# Patient Record
Sex: Male | Born: 1975
Health system: Southern US, Community
[De-identification: ages and names within clinical notes are randomized; demographics above are authoritative.]

## PROBLEM LIST (undated history)

## (undated) DIAGNOSIS — F321 Major depressive disorder, single episode, moderate: Secondary | ICD-10-CM

## (undated) DIAGNOSIS — G4730 Sleep apnea, unspecified: Secondary | ICD-10-CM

## (undated) DIAGNOSIS — E669 Obesity, unspecified: Secondary | ICD-10-CM

## (undated) DIAGNOSIS — G5601 Carpal tunnel syndrome, right upper limb: Secondary | ICD-10-CM

## (undated) DIAGNOSIS — E785 Hyperlipidemia, unspecified: Secondary | ICD-10-CM

## (undated) DIAGNOSIS — K8020 Calculus of gallbladder without cholecystitis without obstruction: Secondary | ICD-10-CM

## (undated) DIAGNOSIS — Z87442 Personal history of urinary calculi: Secondary | ICD-10-CM

## (undated) DIAGNOSIS — G5600 Carpal tunnel syndrome, unspecified upper limb: Secondary | ICD-10-CM

## (undated) HISTORY — PX: LITHOTRIPSY: SUR834

## (undated) HISTORY — PX: COLONOSCOPY: SHX174

## (undated) HISTORY — DX: Sleep apnea, unspecified: G47.30

## (undated) HISTORY — DX: Hyperlipidemia, unspecified: E78.5

## (undated) HISTORY — DX: Obesity, unspecified: E66.9

## (undated) HISTORY — PX: TRIGGER FINGER RELEASE: SHX641

## (undated) HISTORY — DX: Carpal tunnel syndrome, unspecified upper limb: G56.00

## (undated) HISTORY — DX: Major depressive disorder, single episode, moderate: F32.1

---
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
## 2005-07-21 ENCOUNTER — Emergency Department: Payer: Self-pay | Admitting: Unknown Physician Specialty

## 2006-09-18 ENCOUNTER — Encounter: Admission: RE | Admit: 2006-09-18 | Discharge: 2006-09-18 | Payer: Self-pay | Admitting: Emergency Medicine

## 2007-03-15 ENCOUNTER — Encounter: Payer: Self-pay | Admitting: Family Medicine

## 2007-06-15 ENCOUNTER — Ambulatory Visit: Payer: Self-pay | Admitting: Family Medicine

## 2007-06-15 DIAGNOSIS — Z87442 Personal history of urinary calculi: Secondary | ICD-10-CM

## 2007-06-15 LAB — CONVERTED CEMR LAB
Cholesterol: 203 mg/dL (ref 0–200)
HDL: 42.8 mg/dL (ref >39.0)
Total CHOL/HDL Ratio: 4.7
Triglycerides: 82 mg/dL (ref 0–149)

## 2008-06-13 ENCOUNTER — Ambulatory Visit: Payer: Self-pay | Admitting: Family Medicine

## 2008-06-29 ENCOUNTER — Ambulatory Visit: Payer: Self-pay | Admitting: Family Medicine

## 2008-06-29 LAB — CONVERTED CEMR LAB
Albumin: 4.4 g/dL (ref 3.5–5.2)
Bilirubin, Direct: 0.1 mg/dL (ref 0.0–0.3)
CO2: 33 meq/L — ABNORMAL HIGH (ref 19–32)
Chloride: 101 meq/L (ref 96–112)
Cholesterol: 187 mg/dL (ref 0–200)
HDL: 38.1 mg/dL — ABNORMAL LOW (ref >39.0)
Total CHOL/HDL Ratio: 4.9
Triglycerides: 71 mg/dL (ref 0–149)

## 2008-11-23 ENCOUNTER — Ambulatory Visit: Payer: Self-pay | Admitting: Family Medicine

## 2008-11-23 DIAGNOSIS — J3089 Other allergic rhinitis: Secondary | ICD-10-CM

## 2008-11-23 DIAGNOSIS — J302 Other seasonal allergic rhinitis: Secondary | ICD-10-CM

## 2008-12-21 ENCOUNTER — Telehealth: Payer: Self-pay | Admitting: Family Medicine

## 2009-01-22 ENCOUNTER — Ambulatory Visit: Payer: Self-pay | Admitting: Family Medicine

## 2009-01-23 ENCOUNTER — Ambulatory Visit: Payer: Self-pay | Admitting: Family Medicine

## 2009-01-23 ENCOUNTER — Ambulatory Visit: Payer: Self-pay | Admitting: Cardiology

## 2009-01-23 LAB — CONVERTED CEMR LAB
Albumin: 3.9 g/dL (ref 3.5–5.2)
Basophils Absolute: 0 10*3/uL (ref 0.0–0.1)
CO2: 30 meq/L (ref 19–32)
Chloride: 109 meq/L (ref 96–112)
Eosinophils Absolute: 0 10*3/uL (ref 0.0–0.7)
Eosinophils Relative: 0.1 % (ref 0.0–5.0)
Glucose, Bld: 108 mg/dL — ABNORMAL HIGH (ref 70–99)
HCT: 40.1 % (ref 39.0–52.0)
Ketones, urine, test strip: NEGATIVE
MCHC: 35.4 g/dL (ref 30.0–36.0)
Monocytes Relative: 12.7 % — ABNORMAL HIGH (ref 3.0–12.0)
Neutro Abs: 8 10*3/uL — ABNORMAL HIGH (ref 1.4–7.7)
Neutrophils Relative %: 81 % — ABNORMAL HIGH (ref 43.0–77.0)
Protein, U semiquant: >=300
Rapid Strep: NEGATIVE
Total Protein: 6.9 g/dL (ref 6.0–8.3)
pH: 6

## 2009-01-24 ENCOUNTER — Encounter: Payer: Self-pay | Admitting: Family Medicine

## 2009-04-07 ENCOUNTER — Encounter: Payer: Self-pay | Admitting: Family Medicine

## 2009-06-12 ENCOUNTER — Ambulatory Visit: Payer: Self-pay | Admitting: Family Medicine

## 2009-07-15 ENCOUNTER — Ambulatory Visit: Payer: Self-pay | Admitting: Family Medicine

## 2009-07-15 LAB — CONVERTED CEMR LAB
Bilirubin Urine: NEGATIVE
Casts: 0 /lpf
Glucose, Urine, Semiquant: NEGATIVE
Ketones, urine, test strip: NEGATIVE
Nitrite: NEGATIVE
Specific Gravity, Urine: 1.02
Urobilinogen, UA: 0.2
WBC Urine, dipstick: NEGATIVE
WBC, UA: 0 cells/hpf

## 2009-07-16 ENCOUNTER — Ambulatory Visit: Payer: Self-pay | Admitting: Family Medicine

## 2009-07-18 LAB — CONVERTED CEMR LAB
ALT: 33 units/L (ref 0–53)
BUN: 14 mg/dL (ref 6–23)
Basophils Absolute: 0 10*3/uL (ref 0.0–0.1)
CO2: 33 meq/L — ABNORMAL HIGH (ref 19–32)
Creatinine, Ser: 0.9 mg/dL (ref 0.4–1.5)
Direct LDL: 157.3 mg/dL
GFR calc non Af Amer: 104.51 mL/min (ref >60)
HCT: 41.6 % (ref 39.0–52.0)
Lymphs Abs: 1.6 10*3/uL (ref 0.7–4.0)
MCHC: 34.4 g/dL (ref 30.0–36.0)
Platelets: 297 10*3/uL (ref 150.0–400.0)
Potassium: 4.2 meq/L (ref 3.5–5.1)
Sodium: 142 meq/L (ref 135–145)
VLDL: 17.6 mg/dL (ref 0.0–40.0)

## 2009-09-12 ENCOUNTER — Telehealth: Payer: Self-pay | Admitting: Family Medicine

## 2009-11-20 ENCOUNTER — Telehealth: Payer: Self-pay | Admitting: Family Medicine

## 2010-05-30 ENCOUNTER — Ambulatory Visit: Payer: Self-pay | Admitting: Family Medicine

## 2010-05-30 ENCOUNTER — Telehealth (INDEPENDENT_AMBULATORY_CARE_PROVIDER_SITE_OTHER): Payer: Self-pay | Admitting: *Deleted

## 2010-05-30 DIAGNOSIS — E7800 Pure hypercholesterolemia, unspecified: Secondary | ICD-10-CM | POA: Insufficient documentation

## 2010-05-30 LAB — CONVERTED CEMR LAB
ALT: 30 units/L (ref 0–53)
BUN: 16 mg/dL (ref 6–23)
Bilirubin, Direct: 0.1 mg/dL (ref 0.0–0.3)
Calcium: 9.6 mg/dL (ref 8.4–10.5)
Chloride: 99 meq/L (ref 96–112)
Cholesterol: 233 mg/dL — ABNORMAL HIGH (ref 0–200)
Creatinine, Ser: 0.9 mg/dL (ref 0.4–1.5)
GFR calc non Af Amer: 103.93 mL/min (ref >60)
Glucose, Bld: 84 mg/dL (ref 70–99)
Potassium: 4 meq/L (ref 3.5–5.1)
Total CHOL/HDL Ratio: 4
Total Protein: 7.4 g/dL (ref 6.0–8.3)
VLDL: 20.8 mg/dL (ref 0.0–40.0)

## 2010-06-04 ENCOUNTER — Encounter (INDEPENDENT_AMBULATORY_CARE_PROVIDER_SITE_OTHER): Payer: Self-pay | Admitting: *Deleted

## 2010-06-09 ENCOUNTER — Encounter: Payer: Self-pay | Admitting: Family Medicine

## 2010-06-13 ENCOUNTER — Ambulatory Visit: Payer: Self-pay | Admitting: Family Medicine

## 2010-06-13 DIAGNOSIS — G4726 Circadian rhythm sleep disorder, shift work type: Secondary | ICD-10-CM

## 2010-06-13 DIAGNOSIS — F331 Major depressive disorder, recurrent, moderate: Secondary | ICD-10-CM

## 2010-06-16 ENCOUNTER — Telehealth: Payer: Self-pay | Admitting: Family Medicine

## 2010-06-18 ENCOUNTER — Encounter: Payer: Self-pay | Admitting: Family Medicine

## 2010-07-18 ENCOUNTER — Ambulatory Visit: Payer: Self-pay | Admitting: Family Medicine

## 2010-08-17 DIAGNOSIS — K8020 Calculus of gallbladder without cholecystitis without obstruction: Secondary | ICD-10-CM

## 2010-08-17 HISTORY — PX: CHOLECYSTECTOMY: SHX55

## 2010-08-17 HISTORY — DX: Calculus of gallbladder without cholecystitis without obstruction: K80.20

## 2010-09-16 ENCOUNTER — Telehealth: Payer: Self-pay | Admitting: Family Medicine

## 2010-09-16 NOTE — Progress Notes (Signed)
----   Converted from flag ---- ---- 05/28/2010 12:23 PM, Kerby Nora MD wrote: CMEt, lipids Dx 272.0  ---- 05/28/2010 11:57 AM, Liane Comber CMA (AAMA) wrote: Lab orders please! Good Morning! This pt is scheduled for cpx labs tomorrow, which labs to draw and dx codes to use? Thanks Tasha ------------------------------

## 2010-09-16 NOTE — Letter (Signed)
Summary: Aaron Malone,LPC,Note   Imported By: Beau Fanny 06/11/2010 09:40:16  _____________________________________________________________________  External Attachment:    Type:   Image     Comment:   External Document  Appended Document: Amil Amen Malone,LPC,Note HAs appt 10/28.Marland Kitchenwill discuss mood then.

## 2010-09-16 NOTE — Progress Notes (Signed)
Summary: refill  Phone Note Refill Request Message from:  Scriptline on November 20, 2009 7:25 AM  Refills Requested: Medication #1:  flonase   Supply Requested: 1 month [facility]   Method Requested: Electronic Initial call taken by: Benny Lennert CMA (AAMA),  November 20, 2009 7:25 AM    New/Updated Medications: FLONASE 50 MCG/ACT SUSP (FLUTICASONE PROPIONATE) 2 sprays per nostril daily Prescriptions: FLONASE 50 MCG/ACT SUSP (FLUTICASONE PROPIONATE) 2 sprays per nostril daily  #1 x 3   Entered and Authorized by:   Kerby Nora MD   Signed by:   Kerby Nora MD on 11/20/2009   Method used:   Electronically to        [facility] [address] [phone]* (retail)       [address], [place]  [address]       Ph: [phone]       Fax: [phone]   RxID:   [id number]

## 2010-09-16 NOTE — Miscellaneous (Signed)
Summary: [facility](flu vaccine)  Clinical Lists Changes  Observations: Added new observation of FLU VAX: Historical([facility]) (06/04/2010 14:10)      Immunization History:  Influenza Immunization History:    Influenza:  historical([facility]) (06/04/2010)

## 2010-09-16 NOTE — Progress Notes (Signed)
Summary: prior Berkley Harvey is needed for provigil  Phone Note From Pharmacy   Caller: [facility] [address] Summary of Call: Prior Berkley Harvey is needed for provigil, form is on your desk. Initial call taken by: Lowella Petties CMA, AAMA,  June 16, 2010 3:52 PM     Appended Document: prior Berkley Harvey is needed for provigil Prior auth given for provigil, advised pharmacy.  Approval letter placed on doctor's desk for signature and scanning.

## 2010-09-16 NOTE — Medication Information (Signed)
Summary: Prior Authorization & Approval for Provigil/[facility]  Prior Authorization & Approval for Provigil[facility]   Imported By: Lanelle Bal 06/26/2010 09:51:05  _____________________________________________________________________  External Attachment:    Type:   Image     Comment:   External Document

## 2010-09-16 NOTE — Assessment & Plan Note (Signed)
Summary: CPX / LFW   Vital Signs:  Patient profile:   35 year old male Height:      67 inches Weight:      192.0 pounds BMI:     30.18 Temp:     99.0 degrees F oral Pulse rate:   80 / minute Pulse rhythm:   regular BP sitting:   110 / 80  (left arm) Cuff size:   regular  Vitals Entered By: Benny Lennert CMA Duncan Dull) (June 13, 2010 9:25 AM)  History of Present Illness: Chief complaint cpx  The patient is here for annual wellness exam and preventative care.     Depression, letter from pshycologist.  Ongoing x 2 years , worse in last 6 months.  Fatigue, occ suicidal thought, inattentive, short temper, anheadonia Denies mania. No anxiety, no panic attacks.  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 isolated given working 7 days on at a time, then off. Contributing to mood issues, but cannot change. Weight gain in last few weeks...6 lbs since last year.  Wife has started back to work.  Has 39 year old.   Interested in citalopram.  Father with depression.  Lipid Management History:      Negative NCEP/ATP III risk factors include male age less than 23 years old and non-tobacco-user status.     Problems Prior to Update: 1)  Pure Hypercholesterolemia  (ICD-272.0) 2)  Abdominal Pain, Left Upper Quadrant  (ICD-789.02) 3)  Abdominal Pain, Left Upper Quadrant  (ICD-789.02) 4)  Allergic Rhinitis  (ICD-477.9) 5)  Healthy Adult Male  (ICD-V70.0) 6)  Nephrolithiasis, Hx of  (ICD-V13.01)  Current Medications (verified): 1)  Indapamide 1.25 Mg Tabs (Indapamide) .... Take 1 Tablet By Mouth Once A Day 2)  Flonase 50 Mcg/act Susp (Fluticasone Propionate) .... 2 Sprays Per Nostril Daily(Prn) 3)  Chlorpheniramine Maleate 12 Mg Cr-Tabs (Chlorpheniramine Maleate) .... As Needed  Allergies (verified): No Known Drug Allergies  Past History:  Past medical, surgical, family and social histories (including risk factors) reviewed, and no changes noted (except as noted below).  Past  Medical History: Nephrolithiasis, hx of, calcium stones, high urine calcium urologist- Dr Dawna Part - (in [facility])  Past Surgical History: Reviewed history from 06/15/2007 and no changes required. lithotripsy 12/2003 and 09/2006 1983 B inguinal hernia repair 1981 trigger thumb  Family History: Reviewed history from 06/15/2007 and no changes required. father age 73 healthy mother age 59 high chol, HTN sister healthy (PCOS) MGM: colon cancer, lung cancer MGF: prostate cancer, CHF PGF: multile myeloma PGM: DM  Social History: Reviewed history from 07/15/2009 and no changes required. 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 CV:  Denies chest pain or discomfort. Resp:  Denies shortness of breath.  Physical Exam  General:  overweight appearing male in NAd  Eyes:  No corneal or conjunctival inflammation noted. EOMI. Perrla. Funduscopic exam benign, without hemorrhages, exudates or papilledema. Vision grossly normal. Ears:  External ear exam shows no significant lesions or deformities.  Otoscopic examination reveals clear canals, tympanic membranes are intact bilaterally without bulging, retraction, inflammation or discharge. Hearing is grossly normal bilaterally. Nose:  External nasal examination shows no deformity or inflammation. Nasal mucosa are  pink and moist without lesions or exudates. Mouth:  Oral mucosa and oropharynx without lesions or exudates.  Teeth in good repair. Neck:  no carotid bruit or thyromegaly no cervical or supraclavicular lymphadenopathy  Lungs:  Normal respiratory effort, chest expands symmetrically. Lungs  are clear to auscultation, no crackles or wheezes. Heart:  Normal rate and regular rhythm. S1 and S2 normal without gallop, murmur, click, rub or other extra sounds. Abdomen:  Bowel sounds positive,abdomen soft and non-tender without masses, organomegaly or hernias noted. Genitalia:  Testes bilaterally descended without nodularity, tenderness or masses. No scrotal masses or lesions. No penis lesions or urethral discharge. Pulses:  R and L posterior tibial pulses are full and equal bilaterally  Extremities:  no edema Skin:  Intact without suspicious lesions or rashes Psych:  Oriented X3, memory intact for recent and remote, not anxious appearing, flat affect, subdued, and withdrawn.     Impression & Recommendations:  Problem # 1:  HEALTHY ADULT MALE (ICD-V70.0) The patient's preventative maintenance and recommended screening tests for an annual wellness exam were reviewed in full today. Brought up to date unless services declined.  Counselled on the importance of diet, exercise, and its role in overall health and mortality. The patient's FH and SH was reviewed, including their home life, tobacco status, and drug and alcohol status.     Problem # 2:  PURE HYPERCHOLESTEROLEMIA (ICD-272.0) Encouraged exercise, weight loss, healthy eating habits. LDL at goal <160.  Problem # 3:  DEPRESSION, MAJOR, MODERATE (ICD-296.22) Poor cotrol. Continue counseling. Start SSRI. Follow up in 1 month. Disucussed SE profile, length of expected treatment and length of time tim med effective.   Problem # 4:  CIRCADIAN RHYTHM SLEEP DISORDER SHIFT WORK TYPE (ICD-327.36) Will try trial of provigil as sleep dysfunction is effecting muitple areas of his life and is contributing to depression.   Complete Medication List: 1)  Indapamide 1.25 Mg Tabs (Indapamide) .... Take 1 tablet by mouth once a day 2)  Flonase 50 Mcg/act Susp (Fluticasone propionate) .... 2 sprays per nostril daily(prn) 3)  Chlorpheniramine  Maleate 12 Mg Cr-tabs (Chlorpheniramine maleate) .... As needed 4)  Citalopram Hydrobromide 20 Mg Tabs (Citalopram hydrobromide) .... Take 1 tablet by mouth once a day 5)  Provigil 200 Mg Tabs (Modafinil) .... One tab by mouth daily as needed shift work  Lipid Assessment/Plan:      Based on NCEP/ATP III, the patient's risk factor category is "0-1 risk factors".  The patient's lipid goals are as follows: Total cholesterol goal is 200; LDL cholesterol goal is 160; HDL cholesterol goal is 40; Triglyceride goal is 150.    Patient Instructions: 1)  Work on [employer] and healthy eating. 2)   Start citalopram 20 mg daily. 3)   Provigil as needed.  4)  Please schedule a follow-up appointment in 1 month mood. Prescriptions: PROVIGIL 200 MG TABS (MODAFINIL) One tab by mouth daily as needed shift work  #30 x 0   Entered and Authorized by:   Kerby Nora MD   Signed by:   Kerby Nora MD on 06/13/2010   Method used:   Print then Give to Patient   RxID:   [id number] CITALOPRAM HYDROBROMIDE 20 MG TABS (CITALOPRAM HYDROBROMIDE) Take 1 tablet by mouth once a day  #30 x 5   Entered and Authorized by:   Kerby Nora MD   Signed by:   Kerby Nora MD on 06/13/2010   Method used:   Electronically to  [facility]* (retail)       [address]       [address] Shipping/mailing       [place], [place]  [address]       Ph: [phone]       Fax: [phone]   RxID:   [id number]    Orders Added: 1)  Est. Patient 18-39 years [01027]    Current Allergies (reviewed today): No known allergies   Last Flu Vaccine:  Historical([facility]) (06/04/2010 2:10:48 PM) Flu Vaccine Next Due:  1 yr

## 2010-09-16 NOTE — Progress Notes (Signed)
Summary: ? Virus  Phone Note Call from Patient Call back at Home Phone [phone]   Caller: Patient Call For: Kerby Nora MD Summary of Call: Patient called and stated that he is having diarrhea, nausea, running a low grade fever, and vomiting some since last night.  Not much vomiting today.  He just feels tired and sluggish.  Wanted to be seen today if possible.  Advised patient that Dr. Ermalene Searing wasn't here today and there are no other appt slots open.  Advised him to rest, hydrate himself with clear liquids-ginger ale, gatorade.  Also advised him not to force himself to eat, his appetite will start to slowly come back as he starts to feel better.  Advised him of the braty diet.  Advised him to go to a near urgent care if symptoms worsen over night, call us back if he doesn't get better and we could possibly see him tomorrow.   Initial call taken by: Linde Gillis CMA Duncan Dull),  September 12, 2009 3:04 PM  Follow-up for Phone Call        agree with plan. healthy 35 yo male. Follow-up by: Hannah Beat MD,  September 12, 2009 3:13 PM

## 2010-09-16 NOTE — Assessment & Plan Note (Signed)
Summary: 1 M F/U MOOD/DLO   Vital Signs:  Patient profile:   35 year old male Height:      67 inches Weight:      195.4 pounds BMI:     30.71 Temp:     98.7 degrees F oral Pulse rate:   80 / minute Pulse rhythm:   regular BP sitting:   120 / 78  (left arm) Cuff size:   regular  Vitals Entered By: Benny Lennert CMA Duncan Dull) (July 18, 2010 9:21 AM)  History of Present Illness: Chief complaint 1 month follow up mood  Depression, now on citalopram 20 mg daily for 1 months. Has noted 30-40 % improvement. Less dark sadness feelings, less suicidal feelings.  Continuing counseling.  No SE to med. Some weight gain.. no exercise.. plans to do weight watchers.  Has not tried Nuvigil for night shift Disorder yet. Hardest days are transition days.  Problems Prior to Update: 1)  Depression, Major, Moderate  (ICD-296.22) 2)  Circadian Rhythm Sleep Disorder Shift Work Type  (ICD-327.36) 3)  Pure Hypercholesterolemia  (ICD-272.0) 4)  Allergic Rhinitis  (ICD-477.9) 5)  Healthy Adult Male  (ICD-V70.0) 6)  Nephrolithiasis, Hx of  (ICD-V13.01)  Current Medications (verified): 1)  Indapamide 1.25 Mg Tabs (Indapamide) .... Take 1 Tablet By Mouth Once A Day 2)  Flonase 50 Mcg/act Susp (Fluticasone Propionate) .... 2 Sprays Per Nostril Daily(Prn) 3)  Chlorpheniramine Maleate 12 Mg Cr-Tabs (Chlorpheniramine Maleate) .... As Needed 4)  Citalopram Hydrobromide 20 Mg Tabs (Citalopram Hydrobromide) .... Take 1 Tablet By Mouth Once A Day 5)  Provigil 200 Mg Tabs (Modafinil) .... One Tab By Mouth Daily As Needed Shift Work  Allergies (verified): No Known Drug Allergies  Past History:  Past medical, surgical, family and social histories (including risk factors) reviewed, and no changes noted (except as noted below).  Past Medical History: Reviewed history from 06/13/2010 and no changes required. Nephrolithiasis, hx of, calcium stones, high urine calcium urologist- Dr Dawna Part - (in  [facility])  Past Surgical History: Reviewed history from 06/15/2007 and no changes required. lithotripsy 12/2003 and 09/2006 1983 B inguinal hernia repair 1981 trigger thumb  Family History: Reviewed history from 06/15/2007 and no changes required. father age 33 healthy mother age 1 high chol, HTN sister healthy (PCOS) MGM: colon cancer, lung cancer MGF: prostate cancer, CHF PGF: multile myeloma PGM: DM  Social History: Reviewed history from 07/15/2009 and no changes required. Occupation: Teacher, early years/pre, [employer], works at night Married x 5 years no kids Never Smoked Alcohol use-yes 6-8 beer/wine, 2 dinks at a time Drug use-no Regular exercise-yes, treadmill 2-3 x per week Diet: fruits and veggies, trying to eat less fried foods, increasing water, lemonade Marital Status: Married Children:  Occupation:   Review of Systems General:  Denies fatigue and fever. CV:  Denies chest pain or discomfort. Resp:  Denies shortness of breath.  Physical Exam  General:  Well-developed,well-nourished,in no acute distress; alert,appropriate and cooperative throughout examination Mouth:  Oral mucosa and oropharynx without lesions or exudates.  Teeth in good repair. Lungs:  Normal respiratory effort, chest expands symmetrically. Lungs are clear to auscultation, no crackles or wheezes. Heart:  Normal rate and regular rhythm. S1 and S2 normal without gallop, murmur, click, rub or other extra sounds. Psych:  Cognition and judgment appear intact. Alert and cooperative with normal attention span and concentration. No apparent delusions, illusions, hallucinations   Impression & Recommendations:  Problem # 1:  DEPRESSION, MAJOR, MODERATE (ICD-296.22) Improved on citalopram...not interested in  increasing dose. Will given this more time. He will follow up as needed.Marland Kitchenor call if increase needed. Continue counseling.  Problem # 2:  CIRCADIAN RHYTHM SLEEP DISORDER SHIFT WORK TYPE  (ICD-327.36) [place]..has not tried nuvigil yet.   Complete Medication List: 1)  Indapamide 1.25 Mg Tabs (Indapamide) .... Take 1 tablet by mouth once a day 2)  Flonase 50 Mcg/act Susp (Fluticasone propionate) .... 2 sprays per nostril daily(prn) 3)  Chlorpheniramine Maleate 12 Mg Cr-tabs (Chlorpheniramine maleate) .... As needed 4)  Citalopram Hydrobromide 20 Mg Tabs (Citalopram hydrobromide) .... Take 1 tablet by mouth once a day 5)  Provigil 200 Mg Tabs (Modafinil) .... One tab by mouth daily as needed shift work  Patient Instructions: 1)  Follow up as needed for mood cahnges. 2)   Continue current dose of citalopram.    Orders Added: 1)  Est. Patient Level III [[address]]    Current Allergies (reviewed today): No known allergies

## 2010-09-24 NOTE — Progress Notes (Signed)
Summary: provigil  Phone Note Refill Request Message from:  Scriptline on September 16, 2010 7:44 AM  Refills Requested: Medication #1:  PROVIGIL 200 MG TABS One tab by mouth daily as needed shift work.   Supply Requested: 1 month [facility] [address]   Method Requested: Telephone to Pharmacy Initial call taken by: Benny Lennert CMA Duncan Dull),  September 16, 2010 7:44 AM  Follow-up for Phone Call        Rx called to pharmacy Follow-up by: Benny Lennert CMA Duncan Dull),  September 16, 2010 10:23 AM    Prescriptions: PROVIGIL 200 MG TABS (MODAFINIL) One tab by mouth daily as needed shift work  #30 x 0   Entered and Authorized by:   Kerby Nora MD   Signed by:   Kerby Nora MD on 09/16/2010   Method used:   Telephoned to ...       [facility] [address]* (retail)       [address]       [place], [place]  [address]       Ph: [phone]       Fax: [phone]   RxID:   [id number]

## 2010-10-19 ENCOUNTER — Emergency Department (HOSPITAL_COMMUNITY): Payer: BC Managed Care – PPO

## 2010-10-19 ENCOUNTER — Emergency Department (HOSPITAL_COMMUNITY)
Admission: EM | Admit: 2010-10-19 | Discharge: 2010-10-19 | Disposition: A | Discharge status: Home / Self Care | Payer: BC Managed Care – PPO | Attending: Emergency Medicine | Admitting: Emergency Medicine

## 2010-10-19 DIAGNOSIS — N200 Calculus of kidney: Secondary | ICD-10-CM | POA: Insufficient documentation

## 2010-10-19 DIAGNOSIS — R109 Unspecified abdominal pain: Secondary | ICD-10-CM | POA: Insufficient documentation

## 2010-10-19 DIAGNOSIS — R7309 Other abnormal glucose: Secondary | ICD-10-CM | POA: Insufficient documentation

## 2010-10-19 LAB — URINALYSIS, ROUTINE W REFLEX MICROSCOPIC
Ketones, ur: NEGATIVE mg/dL
Nitrite: NEGATIVE
Protein, ur: NEGATIVE mg/dL

## 2010-10-19 LAB — POCT CARDIAC MARKERS
CKMB, poc: 1.3 ng/mL (ref 1.0–8.0)
Myoglobin, poc: 129 ng/mL (ref 12–200)

## 2010-10-19 LAB — DIFFERENTIAL
Basophils Relative: 0 % (ref 0–1)
Eosinophils Absolute: 0 10*3/uL (ref 0.0–0.7)
Eosinophils Relative: 0 % (ref 0–5)
Lymphocytes Relative: 10 % — ABNORMAL LOW (ref 12–46)
Neutrophils Relative %: 87 % — ABNORMAL HIGH (ref 43–77)

## 2010-10-19 LAB — BASIC METABOLIC PANEL
BUN: 13 mg/dL (ref 6–23)
GFR calc non Af Amer: >60 mL/min (ref >60)
Glucose, Bld: 149 mg/dL — ABNORMAL HIGH (ref 70–99)

## 2010-10-19 LAB — CBC
HCT: 41.9 % (ref 39.0–52.0)
MCHC: 34.6 g/dL (ref 30.0–36.0)
MCV: 92.1 fL (ref 78.0–100.0)
Platelets: 357 10*3/uL (ref 150–400)

## 2010-10-19 LAB — URINE MICROSCOPIC-ADD ON

## 2010-10-20 ENCOUNTER — Other Ambulatory Visit: Payer: Self-pay | Admitting: Family Medicine

## 2010-10-20 ENCOUNTER — Ambulatory Visit (INDEPENDENT_AMBULATORY_CARE_PROVIDER_SITE_OTHER): Payer: BC Managed Care – PPO | Admitting: Family Medicine

## 2010-10-20 ENCOUNTER — Encounter: Payer: Self-pay | Admitting: Family Medicine

## 2010-10-20 DIAGNOSIS — R1011 Right upper quadrant pain: Secondary | ICD-10-CM

## 2010-10-20 DIAGNOSIS — K8000 Calculus of gallbladder with acute cholecystitis without obstruction: Secondary | ICD-10-CM | POA: Insufficient documentation

## 2010-10-20 LAB — CBC WITH DIFFERENTIAL/PLATELET
Eosinophils Absolute: 0.1 10*3/uL (ref 0.0–0.7)
HCT: 38.4 % — ABNORMAL LOW (ref 39.0–52.0)
Lymphocytes Relative: 14.7 % (ref 12.0–46.0)
Lymphs Abs: 1.8 10*3/uL (ref 0.7–4.0)
Monocytes Relative: 9.1 % (ref 3.0–12.0)
Neutrophils Relative %: 74.6 % (ref 43.0–77.0)
RBC: 4.09 Mil/uL — ABNORMAL LOW (ref 4.22–5.81)
WBC: 12 10*3/uL — ABNORMAL HIGH (ref 4.5–10.5)

## 2010-10-20 LAB — HEPATIC FUNCTION PANEL
AST: 20 U/L (ref 0–37)
Alkaline Phosphatase: 58 U/L (ref 39–117)
Bilirubin, Direct: 0.1 mg/dL (ref 0.0–0.3)
Total Bilirubin: 0.8 mg/dL (ref 0.3–1.2)
Total Protein: 6.8 g/dL (ref 6.0–8.3)

## 2010-10-21 ENCOUNTER — Ambulatory Visit
Admission: RE | Admit: 2010-10-21 | Discharge: 2010-10-21 | Disposition: A | Discharge status: Home / Self Care | Payer: BC Managed Care – PPO | Source: Ambulatory Visit | Attending: Family Medicine | Admitting: Family Medicine

## 2010-10-21 ENCOUNTER — Other Ambulatory Visit: Payer: Self-pay | Admitting: Family Medicine

## 2010-10-21 DIAGNOSIS — R1011 Right upper quadrant pain: Secondary | ICD-10-CM

## 2010-10-26 ENCOUNTER — Inpatient Hospital Stay (HOSPITAL_COMMUNITY)
Admission: EM | Admit: 2010-10-26 | Discharge: 2010-10-29 | DRG: 494 | Disposition: A | Discharge status: Home / Self Care | Payer: BC Managed Care – PPO | Attending: Surgery | Admitting: Surgery

## 2010-10-26 DIAGNOSIS — F3289 Other specified depressive episodes: Secondary | ICD-10-CM | POA: Diagnosis present

## 2010-10-26 DIAGNOSIS — R339 Retention of urine, unspecified: Secondary | ICD-10-CM | POA: Diagnosis not present

## 2010-10-26 DIAGNOSIS — Z79899 Other long term (current) drug therapy: Secondary | ICD-10-CM

## 2010-10-26 DIAGNOSIS — K8000 Calculus of gallbladder with acute cholecystitis without obstruction: Principal | ICD-10-CM | POA: Diagnosis present

## 2010-10-26 DIAGNOSIS — K8010 Calculus of gallbladder with chronic cholecystitis without obstruction: Secondary | ICD-10-CM | POA: Diagnosis present

## 2010-10-26 DIAGNOSIS — F329 Major depressive disorder, single episode, unspecified: Secondary | ICD-10-CM | POA: Diagnosis present

## 2010-10-26 DIAGNOSIS — Z87442 Personal history of urinary calculi: Secondary | ICD-10-CM

## 2010-10-26 LAB — POCT CARDIAC MARKERS
CKMB, poc: <1 ng/mL — ABNORMAL LOW (ref 1.0–8.0)
Myoglobin, poc: 113 ng/mL (ref 12–200)
Troponin i, poc: <0.05 ng/mL (ref 0.00–0.09)

## 2010-10-26 LAB — COMPREHENSIVE METABOLIC PANEL
ALT: 25 U/L (ref 0–53)
Albumin: 3.8 g/dL (ref 3.5–5.2)
CO2: 30 mEq/L (ref 19–32)
Calcium: 9.6 mg/dL (ref 8.4–10.5)
GFR calc Af Amer: >60 mL/min (ref >60)
Potassium: 3.7 mEq/L (ref 3.5–5.1)
Sodium: 138 mEq/L (ref 135–145)
Total Bilirubin: 0.7 mg/dL (ref 0.3–1.2)

## 2010-10-26 LAB — DIFFERENTIAL
Basophils Absolute: 0.1 10*3/uL (ref 0.0–0.1)
Basophils Relative: 0 % (ref 0–1)
Eosinophils Relative: 3 % (ref 0–5)
Lymphs Abs: 1.1 10*3/uL (ref 0.7–4.0)
Monocytes Relative: 9 % (ref 3–12)
Neutro Abs: 10.5 10*3/uL — ABNORMAL HIGH (ref 1.7–7.7)

## 2010-10-26 LAB — CBC
MCH: 31.1 pg (ref 26.0–34.0)
MCV: 91.4 fL (ref 78.0–100.0)
RBC: 4.4 MIL/uL (ref 4.22–5.81)

## 2010-10-27 ENCOUNTER — Telehealth: Payer: Self-pay | Admitting: Family Medicine

## 2010-10-27 ENCOUNTER — Other Ambulatory Visit: Payer: Self-pay | Admitting: Surgery

## 2010-10-27 ENCOUNTER — Inpatient Hospital Stay (HOSPITAL_COMMUNITY): Payer: BC Managed Care – PPO

## 2010-10-27 LAB — COMPREHENSIVE METABOLIC PANEL
ALT: 87 U/L — ABNORMAL HIGH (ref 0–53)
AST: 118 U/L — ABNORMAL HIGH (ref 0–37)
Alkaline Phosphatase: 87 U/L (ref 39–117)
CO2: 30 mEq/L (ref 19–32)
Chloride: 98 mEq/L (ref 96–112)
GFR calc Af Amer: >60 mL/min (ref >60)
GFR calc non Af Amer: >60 mL/min (ref >60)
Sodium: 134 mEq/L — ABNORMAL LOW (ref 135–145)
Total Bilirubin: 1.2 mg/dL (ref 0.3–1.2)

## 2010-10-27 LAB — CBC
MCH: 30.8 pg (ref 26.0–34.0)
MCHC: 33.2 g/dL (ref 30.0–36.0)
MCV: 92.9 fL (ref 78.0–100.0)
Platelets: 366 10*3/uL (ref 150–400)
RDW: 11.9 % (ref 11.5–15.5)
WBC: 11.3 10*3/uL — ABNORMAL HIGH (ref 4.0–10.5)

## 2010-10-28 LAB — COMPREHENSIVE METABOLIC PANEL
AST: 48 U/L — ABNORMAL HIGH (ref 0–37)
Albumin: 3.1 g/dL — ABNORMAL LOW (ref 3.5–5.2)
Calcium: 8.8 mg/dL (ref 8.4–10.5)
Creatinine, Ser: 0.75 mg/dL (ref 0.4–1.5)
GFR calc Af Amer: >60 mL/min (ref >60)
GFR calc non Af Amer: >60 mL/min (ref >60)

## 2010-10-28 LAB — CBC
MCH: 30.8 pg (ref 26.0–34.0)
MCHC: 32.9 g/dL (ref 30.0–36.0)
Platelets: 365 10*3/uL (ref 150–400)

## 2010-10-28 NOTE — Assessment & Plan Note (Signed)
Summary: F/U [facility] ER ON 10/19/10/CLE  ABD PAIN  [employer] PT REFUSED T...   Vital Signs:  Patient profile:   35 year old male Weight:      193.75 pounds Temp:     98.4 degrees F oral Pulse rate:   88 / minute Pulse rhythm:   regular BP sitting:   110 / 80  (left arm) Cuff size:   large  Vitals Entered By: Selena Batten Dance CMA Duncan Dull) (October 20, 2010 9:48 AM) CC: ER follow up   History of Present Illness: CC: ER f/u  seen yesterday at ER with LUQ pain, resolved.  CT scan done, normal.  Told had R kidney stones, nonobstructing.  WLER.  records reviewed.  white count was a bit elevated.  Now this morning awoke with RUQ pain worse with deep breaths.  Also positional.  stabbing pain.  low grade fever last night to 100.6, resolved with ibuprofen.  + vomited in ER.  Zantac and gasx didn't help pain.  Had lortab from another kidney stone, threw it up.  no radiation.  No pain epigastric, no burning, no h/o reflux.  No diarrhea, or constipation, no blood or green in ER.    + stressful work, found out job has been eliminated, in limbo currently at work.  Hasn't eaten since saturday night.  Last night took pain meds and slept.  Had dilaudid in ER.    allergies acting up, has had drainage recently as well as throat irritation and mild cough.  no rec drugs.  rare EtOH.  Current Medications (verified): 1)  Indapamide 1.25 Mg Tabs (Indapamide) .... Take 1 Tablet By Mouth Once A Day 2)  Flonase 50 Mcg/act Susp (Fluticasone Propionate) .... 2 Sprays Per Nostril Daily(Prn) 3)  Chlorpheniramine Maleate 12 Mg Cr-Tabs (Chlorpheniramine Maleate) .... As Needed 4)  Citalopram Hydrobromide 20 Mg Tabs (Citalopram Hydrobromide) .... Take 1 Tablet By Mouth Once A Day 5)  Provigil 200 Mg Tabs (Modafinil) .... One Tab By Mouth Daily As Needed Shift Work  Allergies (verified): No Known Drug Allergies  Past History:  Past Medical History: Last updated: 06/13/2010 Nephrolithiasis, hx of, calcium stones, high  urine calcium urologist- Dr Dawna Part - (in [facility])  Past Surgical History: Last updated: 06/15/2007 lithotripsy 12/2003 and 09/2006 1983 B inguinal hernia repair 1981 trigger thumb  Social History: Last updated: 07/15/2009 Occupation: pharmacist, [employer], works at night Married x 5 years no kids Never Smoked Alcohol use-yes 6-8 beer/wine, 2 dinks at a time Drug use-no Regular exercise-yes, treadmill 2-3 x per week Diet: fruits and veggies, trying to eat less fried foods, increasing water, lemonade Marital Status: Married Children:  Occupation:   Review of Systems       per HPI  Physical Exam  General:  Well-developed,well-nourished,in no acute distress; alert,appropriate and cooperative throughout examination Mouth:  Oral mucosa and oropharynx without lesions or exudates.  Teeth in good repair. Lungs:  Normal respiratory effort, chest expands symmetrically. Lungs are clear to auscultation, no crackles or wheezes. Heart:  Normal rate and regular rhythm. S1 and S2 normal without gallop, murmur, click, rub or other extra sounds. Abdomen:  Bowel sounds positive,abdomen soft and without masses, organomegaly or hernias noted.  no CVA tenderness.  mild tenderness to deep RUQ palpation with deep inspiration.  no pain with palpation of ribs or chest wall R side. Msk:  No deformity or scoliosis noted of thoracic or lumbar spine.   Pulses:  2+ rad pulses, brisk cap refill Extremities:  no  edema Skin:  Intact without suspicious lesions or rashes   Impression & Recommendations:  Problem # 1:  RUQ PAIN (ICD-789.01) Assessment New started as LUQ pain, now RUQ tenderness.  CT at ER WNL, normal gallbladder and liver.  R nonobstructing stones.  given RUQ pain currently, check LFTs (not done at er) and rpt CBC as had left shift at ER.  unclear etiology.  if normal, consider treating as msk pain with flexeril.  If abnl, consider RUQ [place] to further eval colic/gallstones.  red flags to  return/go to Er discussed.  Orders: Venipuncture (81191) TLB-Hepatic/Liver Function Pnl (80076-HEPATIC) TLB-CBC Platelet - w/Differential (85025-CBCD)  Complete Medication List: 1)  Indapamide 1.25 Mg Tabs (Indapamide) .... Take 1 tablet by mouth once a day 2)  Flonase 50 Mcg/act Susp (Fluticasone propionate) .... 2 sprays per nostril daily(prn) 3)  Chlorpheniramine Maleate 12 Mg Cr-tabs (Chlorpheniramine maleate) .... As needed 4)  Citalopram Hydrobromide 20 Mg Tabs (Citalopram hydrobromide) .... Take 1 tablet by mouth once a day 5)  Provigil 200 Mg Tabs (Modafinil) .... One tab by mouth daily as needed shift work  Patient Instructions: 1)  we will check blood work today to evaluate liver function. 2)  We will keep an eye on this for now.  If worsening pain or fever continues or worsening nausea/vomiting, please return or seek medical care. 3)  Good to see you today, call clinic with questions.   Orders Added: 1)  Venipuncture [36415] 2)  TLB-Hepatic/Liver Function Pnl [80076-HEPATIC] 3)  TLB-CBC Platelet - w/Differential [85025-CBCD] 4)  Est. Patient Level III [47829]    Current Allergies (reviewed today): No known allergies

## 2010-10-31 NOTE — Discharge Summary (Signed)
NAMEJACQUISE, RARICK               ACCOUNT NO.:  [id number]  MEDICAL RECORD NO.:  [id number]           PATIENT TYPE:  I  LOCATION:  1526                         FACILITY:  [facility]  PHYSICIAN:  Maayan Jenning A. Colten Desroches, M.D.DATE OF BIRTH:  [birth year]  DATE OF ADMISSION:  10/26/2010 DATE OF DISCHARGE:  10/29/2010                              DISCHARGE SUMMARY   ADMITTING PHYSICIAN:  Mary Sella. Andrey Campanile, M.D.  DISCHARGING PHYSICIAN:  Clovis Pu. Seville Downs, M.D.  CONSULTANTS:  None.  PROCEDURES:  Laparoscopic cholecystectomy with intraoperative cholangiogram by Dr. Luisa Hart on October 27, 2010.  REASON FOR ADMISSION:  Mr. Aaron Malone is a 35 year old male who has had persistent right upper quadrant abdominal pain since 4:00 a.m. the morning prior to admission.  The patient developed epigastric abdominal pain and right upper quadrant pain at work earlier.  He came to the emergency department due to persistent pain and nausea.  He had further workup with an ultrasound that revealed gallstones and gallbladder wall thickening of 3 murmur, no pericholecystic fluid.  At this time, the patient was admitted.  Please see admitting history and physical for further details.  ADMITTING DIAGNOSES: 1. Symptomatic cholelithiasis, probable acute cholecystitis. 2. Depression. 3. History of nephrolithiasis.  HOSPITAL COURSE:  At this time, the patient was admitted and placed on IV fluids.  The following day, the patient was taken to the operating room where he was found to have acute on chronic cholecystitis.  An intraoperative cholangiogram was obtained which was normal.  A 19-French JP drain was left in place secondary to how difficult the gallbladder was to remove.  Postoperatively, the patient did well.  He did have urinary retention on postoperative day #1, and a Foley catheter was placed.  However, by postoperative day #2, this had been discontinued and he was urinating well without any difficulties.  His diet  was advanced as tolerated postoperatively, and by postoperative day #2, he was also tolerating regular diet and taking p.o. pain medicine for control of this pain.  At this time, the patient was felt stable for discharge home.  DISCHARGE DIAGNOSES: 1. Acute on chronic cholecystitis. 2. Urinary retention which is resolved.  DISCHARGE MEDICATIONS:  Please see medication reconciliation form.  DISCHARGE INSTRUCTIONS:  The patient may increase his activity slowly and walk up steps.  He is not to bathe but he may shower.  He is not to lift anything greater than 15 pounds for the next 2 weeks.  He is not to drive for the next 3-4 days.  He has no dietary restrictions.  He is to empty and record his JP drain output and bring it with him to his followup appointment.  He is to return to see the [facility] next week November 04, 2010, at 3:00 p.m. for drain check and removal.  He is to call our office for fever greater than 101.5 or worsening abdominal pain.     Letha Cape, PA   ______________________________ Clovis Pu Madisynn Plair, M.D.    KEO/MEDQ  D:  10/29/2010  T:  10/29/2010  Job:  [id number]  Electronically Signed by Barnetta Chapel PA on 10/29/2010 01:40:36  PM Electronically Signed by Harriette Bouillon M.D. on 10/31/2010 09:20:40 AM

## 2010-10-31 NOTE — Op Note (Signed)
NAMESUTTER, AHLGREN               ACCOUNT NO.:  [id number]  MEDICAL RECORD NO.:  [id number]           PATIENT TYPE:  I  LOCATION:  1526                         FACILITY:  [facility]  PHYSICIAN:  Itha Kroeker A. Iridian Reader, M.D.DATE OF BIRTH:  15-Aug-1976  DATE OF PROCEDURE:  10/27/2010 DATE OF DISCHARGE:                              OPERATIVE REPORT   PREOPERATIVE DIAGNOSIS:  Acute cholecystitis.  POSTOPERATIVE DIAGNOSIS:  Acute on chronic cholecystitis.  PROCEDURE:  Laparoscopic cholecystectomy with intraoperative cholangiogram.  SURGEON:  Jonell Krontz A. Mehul Rudin, MD  ANESTHESIA:  General endotracheal anesthesia with 0.25% Sensorcaine local.  ESTIMATED BLOOD LOSS:  100 cc.  SPECIMEN:  Gallbladder to Pathology.  DRAIN:  19 round Blake drain to gallbladder fossa.  INDICATIONS FOR PROCEDURE:  The patient is a 35 year old male who has had intermittent symptoms of biliary colic and actually was admitted last night with what sounds like acute cholecystitis by Dr. Gaynelle Adu. Upon examining and reviewing his chart, I agreed he had acute cholecystitis, recommended laparoscopic cholecystectomy with cholangiogram to do as an operative procedure of choice.  Medical options and nonsurgical options in this setting were not appropriate and I discussed all that as well as complications of procedure of bleeding, infection, common bile duct injury, injury to other structures to be inclusive and exclusive of the duodenum, pancreas, stomach, liver, common bile duct, intrahepatic bile ducts, colon, small bowel, abdominal wall, and diaphragm.  He voiced understanding and agreed to proceed.  DESCRIPTION OF PROCEDURE:  The patient was brought to the operating room.  After induction of general anesthesia, the abdomen was prepped in sterile fashion.  Time-out was done.  He was already on Zosyn preoperatively.  1 cm infraumbilical incision was made.  Dissection was carried down to fascia.  Fascia was opened in  midline.  The abdominal cavity was entered.  Pursestring suture of 0 Vicryl was placed and a 12- mm Hasson cannula was placed under direct vision.  Pneumoperitoneum was created to 15 mmHg with CO2, laparoscope was placed.  He was placed in reverse Trendelenburg and then rolled to his left.  Four-quadrant laparoscopy performed, which showed no significant abnormality.  Upon examination of the gallbladder, it was acutely inflamed and stuck to the greater omentum.  11 mm subxiphoid was then placed and two 5 mm ports were placed under direct vision.  Gallbladder was then identified.  It was decompressed using the Nezhat needle suction device.  I was then able to grab the dome and retracted to the patient's right shoulder.  He had severe acute inflammatory change to it.  The omentum peeled away without much difficulty.  Infundibulum was identified.  There was significant edema.  I was able to score the peritoneal covering at the neck of the gallbladder to identify the cystic duct.  Clips were placed on the gallbladder side of this.  A small incision was made in the cystic duct.  Through a separate stab incision, a Cook cholangiogram catheter was introduced, placed and the cystic duct held in place by single clip.  Intraoperative cholangiogram was performed using one-half strength Hypaque dye with a nondilated common duct with free  flow into the duodenum.  Long cystic duct noted as well as common hepatic ducts and right and left hepatic ducts, which were normal.  We then removed the catheter and placed 3 clips across the cystic duct stump and divided.  There was cystic artery, it was controlled between clips and divided.  Posterior branch of cystic artery was divided between clips. There was a large what appeared to be replaced right hepatic artery as we carefully dissected around this.  Small branches from this were controlled with clips.  I was then able to dissect the gallbladder off the  gallbladder fossa.  There was significant inflammatory change and edema at the gallbladder bed.  Gallbladder was then placed in EndoCatch bag.  Gallbladder bed was examined and the cautery and Surgicel were used for excellent hemostasis.  Clips were on the cystic duct and cystic artery respectively as well as the small branches that appeared to be a replaced right hepatic artery.  Irrigation of gallbladder bed was done. Suction was done.  There was no evidence of any bile leakage.  Upon close inspection of the gallbladder bed, I saw no leakage of bile from this or bleeding from this.  Surgicel was also in place at this point in time and then replaced.  We then placed a 19 round Blake drain through his most lateral port site and put it in the gallbladder bed.  It was secured to skin with 2-0 nylon.  I then suctioned out any excess irrigation or bleeding and there was evidence of bleeding or bile leakage.  We then extracted the gallbladder through the umbilical port with the help of an EndoCatch bag.  It was passed off the field.  Fascia was closed here with 0 Vicryl.  Reinspection of the abdominal cavity revealed no injury to bowel or solid organ.  Excess irrigation was suctioned out in all 4 quadrants.  We then removed the ports with no signs of port site bleeding, passed them off the field.  I closed the skin with 4-0 Monocryl and Dermabond.  All final counts of sponge, needle and instrument were found be correct at this portion of the case. All final counts were found to be correct x2.  He was awoken and taken to Recovery in satisfactory condition.     Courtney Fenlon A. Jeanifer Halliday, M.D.     TAC/MEDQ  D:  10/27/2010  T:  10/27/2010  Job:  [id number]  Electronically Signed by Harriette Bouillon M.D. on 10/31/2010 09:20:32 AM

## 2010-11-04 NOTE — Progress Notes (Signed)
Summary: call a nurse  Phone Note Call from Patient   Call For: Kerby Nora MD Summary of Call: Triage Record Num: [id number] Operator: Arline Asp Loftin Patient Name: Alfonzia Woolum Call Date & Time: 10/26/2010 4:37:58PM Patient Phone: [phone] PCP: Kerby Nora Patient Gender: Male PCP Fax : [phone] Patient DOB: [birth year] Practice Name: [facility] Reason for Call: Linden Dolin. Follow up to previous call. Has scheduled consult with surgeon on 03/19. Abdominal pain is currently 7 out of 10. Is taking Motrin 600mg  TID. Pain is described as unbearable, and is disrupting his sleep/activities. Advised to go to [facility] ED for immediate evaluation. Call back parameters reviewed. Protocol(s) Used: Abdominal Pain Recommended Outcome per Protocol: See ED Immediately Reason for Outcome: Unbearable abdominal/pelvic pain Care Advice:  ~ Another adult should drive.  ~ Pain medication or laxatives should not be taken until symptoms are evaluated.  ~ Do not eat or drink anything until evaluated by provider. Write down provider's name. List or place the following in a bag for transport with the patient: current prescription and/or nonprescription medications; alternative treatments, therapies and medications; and street drugs.  ~ 03 Initial call taken by: Melody Comas,  October 27, 2010 8:31 AM

## 2010-11-06 ENCOUNTER — Encounter: Payer: Self-pay | Admitting: Family Medicine

## 2010-11-07 ENCOUNTER — Ambulatory Visit (INDEPENDENT_AMBULATORY_CARE_PROVIDER_SITE_OTHER)
Admission: RE | Admit: 2010-11-07 | Discharge: 2010-11-07 | Disposition: A | Discharge status: Home / Self Care | Payer: BC Managed Care – PPO | Source: Ambulatory Visit | Attending: Family Medicine | Admitting: Family Medicine

## 2010-11-07 ENCOUNTER — Ambulatory Visit (INDEPENDENT_AMBULATORY_CARE_PROVIDER_SITE_OTHER): Payer: BC Managed Care – PPO | Admitting: Family Medicine

## 2010-11-07 ENCOUNTER — Encounter: Payer: Self-pay | Admitting: Family Medicine

## 2010-11-07 VITALS — BP 120/80 | HR 80 | Temp 98.1°F | Wt 190.1 lb

## 2010-11-07 DIAGNOSIS — J8410 Pulmonary fibrosis, unspecified: Secondary | ICD-10-CM

## 2010-11-07 DIAGNOSIS — R1011 Right upper quadrant pain: Secondary | ICD-10-CM

## 2010-11-07 NOTE — Patient Instructions (Addendum)
Chest xray today showing a few granulomas, this could have been scarring from previous infection, possibly even histo given you grew up in [place] (endemic). As you are healthy now, no further treatment would be necessary.  Blood work to check on histoplasmosis checked today. Good to see you today, call clinic with questions.

## 2010-11-07 NOTE — Progress Notes (Signed)
  Subjective:    Patient ID: Aaron Malone, male    DOB: 1976-03-29, 35 y.o.   MRN: [id number]  HPI CC: hospital f/u  Seen as ER f/u on 3/5 with RUQ pain after normal abd/pelvic CT.  Abd US obtained, showed mult gallstones and upper limit of normal wall thickness.  Scheduled for surg eval 3/19.  However, admitted 10/27/2010 - acute on chronic cholecystitis s/p cholecystectomy.  Had drain placed for 8 days.  Drain pulled out 3/20.  Had some abd soreness for a few days afterwards.  Complicated by urinary retention s/p foley (now removed) and flomax attributed to narcotics.  Actually has done well since hospitalization.  No fevers, chills.  Slowly picking up activity.  Out of work until early April.  Already released by surgery.  Granulomas in lungs - incidental finding on Abd/pelvic CT.  worried about histoplasmosis, sarcoidosis.  Wants this checked out.  Has not had recent travel outside of country.  No recent travel to [place] or [place].  No exposure to bat or bird droppings that he knows of.  Did grow up in [place].  Did have dog die of histoplasmosis as child.  No cough, SOB.  No weight changes, appetite ok.  No h/o TB.  No recent fevers/chills, abd pain.  No nausea/vomiting.  No diarrhea/constipation.  Review of Systems per HPI    Objective:   Physical Exam  Vitals reviewed. Constitutional: He appears well-developed and well-nourished. No distress.  HENT:  Head: Normocephalic and atraumatic.  Nose: Nose normal.  Mouth/Throat: Oropharynx is clear and moist. No oropharyngeal exudate.  Eyes: Conjunctivae and EOM are normal. Pupils are equal, round, and reactive to light. No scleral icterus.  Neck: Normal range of motion. Neck supple. No thyromegaly present.  Cardiovascular: Normal rate, regular rhythm, normal heart sounds and intact distal pulses.  Exam reveals no gallop.   No murmur heard. Pulmonary/Chest: Effort normal and breath sounds normal. No respiratory distress. He has  no wheezes. He has no rales.  Abdominal: Soft. Bowel sounds are normal. He exhibits no distension and no mass. There is no hepatosplenomegaly. There is tenderness. There is no rebound, no guarding and no CVA tenderness.       Minimal tenderness RUQ.  Lymphadenopathy:    He has no cervical adenopathy.  Skin: Skin is warm and dry. No rash noted.       Drain incision with slight erythema surrounding, no discharge.  O/w incisions well approximated, healing well.  No erythema/discharge.          Assessment & Plan:

## 2010-11-08 ENCOUNTER — Encounter: Payer: Self-pay | Admitting: Family Medicine

## 2010-11-08 DIAGNOSIS — J8410 Pulmonary fibrosis, unspecified: Secondary | ICD-10-CM | POA: Insufficient documentation

## 2010-11-08 NOTE — Assessment & Plan Note (Signed)
Incidental on recent CT scan at ER (10/19/2010). Recent CBC without eosinophilia. Doubt current infection or acute process causing this. Follow with CXR today - some granulmoas, R diaphragm elevated.  No hilar LAD on my read.  Await rad read. Discussed given healthy young adult, not immunocompromised, would not treat anyways. Could have had histoplasmosis as child (grew up in [place]), anticipate cleared though.  Discussed blood test, and how it would not change plan of care.  Pt requests to be tested regardless. Watch for red flags of weight loss, recurrent fevers, cough, etc.

## 2010-11-08 NOTE — Assessment & Plan Note (Signed)
S/p cholecystectomy.  Healing well.  Anticipate slow recovery.

## 2010-11-18 ENCOUNTER — Telehealth: Payer: Self-pay | Admitting: Family Medicine

## 2010-11-18 NOTE — Telephone Encounter (Signed)
Called patient to discuss results.  Negative histoplasmosis antibody.  Although I did say I do think this is likely residual from prior histo infection (grew up in [place]).  Pt states family reassured.

## 2010-11-20 ENCOUNTER — Encounter: Payer: Self-pay | Admitting: Family Medicine

## 2010-11-20 NOTE — H&P (Signed)
Aaron Malone, Aaron Malone               ACCOUNT NO.:  [id number]  MEDICAL RECORD NO.:  [id number]           PATIENT TYPE:  I  LOCATION:  1526                         FACILITY:  [facility]  PHYSICIAN:  Mary Sella. Andrey Campanile, MD     DATE OF BIRTH:  1976-04-18  DATE OF ADMISSION:  10/26/2010 DATE OF DISCHARGE:                             HISTORY & PHYSICAL   REASON FOR ADMISSION:  Symptomatic cholelithiasis, possible acute cholecystitis.  REFERRING PHYSICIAN:  Orlene Och, M.D.  PRIMARY CARE PHYSICIAN:  Kerby Nora, M.D.  HISTORY OF PRESENT ILLNESS:  Mr. Aaron Malone is a very pleasant 35 year old Caucasian male who has had persistent upper abdominal and right-sided pain since around 4:00 a.m.  He was at work when he developed epigastric and right upper quadrant pain.  He took some antiemetics in order to prevent him from becoming nauseous.  He went home after his shift and went to bed; however, he awoke with worsening right upper quadrant epigastric pain around noon.  The pain persisted despite taking some ibuprofen.  He came to the emergency room for further evaluation.  He describes the pain as dull and crampy.  It is mainly in his right upper quadrant and epigastric area.  He did have 1 episode of emesis after arriving to the emergency room.  He does have some nausea. Interestingly, he was in ER 1 week ago with left flank pain for which he thought he had a kidney stone because he has history kidney stones.  A CT scan showed nonobstructing right renal calculi.  Because he had some persistent abdominal discomfort, he had an ultrasound on Monday which showed numerous gallstones as well as wall thickness of upper limits of normal.  He was actually scheduled to see Dr. Dwain Sarna in about a week and a half to discuss cholecystectomy.  He reports that he was doing all right Tuesday through Friday until he developed recurrent abdominal pain early this morning at work.  He denies any jaundice.  He had a  bowel movement earlier today.  He states that he has lost a couple of pounds this week.  PAST MEDICAL HISTORY: 1. Depression. 2. History of nephrolithiasis. 3. Cholelithiasis.  PAST SURGICAL HISTORY: 1. Inguinal hernia surgery as a child. 2. Trigger finger surgery. 3. Lithotripsy.  MEDICATIONS: 1. Celexa. 2. Lozol. 3. Provigil as needed. 4. Flonase p.r.n. 5. Ibuprofen p.r.n.  ALLERGIES:  No known drug allergies.  SOCIAL HISTORY:  He denies any tobacco or drug use.  He drinks alcohol on a rare occasion.  He is married, with a child.  He works as a Teacher, early years/pre.  FAMILY HISTORY:  Noncontributory.  SOCIAL HISTORY:  He denies any chest pain, shortness of breath, dysuria, melena, or hematuria.  Otherwise, a 12 point review of systems is negative except as mentioned in the HPI.  PHYSICAL EXAMINATION:  GENERAL:  Well-developed, well-nourished white male in no apparent distress. VITAL SIGNS:  Temperature 98.6, heart rate 98, blood pressure 143/93, respirations 19, satting 100% on room air. HEENT:  Atraumatic, normocephalic.  Pupils are equal and round.  No scleral icterus.  No external ear lesions.  Hearing  grossly normal. NECK:  Supple.  No lymphadenopathy.  Trachea is midline. PULMONARY:  Lungs are clear to auscultation bilaterally.  Symmetric chest rise.  No accessory use of muscles. CARDIOVASCULAR:  Regular rate and rhythm.  2+ radial pulse. ABDOMEN:  Soft, nondistended.  Hypoactive bowel sounds.  He is tender to palpation in his right upper quadrant and epigastrium.  There is no rebound.  There is no peritonitis. MUSCULOSKELETAL:  Free range of motion.  Moves all extremities.  No obvious joint deformity. SKIN:  No jaundice, no rash, no edema. NEURO:  Nonfocal.  Sensation grossly intact. PSYCHIATRIC:  Alert and oriented x3.  Judgment and insight appear appropriate.  LABORATORY DATA:  Sodium 138, potassium 3.7, chloride 99, bicarb 30, BUN 7, creatinine 1, blood sugar  101, calcium 9.6, total bilirubin 0.7, AST 21, ALT 25, alk phos 72, and lipase 27.  CRP is negative.  White count 13.2, hemoglobin 13.7, hematocrit 40, and platelet count 389,000.  DIAGNOSTICS:  Ultrasound of his abdomen done October 21, 2010, showed gallstones as well as wall thickness around 3 mm.  No pericholecystic fluid, bile duct was normal.  He had nonobstructing right renal calculi. He also had a non contrasted CT scan last Sunday which showed 4 tiny stones in the right kidney without evidence of urinary obstruction as well as 2 right renal cysts.  IMPRESSION:  A 35 year old white male with: 1. Symptomatic cholelithiasis. 2. Probable acute cholecystitis. 3. Depression. 4. History of nephrolithiasis.  PLAN:  I recommended admission to the patient.  It is probable that he has early acute cholecystitis.  This is his second presentation to the emergency room within the past week.  Therefore, I think he has failed outpatient management.  I recommend admission with IV fluids and antiemetics.  We are also going to start on antibiotics because of the probability of acute cholecystitis given his white count of 13,000.  We will plan to do a laparoscopic cholecystectomy tomorrow by Dr. Luisa Hart.  I discussed the risks and benefits of surgery including bleeding, infection, injury to surrounding structures, need to convert to an open procedure, bile leak, prolonged diarrhea, DVT, and pulmonary embolism recurrence.     Mary Sella. Andrey Campanile, MD     EMW/MEDQ  D:  10/26/2010  T:  10/27/2010  Job:  [id number]  cc:   Kerby Nora, MD  Electronically Signed by Gaynelle Adu M.D. on 11/20/2010 07:39:17 AM

## 2010-11-25 ENCOUNTER — Other Ambulatory Visit: Payer: Self-pay | Admitting: Family Medicine

## 2010-11-25 NOTE — Telephone Encounter (Signed)
Dr Bedsole patient.  

## 2010-11-26 ENCOUNTER — Other Ambulatory Visit: Payer: Self-pay | Admitting: *Deleted

## 2010-11-26 MED ORDER — FLUTICASONE PROPIONATE 50 MCG/ACT NA SUSP: 2.0000 | Freq: Every day | NASAL | Status: DC

## 2010-11-28 ENCOUNTER — Other Ambulatory Visit: Payer: Self-pay | Admitting: *Deleted

## 2010-11-28 MED ORDER — FLUTICASONE PROPIONATE 50 MCG/ACT NA SUSP: 2.0000 | Freq: Every day | NASAL | Status: DC

## 2010-12-02 ENCOUNTER — Other Ambulatory Visit: Payer: Self-pay | Admitting: *Deleted

## 2010-12-02 NOTE — Telephone Encounter (Signed)
Rx already done

## 2010-12-05 ENCOUNTER — Other Ambulatory Visit: Payer: Self-pay | Admitting: *Deleted

## 2010-12-05 MED ORDER — CITALOPRAM HYDROBROMIDE 20 MG PO TABS: 20.0000 mg | ORAL_TABLET | Freq: Every day | ORAL | Status: DC

## 2010-12-09 ENCOUNTER — Other Ambulatory Visit: Payer: Self-pay | Admitting: *Deleted

## 2010-12-09 MED ORDER — FLUTICASONE PROPIONATE 50 MCG/ACT NA SUSP: 2.0000 | Freq: Every day | NASAL | Status: DC

## 2010-12-09 NOTE — Telephone Encounter (Signed)
Refilled Rx. 

## 2011-03-06 ENCOUNTER — Other Ambulatory Visit: Payer: Self-pay | Admitting: *Deleted

## 2011-03-06 NOTE — Telephone Encounter (Signed)
We have not prescribed this for the pt in the past. Request should go to previous specialist who prescribed in past, or find out what this pt is taking it for and for how long etc.

## 2011-06-10 ENCOUNTER — Telehealth: Payer: Self-pay | Admitting: Family Medicine

## 2011-06-10 NOTE — Telephone Encounter (Signed)
Pt states he has been taking citalopram and is having a problem with weight gain, he has gained about 15 lbs in the last year.  He would like to try something like wellbutrin, or something else that wont cause the weight gain.  Uses [facility].

## 2011-06-10 NOTE — Telephone Encounter (Signed)
ERROR

## 2011-06-11 MED ORDER — BUPROPION HCL ER (XL) 150 MG PO TB24: 150.0000 mg | ORAL_TABLET | Freq: Every day | ORAL | Status: DC

## 2011-06-11 NOTE — Telephone Encounter (Signed)
Patient notified as instructed by telephone. Pt said he has never taken Wellbutrin or Effexor. Please advise.

## 2011-06-11 NOTE — Telephone Encounter (Signed)
Can change to wellbutrin which is weight neutral.. Or other option would be effexor which can cause mild weight loss as SE... Has he tried effexor in past?

## 2011-06-11 NOTE — Telephone Encounter (Signed)
Patient notified as instructed by telephone. Pt said he would prefer trying Wellbutrin and would like rx sent to [facility] instead of [facility] because pt is not working today.  Pt will call if any concerns before his appt with Dr Ermalene Searing on 06/25/11 .

## 2011-06-11 NOTE — Telephone Encounter (Signed)
I would suggest effexor unless he is worried about sexual SE.. wellbutrin has less sexual SE than other antidepressants. He is a Teacher, early years/pre.. He can chose and let me know.

## 2011-06-25 ENCOUNTER — Ambulatory Visit: Payer: BC Managed Care – PPO | Admitting: Family Medicine

## 2011-06-29 ENCOUNTER — Encounter: Payer: Self-pay | Admitting: Family Medicine

## 2011-06-29 ENCOUNTER — Ambulatory Visit (INDEPENDENT_AMBULATORY_CARE_PROVIDER_SITE_OTHER): Payer: 59 | Admitting: Family Medicine

## 2011-06-29 VITALS — BP 120/80 | HR 80 | Temp 98.6°F | Ht 67.0 in | Wt 204.4 lb

## 2011-06-29 DIAGNOSIS — E7800 Pure hypercholesterolemia, unspecified: Secondary | ICD-10-CM

## 2011-06-29 DIAGNOSIS — F321 Major depressive disorder, single episode, moderate: Secondary | ICD-10-CM

## 2011-06-29 DIAGNOSIS — J8410 Pulmonary fibrosis, unspecified: Secondary | ICD-10-CM

## 2011-06-29 DIAGNOSIS — G5600 Carpal tunnel syndrome, unspecified upper limb: Secondary | ICD-10-CM

## 2011-06-29 DIAGNOSIS — G4726 Circadian rhythm sleep disorder, shift work type: Secondary | ICD-10-CM

## 2011-06-29 DIAGNOSIS — G5601 Carpal tunnel syndrome, right upper limb: Secondary | ICD-10-CM | POA: Insufficient documentation

## 2011-06-29 MED ORDER — BUPROPION HCL ER (XL) 150 MG PO TB24: 150.0000 mg | ORAL_TABLET | Freq: Every day | ORAL | Status: DC

## 2011-06-29 NOTE — Assessment & Plan Note (Signed)
Working on lifestyle changes.. Will check at work in 07/2011.  INfo given on diet changes.

## 2011-06-29 NOTE — Assessment & Plan Note (Signed)
Stable control on current dose wellbutrin. No need for current increase, but he will call if not as effective as goal. Less SE on this med.

## 2011-06-29 NOTE — Assessment & Plan Note (Signed)
Eval neg.Marland Kitchen Possible past exposure as child or other.

## 2011-06-29 NOTE — Patient Instructions (Addendum)
Work on healthy eating habits, exercise and weight loss. Focus on low chol diet. Fax [place] lab results when done. Continue wellbutrin on current dose, but call if not as effective as past medication.  Follow up in 1 year for CPX.

## 2011-06-29 NOTE — Assessment & Plan Note (Signed)
Resolved

## 2011-06-29 NOTE — Progress Notes (Signed)
  Subjective:    Patient ID: Aaron Malone, male    DOB: [birth year], 35 y.o.   MRN: [id number]   HPI  35 year old male with history of  Depression. Was previously on celexa but was having SE of weight gain, increased appetitie.  No longer using  provigil for circadian sleep disorder given not working night shifts. ON 10/24 he was started on wellbutrin Xl.. 3 weeks.  So far this med is working well, less dramatic appeite issues. Has lost 3 lbs  Since changing med. Will follow weight. Minimal exercise, moderate diet.  Works for [employer].. Will have chol testing at work 07/2011.    Review of Systems  Constitutional: Negative for fever, fatigue and unexpected weight change.  HENT: Negative for ear pain, congestion, sore throat, rhinorrhea, trouble swallowing and postnasal drip.   Eyes: Negative for pain.  Respiratory: Negative for cough, shortness of breath and wheezing.   Cardiovascular: Negative for chest pain, palpitations and leg swelling.  Gastrointestinal: Negative for nausea, abdominal pain, diarrhea, constipation and blood in stool.  Genitourinary: Negative for dysuria, urgency, hematuria, discharge, penile swelling, scrotal swelling, difficulty urinating, penile pain and testicular pain.  Skin: Negative for rash.  Neurological: Negative for syncope, weakness, light-headedness, numbness and headaches.  Psychiatric/Behavioral: Negative for behavioral problems and dysphoric mood. The patient is not nervous/anxious.        Objective:   Physical Exam  Constitutional: He is oriented to person, place, and time. Vital signs are normal. He appears well-developed and well-nourished.  HENT:  Head: Normocephalic.  Right Ear: Hearing normal.  Left Ear: Hearing normal.  Nose: Nose normal.  Mouth/Throat: Oropharynx is clear and moist and mucous membranes are normal.  Neck: Trachea normal. Carotid bruit is not present. No mass and no thyromegaly present.  Cardiovascular: Normal rate,  regular rhythm and normal pulses.  Exam reveals no gallop, no distant heart sounds and no friction rub.   No murmur heard.      No peripheral edema  Pulmonary/Chest: Effort normal and breath sounds normal. No respiratory distress.  Neurological: He is alert and oriented to person, place, and time.  Skin: Skin is warm, dry and intact. No rash noted.  Psychiatric: He has a normal mood and affect. His speech is normal and behavior is normal. Judgment and thought content normal.          Assessment & Plan:

## 2011-07-11 ENCOUNTER — Emergency Department (HOSPITAL_COMMUNITY)
Admission: EM | Admit: 2011-07-11 | Discharge: 2011-07-11 | Disposition: A | Discharge status: Home / Self Care | Payer: 59 | Source: Home / Self Care | Attending: Family Medicine | Admitting: Family Medicine

## 2011-07-11 ENCOUNTER — Encounter (HOSPITAL_COMMUNITY): Payer: Self-pay | Admitting: Emergency Medicine

## 2011-07-11 DIAGNOSIS — R6889 Other general symptoms and signs: Secondary | ICD-10-CM

## 2011-07-11 LAB — POCT URINALYSIS DIP (DEVICE)
Ketones, ur: NEGATIVE mg/dL
Protein, ur: 30 mg/dL — AB
Specific Gravity, Urine: >=1.03 (ref 1.005–1.030)
pH: 5.5 (ref 5.0–8.0)

## 2011-07-11 NOTE — ED Provider Notes (Signed)
History     CSN: [id number] Arrival date & time: 07/11/2011 10:24 AM   First MD Initiated Contact with Patient 07/11/11 [phone]      Chief Complaint  Patient presents with  . Fever    (Consider location/radiation/quality/duration/timing/severity/associated sxs/prior treatment) HPI Comments: Neftaly presents for evaluation of 24 hrs of fever and malaise with no other symptoms; wife advised to come in; works as Teacher, early years/pre at [employer]; hx of prostatitis and kidney stones; no sick contacts. Reports fever to 101.5 around 1 am; took ibuprofen. Also decreased appetite.   Patient is a 35 y.o. male presenting with fever. The history is provided by the patient.  Fever Primary symptoms of the febrile illness include fever, fatigue and myalgias. Primary symptoms do not include cough, wheezing, shortness of breath, nausea, vomiting or dysuria. The current episode started yesterday.    Past Medical History  Diagnosis Date  . Allergic rhinitis, cause unspecified   . Circadian rhythm sleep disorder, shift work type   . Major depressive disorder, single episode, moderate   . Routine general medical examination at a health care facility   . Personal history of urinary calculi   . Pure hypercholesterolemia   . Abdominal pain, right upper quadrant     Past Surgical History  Procedure Date  . Lithotripsy 12/2003 and 09/2006  . Inguinal hernia repair 1983    Bilateral  . Trigger thumb 1981  . Cholecystectomy 2012    acute on chronic cholecystitis  . Lithotripsy     Family History  Problem Relation Age of Onset  . Hyperlipidemia Mother   . Hypertension Mother   . Polycystic ovary syndrome Sister   . Cancer Maternal Grandmother     Colon and Lung  . Cancer Maternal Grandfather     Prostate  . Heart failure Maternal Grandfather   . Multiple myeloma Paternal Grandfather   . Diabetes Paternal Grandmother     History  Substance Use Topics  . Smoking status: Never Smoker   . Smokeless  tobacco: Not on file  . Alcohol Use: Yes     Rarely      Review of Systems  Constitutional: Positive for fever, appetite change and fatigue.  HENT: Negative.   Eyes: Negative.   Respiratory: Negative.  Negative for cough, shortness of breath and wheezing.   Cardiovascular: Negative.   Gastrointestinal: Negative.  Negative for nausea and vomiting.  Genitourinary: Negative.  Negative for dysuria.  Musculoskeletal: Positive for myalgias.  Neurological: Negative.     Allergies  Review of patient's allergies indicates no known allergies.  Home Medications   Current Outpatient Rx  Name Route Sig Dispense Refill  . BUPROPION HCL ER (XL) 150 MG PO TB24 Oral Take 1 tablet (150 mg total) by mouth daily. 90 tablet 3  . FLUTICASONE PROPIONATE 50 MCG/ACT NA SUSP Nasal 2 sprays by Nasal route daily. 3 Act 3  . IBUPROFEN 600 MG PO TABS Oral Take 600 mg by mouth every 6 (six) hours as needed.      . INDAPAMIDE 1.25 MG PO TABS Oral Take 1.25 mg by mouth every morning.        BP 113/83  Pulse 88  Temp(Src) 98 F (36.7 C) (Oral)  Resp 17  SpO2 99%  Physical Exam  Nursing note and vitals reviewed. Constitutional: He is oriented to person, place, and time. He appears well-developed and well-nourished.  HENT:  Head: Normocephalic and atraumatic.  Right Ear: Tympanic membrane and external ear normal.  Left Ear:  Tympanic membrane and external ear normal.  Mouth/Throat: Uvula is midline, oropharynx is clear and moist and mucous membranes are normal.  Eyes: EOM are normal.  Neck: Normal range of motion.  Cardiovascular: Normal rate and regular rhythm.   Pulmonary/Chest: Effort normal and breath sounds normal.  Neurological: He is alert and oriented to person, place, and time.  Skin: Skin is warm and dry.    ED Course  Procedures (including critical care time)  Labs Reviewed  POCT URINALYSIS DIP (DEVICE) - Abnormal; Notable for the following:    Bilirubin Urine SMALL (*)    Hgb  urine dipstick LARGE (*)    Protein, ur 30 (*)    All other components within normal limits  POCT URINALYSIS DIPSTICK   No results found.   No diagnosis found.    MDM          Richardo Priest, MD 07/11/11 1124

## 2011-07-11 NOTE — ED Notes (Signed)
C/o onset of symptoms yesterday am: fever 101.5, fever, chills.  No nausea, no vomiting, minimal loose stools.  Generalized aches and pain, nonspecific.  Denies urinary symptoms.  Reports feeling like this in the past and being treated for prostatitis

## 2011-07-13 ENCOUNTER — Emergency Department (INDEPENDENT_AMBULATORY_CARE_PROVIDER_SITE_OTHER)
Admission: EM | Admit: 2011-07-13 | Discharge: 2011-07-13 | Disposition: A | Discharge status: Home / Self Care | Payer: 59 | Source: Home / Self Care | Attending: Emergency Medicine | Admitting: Emergency Medicine

## 2011-07-13 ENCOUNTER — Encounter (HOSPITAL_COMMUNITY): Payer: Self-pay

## 2011-07-13 ENCOUNTER — Emergency Department (INDEPENDENT_AMBULATORY_CARE_PROVIDER_SITE_OTHER): Payer: 59

## 2011-07-13 DIAGNOSIS — J40 Bronchitis, not specified as acute or chronic: Secondary | ICD-10-CM

## 2011-07-13 DIAGNOSIS — E876 Hypokalemia: Secondary | ICD-10-CM

## 2011-07-13 DIAGNOSIS — J111 Influenza due to unidentified influenza virus with other respiratory manifestations: Secondary | ICD-10-CM

## 2011-07-13 LAB — DIFFERENTIAL
Basophils Absolute: 0 10*3/uL (ref 0.0–0.1)
Eosinophils Relative: 1 % (ref 0–5)
Lymphocytes Relative: 28 % (ref 12–46)
Monocytes Absolute: 0.6 10*3/uL (ref 0.1–1.0)
Monocytes Relative: 13 % — ABNORMAL HIGH (ref 3–12)

## 2011-07-13 LAB — COMPREHENSIVE METABOLIC PANEL
BUN: 9 mg/dL (ref 6–23)
CO2: 28 mEq/L (ref 19–32)
Calcium: 9.7 mg/dL (ref 8.4–10.5)
Chloride: 100 mEq/L (ref 96–112)
Creatinine, Ser: 0.92 mg/dL (ref 0.50–1.35)
GFR calc Af Amer: >90 mL/min (ref >90)
GFR calc non Af Amer: >90 mL/min (ref >90)
Glucose, Bld: 82 mg/dL (ref 70–99)
Total Bilirubin: 0.5 mg/dL (ref 0.3–1.2)

## 2011-07-13 LAB — CBC
HCT: 41.8 % (ref 39.0–52.0)
Hemoglobin: 14.6 g/dL (ref 13.0–17.0)
MCV: 91.5 fL (ref 78.0–100.0)
RDW: 12.5 % (ref 11.5–15.5)
WBC: 4.4 10*3/uL (ref 4.0–10.5)

## 2011-07-13 LAB — POCT URINALYSIS DIP (DEVICE)
Ketones, ur: NEGATIVE mg/dL
Protein, ur: NEGATIVE mg/dL
Specific Gravity, Urine: 1.01 (ref 1.005–1.030)
Urobilinogen, UA: 0.2 mg/dL (ref 0.0–1.0)

## 2011-07-13 MED ORDER — POTASSIUM CHLORIDE CRYS ER 10 MEQ PO TBCR: 10.0000 meq | EXTENDED_RELEASE_TABLET | Freq: Every day | ORAL | Status: DC

## 2011-07-13 MED ORDER — OSELTAMIVIR PHOSPHATE 75 MG PO CAPS: 75.0000 mg | ORAL_CAPSULE | Freq: Two times a day (BID) | ORAL | Status: AC

## 2011-07-13 MED ORDER — AZITHROMYCIN 250 MG PO TABS: ORAL_TABLET | ORAL | Status: AC

## 2011-07-13 NOTE — ED Provider Notes (Signed)
History     CSN: [id number] Arrival date & time: 07/13/2011  4:07 PM   First MD Initiated Contact with Patient 07/13/11 1550      Chief Complaint  Patient presents with  . Generalized Body Aches  . 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 He has had a few loose stools. He has had no obvious exposures. No recent travel or animal exposure. He has had a flu vaccine this year. He's been taking ibuprofen and Sudafed for symptomatic relief. He rates his pain as a 3-4/10.  He was here past Saturday, 3 days ago and was evaluated with a normal examination. He also had a UA which showed large occult blood, but he states he's always has blood in the urine. He does have a history of kidney stones. He denies any dysuria, frequency, urgency, or gross blood in the urine and denies any flank pain or urinary symptoms. He has had no nausea or vomiting. He denies any history of a tick bite.  Patient is a 35 y.o. male presenting with fever.  Fever Primary symptoms of the febrile illness include fever, fatigue, headaches, cough, diarrhea and myalgias. Primary symptoms do not include wheezing, shortness of breath, abdominal pain, nausea, vomiting, dysuria or rash.  The headache is not associated with eye pain or neck stiffness.    Past Medical History  Diagnosis Date  . Allergic rhinitis, cause unspecified   . Circadian rhythm sleep disorder, shift work type   . Major depressive disorder, single episode, moderate   . Routine general medical examination at a health care facility   . Personal history of urinary calculi   . Pure  hypercholesterolemia   . Abdominal pain, right upper quadrant     Past Surgical History  Procedure Date  . Lithotripsy 12/2003 and 09/2006  . Inguinal hernia repair 1983    Bilateral  . Trigger thumb 1981  . Cholecystectomy 2012    acute on chronic cholecystitis  . Lithotripsy     Family History  Problem Relation Age of Onset  . Hyperlipidemia Mother   . Hypertension Mother   . Polycystic ovary syndrome Sister   . Cancer Maternal Grandmother     Colon and Lung  . Cancer Maternal Grandfather     Prostate  . Heart failure Maternal Grandfather   . Multiple myeloma Paternal Grandfather   . Diabetes Paternal Grandmother     History  Substance Use Topics  . Smoking status: Never Smoker   . Smokeless tobacco: Not on file  . Alcohol Use: Yes     Rarely      Review of Systems  Constitutional: Positive for fever, chills, appetite change and fatigue.  HENT: Positive for congestion, sore throat and rhinorrhea. Negative for ear pain, sneezing, trouble swallowing, neck pain, neck stiffness, voice change and postnasal drip.   Eyes: Negative for pain, discharge and redness.  Respiratory: Positive for cough and chest tightness. Negative for shortness of breath and wheezing.   Cardiovascular: Negative for chest pain.  Gastrointestinal: Positive for diarrhea. Negative for nausea, vomiting, abdominal pain and blood in stool.  Genitourinary: Positive for hematuria. Negative for dysuria, urgency, frequency and flank pain.  Musculoskeletal: Positive for myalgias. Negative  for back pain.  Skin: Negative for rash.  Neurological: Positive for headaches.    Allergies  Review of patient's allergies indicates no known allergies.  Home Medications   Current Outpatient Rx  Name Route Sig Dispense Refill  . BUPROPION HCL ER (XL) 150 MG PO TB24 Oral Take 1 tablet (150 mg total) by mouth daily. 90 tablet 3  . IBUPROFEN 600 MG PO TABS Oral Take 600 mg by mouth every 6 (six) hours as needed.       . INDAPAMIDE 1.25 MG PO TABS Oral Take 1.25 mg by mouth every morning.      Marland Kitchen PSEUDOEPHEDRINE HCL PO Oral Take 30 mg by mouth as needed.      . AZITHROMYCIN 250 MG PO TABS  Take 2 on day 1 then 1 daily for 4 more days. 6 each 0  . FLUTICASONE PROPIONATE 50 MCG/ACT NA SUSP Nasal 2 sprays by Nasal route daily. 3 Act 3  . OSELTAMIVIR PHOSPHATE 75 MG PO CAPS Oral Take 1 capsule (75 mg total) by mouth 2 (two) times daily. 10 capsule 0  . POTASSIUM CHLORIDE CRYS CR 10 MEQ PO TBCR Oral Take 1 tablet (10 mEq total) by mouth daily. 7 tablet 0    BP 139/90  Pulse 105  Temp(Src) 99.6 F (37.6 C) (Oral)  Resp 18  SpO2 100%  Physical Exam  Nursing note and vitals reviewed. Constitutional: He is oriented to person, place, and time. He appears well-developed and well-nourished. No distress.  HENT:  Head: Normocephalic and atraumatic.  Mouth/Throat: Oropharynx is clear and moist.  Eyes: Conjunctivae and EOM are normal. Pupils are equal, round, and reactive to light. No scleral icterus.  Neck: Normal range of motion. Neck supple.  Cardiovascular: Normal rate, regular rhythm and normal heart sounds.  Exam reveals no gallop and no friction rub.   No murmur heard. Pulmonary/Chest: Effort normal and breath sounds normal. No respiratory distress. He has no wheezes. He has no rales.  Abdominal: Soft. Bowel sounds are normal. He exhibits no distension and no mass. There is no tenderness. There is no rebound and no guarding.  Lymphadenopathy:    He has no cervical adenopathy.  Neurological: He is alert and oriented to person, place, and time.  Skin: Skin is warm and dry. No rash noted. He is not diaphoretic.    ED Course  Procedures (including critical care time)  Results for orders placed during the hospital encounter of 07/13/11  CBC      Component Value Range   WBC 4.4  4.0 - 10.5 (K/uL)   RBC 4.57  4.22 - 5.81 (MIL/uL)   Hemoglobin 14.6  13.0 - 17.0 (g/dL)   HCT 91.4  78.2 - 95.6 (%)   MCV  91.5  78.0 - 100.0 (fL)   MCH 31.9  26.0 - 34.0 (pg)   MCHC 34.9  30.0 - 36.0 (g/dL)   RDW 21.3  08.6 - 57.8 (%)   Platelets 240  150 - 400 (K/uL)  DIFFERENTIAL      Component Value Range   Neutrophils Relative 59  43 - 77 (%)   Neutro Abs 2.6  1.7 - 7.7 (K/uL)   Lymphocytes Relative 28  12 - 46 (%)   Lymphs Abs 1.2  0.7 - 4.0 (K/uL)   Monocytes Relative 13 (*) 3 - 12 (%)   Monocytes Absolute 0.6  0.1 - 1.0 (K/uL)   Eosinophils Relative 1  0 - 5 (%)   Eosinophils Absolute 0.0  0.0 - 0.7 (K/uL)   Basophils Relative 0  0 - 1 (%)   Basophils Absolute 0.0  0.0 - 0.1 (K/uL)  COMPREHENSIVE METABOLIC PANEL      Component Value Range   Sodium 139  135 - 145 (mEq/L)   Potassium 3.3 (*) 3.5 - 5.1 (mEq/L)   Chloride 100  96 - 112 (mEq/L)   CO2 28  19 - 32 (mEq/L)   Glucose, Bld 82  70 - 99 (mg/dL)   BUN 9  6 - 23 (mg/dL)   Creatinine, Ser 4.69  0.50 - 1.35 (mg/dL)   Calcium 9.7  8.4 - 62.9 (mg/dL)   Total Protein 8.0  6.0 - 8.3 (g/dL)   Albumin 4.2  3.5 - 5.2 (g/dL)   AST 31  0 - 37 (U/L)   ALT 37  0 - 53 (U/L)   Alkaline Phosphatase 60  39 - 117 (U/L)   Total Bilirubin 0.5  0.3 - 1.2 (mg/dL)   GFR calc non Af Amer >90  >90 (mL/min)   GFR calc Af Amer >90  >90 (mL/min)  POCT URINALYSIS DIP (DEVICE)      Component Value Range   Glucose, UA NEGATIVE  NEGATIVE (mg/dL)   Bilirubin Urine NEGATIVE  NEGATIVE    Ketones, ur NEGATIVE  NEGATIVE (mg/dL)   Specific Gravity, Urine 1.010  1.005 - 1.030    Hgb urine dipstick MODERATE (*) NEGATIVE    pH 6.5  5.0 - 8.0    Protein, ur NEGATIVE  NEGATIVE (mg/dL)   Urobilinogen, UA 0.2  0.0 - 1.0 (mg/dL)   Nitrite NEGATIVE  NEGATIVE    Leukocytes, UA NEGATIVE  NEGATIVE      Labs Reviewed  DIFFERENTIAL - Abnormal; Notable for the following:    Monocytes Relative 13 (*)    All other components within normal limits  COMPREHENSIVE METABOLIC PANEL - Abnormal; Notable for the following:    Potassium 3.3 (*)    All other components within normal  limits  POCT URINALYSIS DIP (DEVICE) - Abnormal; Notable for the following:    Hgb urine dipstick MODERATE (*)    All other components within normal limits  CBC  POCT URINALYSIS DIPSTICK  URINE CULTURE   Dg Chest 2 View  07/13/2011  *RADIOLOGY REPORT*  Clinical Data: Fever and cough.  CHEST - 2 VIEW  Comparison: 11/07/2010  Findings: There is suggestion of mild bronchial thickening, especially in the left perihilar lung.  No evidence of definite focal pneumonia.  No edema or pleural fluid.  Cardiac and mediastinal contours as well as visualized bony structures are within normal limits.  IMPRESSION: Suggestion of bronchitis, especially on the left.  No focal infiltrate identified.  Original Report Authenticated By: Reola Calkins, M.D.     1. Influenza   2. Bronchitis   3. Hypokalemia       MDM  He appears to have a flulike illness, so we'll go ahead and treat with Tamiflu. His chest x-ray showed bronchitis but no pneumonia. Also his potassium was a little bit low, probably due to to his indapamide he's taking.        Roque Lias, MD 07/13/11 [phone]

## 2011-07-13 NOTE — ED Notes (Signed)
Pt c/o generalized body aches, malaise and fever with some mild post nasal drainage and throat discomfort.  Sx started on Friday.  States was seen here Saturday for the same and no improvement noted.

## 2011-07-14 LAB — URINE CULTURE: Culture  Setup Time: 201211270112

## 2011-09-29 ENCOUNTER — Telehealth: Payer: Self-pay | Admitting: Family Medicine

## 2011-09-29 MED ORDER — CIPROFLOXACIN HCL 250 MG PO TABS: 250.0000 mg | ORAL_TABLET | Freq: Two times a day (BID) | ORAL | Status: AC

## 2011-09-29 MED ORDER — ONDANSETRON HCL 8 MG PO TABS: 8.0000 mg | ORAL_TABLET | Freq: Three times a day (TID) | ORAL | Status: AC | PRN

## 2011-09-29 NOTE — Telephone Encounter (Signed)
Triage Record Num: [id number] Operator: Arline Asp Loftin Patient Name: Aaron Malone Call Date & Time: 09/29/2011 12:29:03PM Patient Phone: [phone] PCP: Kerby Nora Patient Gender: Male PCP Fax : [phone] Patient DOB: February 15, 1976 Practice Name: [facility] Reason for Call: Caller: [name]/Patient. Will be travelling to [place] on 02/20, is requesting Rx for Cipro and Zofran to take with him in case they are needed. OFFICE PLEASE CALL [facility] AT [phone] AND ADVISE IF RX HAS BEEN CALLED IN. OUTPATIENT PHARM AT [facility], [phone]. Protocol(s) Used: Office Note Recommended Outcome per Protocol: Information Noted and Sent to Office Reason for Outcome: Caller information to office Care Advice: ~ 09/29/2011 12:36:59PM Page 1 of 1 [email or web address]

## 2011-09-29 NOTE — Telephone Encounter (Signed)
rx sent to pharmacy and patient number not in service to let him know

## 2011-09-29 NOTE — Telephone Encounter (Signed)
Ok  cipro 250 mg, 1 po bid x 3 days if diarrhea. #12, 0 refills zofran 8 mg, 1 po tid prn nausea, #30, 0 refills

## 2011-11-19 ENCOUNTER — Emergency Department (HOSPITAL_COMMUNITY)
Admission: EM | Admit: 2011-11-19 | Discharge: 2011-11-19 | Disposition: A | Discharge status: Home / Self Care | Payer: 59 | Source: Home / Self Care | Attending: Emergency Medicine | Admitting: Emergency Medicine

## 2011-11-19 ENCOUNTER — Encounter (HOSPITAL_COMMUNITY): Payer: Self-pay | Admitting: *Deleted

## 2011-11-19 DIAGNOSIS — R509 Fever, unspecified: Secondary | ICD-10-CM

## 2011-11-19 DIAGNOSIS — R319 Hematuria, unspecified: Secondary | ICD-10-CM

## 2011-11-19 LAB — POCT URINALYSIS DIP (DEVICE)
Glucose, UA: NEGATIVE mg/dL
Nitrite: NEGATIVE
Protein, ur: 30 mg/dL — AB
Specific Gravity, Urine: 1.02 (ref 1.005–1.030)
Urobilinogen, UA: 1 mg/dL (ref 0.0–1.0)

## 2011-11-19 MED ORDER — CIPROFLOXACIN HCL 500 MG PO TABS: 500.0000 mg | ORAL_TABLET | Freq: Two times a day (BID) | ORAL | Status: AC

## 2011-11-19 NOTE — ED Notes (Signed)
Reports "not feeling well" starting Sun night; has had general malaise over past few days.  Has had fever/chills over past couple days - temp up to 101.6.  Has been taking IBU 600mg  q 6-8 hrs - last dose approx 2 hrs ago.  C/O very mild sore throat, but denies any other sxs.  Inquired about obtaining urine sample - states he has had prostatitis without any sxs in past.

## 2011-11-19 NOTE — ED Provider Notes (Signed)
History     CSN: [id number]  Arrival date & time 11/19/11  4332   First MD Initiated Contact with Patient 11/19/11 1812      Chief Complaint  Patient presents with  . Fever    (Consider location/radiation/quality/duration/timing/severity/associated sxs/prior treatment) HPI Comments: Patient presents urgent care today complaining of nonspecific symptoms that he is uncertain about where this infection to be coming from. Has been experiencing generalized malaise feeling tired and fatigued but have been running fevers up to 101.6 have been using Motrin with partial improvement. He also describes it he has felt perhaps in in term attempt not constant sore throat, that is not present at this very moment even when he swallows.  Patient denies any abdominal pains, nausea vomiting or diarrhea as as well as any type of upper respiratory symptoms such as nasal congestion runny nose or cough Patient describes that he has been diagnosed in the past with prostatitis and kidney and ureteral stones in which he had not experienced any symptoms at all. He has seen 2 urologists in the past currently is active patient of [facility]. Patient denies any other symptomatology or close contacts  Patient is a 36 y.o. male presenting with fever. The history is provided by the patient.  Fever Primary symptoms of the febrile illness include fever, fatigue and dysuria. Primary symptoms do not include headaches, cough, shortness of breath, nausea, vomiting, diarrhea, myalgias, arthralgias or rash. The current episode started 3 to 5 days ago. This is a new problem. The problem has not changed since onset.   Past Medical History  Diagnosis Date  . Allergic rhinitis, cause unspecified   . Circadian rhythm sleep disorder, shift work type   . Major depressive disorder, single episode, moderate   . Routine general medical examination at a health care facility   . Personal history of urinary calculi   . Pure  hypercholesterolemia   . Abdominal pain, right upper quadrant   . Prostatitis     Past Surgical History  Procedure Date  . Lithotripsy 12/2003 and 09/2006  . Inguinal hernia repair 1983    Bilateral  . Trigger thumb 1981  . Cholecystectomy 2012    acute on chronic cholecystitis  . Lithotripsy     Family History  Problem Relation Age of Onset  . Hyperlipidemia Mother   . Hypertension Mother   . Polycystic ovary syndrome Sister   . Cancer Maternal Grandmother     Colon and Lung  . Cancer Maternal Grandfather     Prostate  . Heart failure Maternal Grandfather   . Multiple myeloma Paternal Grandfather   . Diabetes Paternal Grandmother     History  Substance Use Topics  . Smoking status: Never Smoker   . Smokeless tobacco: Not on file  . Alcohol Use: Yes     2-3 drinks per week      Review of Systems  Constitutional: Positive for fever, appetite change and fatigue.  HENT: Positive for sore throat. Negative for congestion, rhinorrhea, trouble swallowing and voice change.   Respiratory: Negative for cough and shortness of breath.   Gastrointestinal: Negative for nausea, vomiting and diarrhea.  Genitourinary: Positive for dysuria.  Musculoskeletal: Negative for myalgias and arthralgias.  Skin: Negative for rash.  Neurological: Negative for headaches.    Allergies  Review of patient's allergies indicates no known allergies.  Home Medications   Current Outpatient Rx  Name Route Sig Dispense Refill  . BUPROPION HCL ER (XL) 150 MG PO TB24  Oral Take 1 tablet (150 mg total) by mouth daily. 90 tablet 3  . FLUTICASONE PROPIONATE 50 MCG/ACT NA SUSP Nasal 2 sprays by Nasal route daily. 3 Act 3  . IBUPROFEN 600 MG PO TABS Oral Take 600 mg by mouth every 6 (six) hours as needed.      . INDAPAMIDE 1.25 MG PO TABS Oral Take 1.25 mg by mouth every morning.      Marland Kitchen PSEUDOEPHEDRINE HCL PO Oral Take 30 mg by mouth as needed.      Marland Kitchen CIPROFLOXACIN HCL 500 MG PO TABS Oral Take 1 tablet  (500 mg total) by mouth 2 (two) times daily. 20 tablet 0  . POTASSIUM CHLORIDE CRYS ER 10 MEQ PO TBCR Oral Take 1 tablet (10 mEq total) by mouth daily. 7 tablet 0    There were no vitals taken for this visit.  Physical Exam  Nursing note and vitals reviewed. Constitutional: He appears well-developed and well-nourished. No distress.  HENT:  Head: Normocephalic.  Mouth/Throat: Uvula is midline and oropharynx is clear and moist. No oropharyngeal exudate or posterior oropharyngeal erythema.  Abdominal: Soft. Normal appearance. There is no tenderness. There is no rigidity, no rebound, no guarding and negative Murphy's sign.  Skin: He is not diaphoretic.    ED Course  Procedures (including critical care time)  Labs Reviewed  POCT URINALYSIS DIP (DEVICE) - Abnormal; Notable for the following:    Hgb urine dipstick LARGE (*)    Protein, ur 30 (*)    Leukocytes, UA TRACE (*) Biochemical Testing Only. Please order routine urinalysis from main lab if confirmatory testing is needed.   All other components within normal limits  URINE CULTURE   No results found.   1. Fever   2. Hematuria       MDM  Patient presents with nonspecific symptoms and recurrent fevers for 5 days. Abnormal urine and a history of kidney and ureteral lithiasis asymptomatic. Patient decides to for rectal exam if he gets to expressing another episode have recommended him to see the urologist 4 glass test has to elucidate the he might be experiencing chronic prostatitis for an accurate urine sample that would develop more a prostatic origin and a his genitourinary tract patient agree with treatment plan and followup as necessary      Jimmie Molly, MD 11/19/11 1909

## 2011-11-19 NOTE — Discharge Instructions (Signed)
Given your current symptoms or abscess of in your current urine test results we discuss several things including empirical treatment for prostatitis versus a urinary tract infection we have sent your urine for cultures. I have encouraged you to followup with the urologist if you were to experience a similar episode within the next couple months as further evaluation will be necessary. We will contact you if abnormal culture results or need to change antibiotic class    Fever  Fever is a higher-than-normal body temperature. A normal temperature varies with:  Age.   How it is measured (mouth, underarm, rectal, or ear).   Time of day.  In an adult, an oral temperature around 98.6 Fahrenheit (F) or 37 Celsius (C) is considered normal. A rise in temperature of about 1.8 F or 1 C is generally considered a fever (100.4 F or 38 C). In an infant age 69 days or less, a rectal temperature of 100.4 F (38 C) generally is regarded as fever. Fever is not a disease but can be a symptom of illness. CAUSES   Fever is most commonly caused by infection.   Some non-infectious problems can cause fever. For example:   Some arthritis problems.   Problems with the thyroid or adrenal glands.   Immune system problems.   Some kinds of cancer.   A reaction to certain medicines.   Occasionally, the source of a fever cannot be determined. This is sometimes called a "Fever of Unknown Origin" (FUO).   Some situations may lead to a temporary rise in body temperature that may go away on its own. Examples are:   Childbirth.   Surgery.   Some situations may cause a rise in body temperature but these are not considered "true fever". Examples are:   Intense exercise.   Dehydration.   Exposure to high outside or room temperatures.  SYMPTOMS   Feeling warm or hot.   Fatigue or feeling exhausted.   Aching all over.   Chills.   Shivering.   Sweats.  DIAGNOSIS  A fever can be suspected by  your caregiver feeling that your skin is unusually warm. The fever is confirmed by taking a temperature with a thermometer. Temperatures can be taken different ways. Some methods are accurate and some are not: With adults, adolescents, and children:   An oral temperature is used most commonly.   An ear thermometer will only be accurate if it is positioned as recommended by the manufacturer.   Under the arm temperatures are not accurate and not recommended.   Most electronic thermometers are fast and accurate.  Infants and Toddlers:  Rectal temperatures are recommended and most accurate.   Ear temperatures are not accurate in this age group and are not recommended.   Skin thermometers are not accurate.  RISKS AND COMPLICATIONS   During a fever, the body uses more oxygen, so a person with a fever may develop rapid breathing or shortness of breath. This can be dangerous especially in people with heart or lung disease.   The sweats that occur following a fever can cause dehydration.   High fever can cause seizures in infants and children.   Older persons can develop confusion during a fever.  TREATMENT   Medications may be used to control temperature.   Do not give aspirin to children with fevers. There is an association with Reye's syndrome. Reye's syndrome is a rare but potentially deadly disease.   If an infection is present and medications have been  prescribed, take them as directed. Finish the full course of medications until they are gone.   Sponging or bathing with room-temperature water may help reduce body temperature. Do not use ice water or alcohol sponge baths.   Do not over-bundle children in blankets or heavy clothes.   Drinking adequate fluids during an illness with fever is important to prevent dehydration.  HOME CARE INSTRUCTIONS   For adults, rest and adequate fluid intake are important. Dress according to how you feel, but do not over-bundle.   Drink enough  water and/or fluids to keep your urine clear or pale yellow.   For infants over 3 months and children, giving medication as directed by your caregiver to control fever can help with comfort. The amount to be given is based on the child's weight. Do NOT give more than is recommended.  SEEK MEDICAL CARE IF:   You or your child are unable to keep fluids down.   Vomiting or diarrhea develops.   You develop a skin rash.   An oral temperature above 102 F (38.9 C) develops, or a fever which persists for over 3 days.   You develop excessive weakness, dizziness, fainting or extreme thirst.   Fevers keep coming back after 3 days.  SEEK IMMEDIATE MEDICAL CARE IF:   Shortness of breath or trouble breathing develops   You pass out.   You feel you are making little or no urine.   New pain develops that was not there before (such as in the head, neck, chest, back, or abdomen).   You cannot hold down fluids.   Vomiting and diarrhea persist for more than a day or two.   You develop a stiff neck and/or your eyes become sensitive to light.   An unexplained temperature above 102 F (38.9 C) develops.  Document Released: 08/03/2005 Document Revised: 07/23/2011 Document Reviewed: 07/19/2008 [facility] Patient Information 2012 [place], [place].

## 2011-11-20 LAB — URINE CULTURE: Culture: NO GROWTH

## 2011-12-14 ENCOUNTER — Other Ambulatory Visit: Payer: Self-pay | Admitting: Family Medicine

## 2012-01-19 ENCOUNTER — Encounter (HOSPITAL_COMMUNITY): Payer: Self-pay

## 2012-01-19 ENCOUNTER — Emergency Department (HOSPITAL_COMMUNITY)
Admission: EM | Admit: 2012-01-19 | Discharge: 2012-01-19 | Disposition: A | Discharge status: Home / Self Care | Payer: 59 | Source: Home / Self Care | Attending: Emergency Medicine | Admitting: Emergency Medicine

## 2012-01-19 DIAGNOSIS — J209 Acute bronchitis, unspecified: Secondary | ICD-10-CM

## 2012-01-19 MED ORDER — PREDNISONE 5 MG PO KIT: 1.0000 | PACK | Freq: Every day | ORAL | Status: DC

## 2012-01-19 MED ORDER — ALBUTEROL SULFATE HFA 108 (90 BASE) MCG/ACT IN AERS: 1.0000 | INHALATION_SPRAY | Freq: Four times a day (QID) | RESPIRATORY_TRACT | Status: DC | PRN

## 2012-01-19 MED ORDER — AZITHROMYCIN 250 MG PO TABS: ORAL_TABLET | ORAL | Status: AC

## 2012-01-19 MED ORDER — HYDROCOD POLST-CHLORPHEN POLST 10-8 MG/5ML PO LQCR: 5.0000 mL | Freq: Two times a day (BID) | ORAL | Status: DC | PRN

## 2012-01-19 NOTE — ED Provider Notes (Signed)
Chief Complaint  Patient presents with  . 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 He denies any wheezing, chest tightness, or chest pain. He's had some postnasal drip but no sore throat or hoarseness. No fevers, chills, or sweats. He denies any nasal congestion, rhinorrhea, sneezing, or itchy, watery eyes. He does have a history of allergies. This is usually controlled with Flonase and Zyrtec. He denies any history of asthma or reflux.  Review of Systems:  Other than noted above, the patient denies any of the following symptoms: Systemic:  No fevers, chills, sweats, weight loss or gain, fatigue, or tiredness. ENT:  No nasal congestion, sneezing, itching, postnasal drip, sinus pressure, headache, sore throat, or hoarseness. Lungs:  No wheezing, shortness of breath, chest tightness or congestion. Heart:  No chest pain, tightness, pressure, PND, orthopnea, or ankle edema. GI:  No indigestion, heartburn, waterbrash, burping, abdominal pain, nausea, or vomiting.  PMFSH:  Past medical history, family history, social history, meds, and allergies were reviewed.  Physical Exam:   Vital signs:  BP 118/82  Pulse 78  Temp(Src) 98.3 F (36.8 C) (Oral)  Resp 16  SpO2 99% General:  Alert and oriented.  In no distress.  Skin warm and dry. ENT: TMs and ear canals normal.  Nasal mucosa normal, without drainage.  Pharynx clear without exudate or drainage.  No intraoral lesions. Neck:  No adenopathy, tenderness or mass.  No JVD. Lungs:  No respiratory distress.  Breath sounds clear and equal bilaterally.  No wheezes, rales or rhonchi. Heart:  Regular rhythm, no gallops or murmers.  No pedal edema. Abdomon:  Soft and nontender.  No organomegaly or mass.  Medical Decision Making:  Most likely this is reactive airways disease secondary to respiratory infection. Other possibilities include postnasal  drip, asthma, or reflux.  Assessment:  The encounter diagnosis was Acute bronchitis.  Plan:   1.  The following meds were prescribed:   New Prescriptions   ALBUTEROL (PROVENTIL HFA;VENTOLIN HFA) 108 (90 BASE) MCG/ACT INHALER    Inhale 1-2 puffs into the lungs every 6 (six) hours as needed for wheezing.   AZITHROMYCIN (ZITHROMAX Z-PAK) 250 MG TABLET    Take as directed.   CHLORPHENIRAMINE-HYDROCODONE (TUSSIONEX) 10-8 MG/5ML LQCR    Take 5 mLs by mouth every 12 (twelve) hours as needed.   PREDNISONE 5 MG KIT    Take 1 kit (5 mg total) by mouth daily after breakfast. Prednisone 5 mg 6 day dosepack.  Take as directed.   2.  The patient was instructed in symptomatic care and handouts were given. 3.  The patient was told to return if becoming worse in any way, if no better in 3 or 4 days, and given some red flag symptoms that would indicate earlier return.     Reuben Likes, MD 01/19/12 [phone]

## 2012-01-19 NOTE — ED Notes (Signed)
C/o past few weeks, has been having more problems w allergy type syx than usual, gets worse at night. Minor relief w OTC medications, no color changes in secretions, no dental pain

## 2012-01-19 NOTE — Discharge Instructions (Signed)

## 2012-03-08 ENCOUNTER — Ambulatory Visit (INDEPENDENT_AMBULATORY_CARE_PROVIDER_SITE_OTHER): Payer: 59 | Admitting: Family Medicine

## 2012-03-08 ENCOUNTER — Telehealth: Payer: Self-pay | Admitting: Family Medicine

## 2012-03-08 ENCOUNTER — Encounter: Payer: Self-pay | Admitting: Family Medicine

## 2012-03-08 VITALS — BP 118/80 | HR 80 | Temp 98.6°F | Wt 202.0 lb

## 2012-03-08 DIAGNOSIS — S30860A Insect bite (nonvenomous) of lower back and pelvis, initial encounter: Secondary | ICD-10-CM | POA: Insufficient documentation

## 2012-03-08 DIAGNOSIS — W57XXXA Bitten or stung by nonvenomous insect and other nonvenomous arthropods, initial encounter: Secondary | ICD-10-CM | POA: Insufficient documentation

## 2012-03-08 DIAGNOSIS — T148XXA Other injury of unspecified body region, initial encounter: Secondary | ICD-10-CM

## 2012-03-08 NOTE — Progress Notes (Signed)
  Subjective:    Patient ID: Aaron Malone, male    DOB: 1976/03/21, 36 y.o.   MRN: [id number]  HPI  36 year old male present following tick bite to right side last night. Was not on there very long. Was not engorged.  Male lone star tick. Abe to remove mouthparts intact. Redness around bite.  No fever, no other symptoms.     Review of Systems  Constitutional: Negative for fever, fatigue and unexpected weight change.  HENT: Negative for sore throat and trouble swallowing.   Eyes: Negative for pain.  Respiratory: Negative for cough, shortness of breath and wheezing.   Cardiovascular: Negative for chest pain, palpitations and leg swelling.  Gastrointestinal: Negative for nausea, abdominal pain, diarrhea, constipation and blood in stool.  Skin: Negative for rash.  Neurological: Negative for syncope, weakness, light-headedness, numbness and headaches.       Objective:   Physical Exam  Constitutional: He appears well-developed and well-nourished.  Cardiovascular: Normal rate, regular rhythm, normal heart sounds and intact distal pulses.   No murmur heard. Pulmonary/Chest: Effort normal and breath sounds normal. No respiratory distress. He has no wheezes. He has no rales. He exhibits no tenderness.  Skin:       Bite on right side, small amount of redness surrounding, firm induration at site          Assessment & Plan:

## 2012-03-08 NOTE — Telephone Encounter (Signed)
Caller: Greene/Patient; PCP: Kerby Nora E.; CB#: [phone]; Call regarding Tick Bite On R. Flank;  Onset 03/07/12.  Afebrile.   Imbedded lonestar tick removed 03/07/12. Noted quarter sized area of redness and discomfort at site. Advised to see MD within 4 hrs for signs of infection per Insect or Spider Bites and Stings.  No acute appts remain; transferred to [facility] for appt 03/08/12.

## 2012-03-08 NOTE — Telephone Encounter (Signed)
Jamie scheduled pt today 03/08/12 at 11:00 am  with Dr Ermalene Searing.

## 2012-03-08 NOTE — Assessment & Plan Note (Signed)
Local inflammatory reaction noted, no superficial infection. Discussed tick borne illness and time course. Pt will look out for symtpoms and rash in next 2 weeks to month.

## 2012-03-11 ENCOUNTER — Telehealth: Payer: Self-pay | Admitting: Family Medicine

## 2012-03-11 MED ORDER — DOXYCYCLINE HYCLATE 100 MG PO TABS: 100.0000 mg | ORAL_TABLET | Freq: Two times a day (BID) | ORAL | Status: AC

## 2012-03-11 NOTE — Telephone Encounter (Signed)
Caller: Elery/Patient; PCP: Kerby Nora E.; CB#: [phone]; ; ; Call regarding Tick Bite 03/07/12;  Onset 03/11/12 noting tick bite rash larger than when in office 03/08/12 for tick bite of 03/07/12, now size of half dollar, looks like bullseye, painful to touch area. Afebrile. Emergent sxs of "History of tick bite and now has rash, fever, headache, joint or muscle pain or swollen lymph glands" per Bites and Stings protocol. Caller unable to schedule an appt per guidelines due to being at work until 1800, RN advised needs evaluation within 4 hours, caller will consider going to [facility] after work.

## 2012-03-11 NOTE — Telephone Encounter (Signed)
Let pt know that since we just saw him here.. Given bullseye appearance.. Will send in rx for doxycyline to cover Lyme disease.

## 2012-03-11 NOTE — Telephone Encounter (Signed)
Advised patient

## 2012-04-25 ENCOUNTER — Telehealth: Payer: Self-pay

## 2012-04-25 DIAGNOSIS — Z113 Encounter for screening for infections with a predominantly sexual mode of transmission: Secondary | ICD-10-CM

## 2012-04-25 NOTE — Telephone Encounter (Signed)
Pt already scheduled for CPX labs on 04/29/12 at 7:30 am; pt would like to add all STD screening labs to be done at that time also. Please advise.

## 2012-04-26 NOTE — Telephone Encounter (Signed)
noted 

## 2012-04-26 NOTE — Telephone Encounter (Signed)
LAb can add GC/chlamydia if p interested.. Would need to be first urine of day sample.

## 2012-04-29 ENCOUNTER — Telehealth: Payer: Self-pay | Admitting: Family Medicine

## 2012-04-29 ENCOUNTER — Other Ambulatory Visit (INDEPENDENT_AMBULATORY_CARE_PROVIDER_SITE_OTHER): Payer: 59

## 2012-04-29 DIAGNOSIS — E7800 Pure hypercholesterolemia, unspecified: Secondary | ICD-10-CM

## 2012-04-29 DIAGNOSIS — Z113 Encounter for screening for infections with a predominantly sexual mode of transmission: Secondary | ICD-10-CM

## 2012-04-29 DIAGNOSIS — Z202 Contact with and (suspected) exposure to infections with a predominantly sexual mode of transmission: Secondary | ICD-10-CM

## 2012-04-29 LAB — COMPREHENSIVE METABOLIC PANEL
Albumin: 4.4 g/dL (ref 3.5–5.2)
BUN: 14 mg/dL (ref 6–23)
CO2: 29 mEq/L (ref 19–32)
Calcium: 9.5 mg/dL (ref 8.4–10.5)
Chloride: 100 mEq/L (ref 96–112)
Creatinine, Ser: 0.9 mg/dL (ref 0.4–1.5)
GFR: 97.68 mL/min (ref >60.00)
Glucose, Bld: 90 mg/dL (ref 70–99)
Potassium: 3.6 mEq/L (ref 3.5–5.1)

## 2012-04-29 LAB — LIPID PANEL
Cholesterol: 208 mg/dL — ABNORMAL HIGH (ref 0–200)
HDL: 46.4 mg/dL (ref >39.00)
Triglycerides: 98 mg/dL (ref 0.0–149.0)

## 2012-04-29 NOTE — Telephone Encounter (Signed)
Message copied by Excell Seltzer on Fri Apr 29, 2012  8:40 AM ------      Message from: [place], [place] J      Created: Mon Apr 25, 2012  4:07 PM      Regarding: Lab orders for , Friday 04-29-12       Patient is scheduled for CPX labs, please order future labs, Thanks , Camelia Eng

## 2012-04-29 NOTE — Telephone Encounter (Signed)
GCchalm but also all other STD screening labs already entered. No other labs needed.

## 2012-04-29 NOTE — Telephone Encounter (Signed)
Pt came in this am for labs, he did want the urine gc/chlamydia done so he took a container home, I just wanted to double check and see if he needed any other labs for his cpx.

## 2012-04-30 LAB — SYPHILIS: RPR W/REFLEX TO RPR TITER AND TREPONEMAL ANTIBODIES, TRADITIONAL SCREENING AND DIAGNOSIS ALGORITHM

## 2012-04-30 LAB — HIV ANTIBODY (ROUTINE TESTING W REFLEX): HIV: NONREACTIVE

## 2012-05-02 LAB — HEPATITIS PANEL, ACUTE
HCV Ab: NEGATIVE
Hep A IgM: NEGATIVE
Hep B C IgM: NEGATIVE

## 2012-05-02 LAB — HSV(HERPES SMPLX)ABS-I+II(IGG+IGM)-BLD
HSV 2 Glycoprotein G Ab, IgG: 0.18 IV
Herpes Simplex Vrs I&II-IgM Ab (EIA): 0.25 INDEX

## 2012-05-03 ENCOUNTER — Ambulatory Visit (INDEPENDENT_AMBULATORY_CARE_PROVIDER_SITE_OTHER): Payer: 59 | Admitting: Family Medicine

## 2012-05-03 ENCOUNTER — Encounter: Payer: Self-pay | Admitting: Family Medicine

## 2012-05-03 VITALS — BP 125/84 | HR 75 | Temp 98.2°F | Ht 67.0 in | Wt 187.5 lb

## 2012-05-03 DIAGNOSIS — Z202 Contact with and (suspected) exposure to infections with a predominantly sexual mode of transmission: Secondary | ICD-10-CM

## 2012-05-03 DIAGNOSIS — F321 Major depressive disorder, single episode, moderate: Secondary | ICD-10-CM

## 2012-05-03 DIAGNOSIS — F524 Premature ejaculation: Secondary | ICD-10-CM

## 2012-05-03 DIAGNOSIS — E7800 Pure hypercholesterolemia, unspecified: Secondary | ICD-10-CM

## 2012-05-03 DIAGNOSIS — Z0000 Encounter for general adult medical examination without abnormal findings: Secondary | ICD-10-CM

## 2012-05-03 DIAGNOSIS — Z2089 Contact with and (suspected) exposure to other communicable diseases: Secondary | ICD-10-CM

## 2012-05-03 MED ORDER — PAROXETINE HCL 20 MG PO TABS: 20.0000 mg | ORAL_TABLET | Freq: Every day | ORAL | Status: DC | PRN

## 2012-05-03 NOTE — Progress Notes (Signed)
Subjective:    Patient ID: Aaron Malone, male    DOB: 21-Oct-1975, 36 y.o.   MRN: [id number]  HPI The patient is here for annual wellness exam and preventative care.    Elevated Cholesterol: LDL at goal <160. Lab Results  Component Value Date   CHOL 208* 04/29/2012   HDL 46.40 04/29/2012   LDLCALC 135* 06/29/2008   LDLDIRECT 149.0 04/29/2012   TRIG 98.0 04/29/2012   CHOLHDL 4 04/29/2012  Using medications without problems: None Diet compliance: Good Exercise: None. Other complaints:  Depression: moderate control.. Stable on wellbutrin Xl despite wife recently cheated on him. STD screen neg. They are staying together.  Getting marriage counseling and individual counseling. He was initially not eating and sleeping...lost 17 lbs in 3 weeks. He was having some premature ejaculation prior and now... He is interested in tring paxil  X 3 weeks then as needed 2-3 times a week. He is a Teacher, early years/pre. (This area was well controlled on citalopram)         Review of Systems  Constitutional: Negative for fever, fatigue and unexpected weight change.  HENT: Negative for ear pain, congestion, sore throat, rhinorrhea, trouble swallowing and postnasal drip.   Eyes: Negative for pain.  Respiratory: Negative for cough, shortness of breath and wheezing.   Cardiovascular: Negative for chest pain, palpitations and leg swelling.  Gastrointestinal: Negative for nausea, abdominal pain, diarrhea, constipation and blood in stool.  Genitourinary: Negative for dysuria, urgency, hematuria, discharge, penile swelling, scrotal swelling, difficulty urinating, penile pain and testicular pain.  Skin: Negative for rash.  Neurological: Negative for syncope, weakness, light-headedness, numbness and headaches.  Psychiatric/Behavioral: Negative for behavioral problems and dysphoric mood. The patient is not nervous/anxious.        Objective:   Physical Exam  Constitutional: He appears well-developed and  well-nourished.  Non-toxic appearance. He does not appear ill. No distress.  HENT:  Head: Normocephalic and atraumatic.  Right Ear: Hearing, tympanic membrane, external ear and ear canal normal.  Left Ear: Hearing, tympanic membrane, external ear and ear canal normal.  Nose: Nose normal.  Mouth/Throat: Uvula is midline, oropharynx is clear and moist and mucous membranes are normal.  Eyes: Conjunctivae normal, EOM and lids are normal. Pupils are equal, round, and reactive to light. No foreign bodies found.  Neck: Trachea normal, normal range of motion and phonation normal. Neck supple. Carotid bruit is not present. No mass and no thyromegaly present.  Cardiovascular: Normal rate, regular rhythm, S1 normal, S2 normal, intact distal pulses and normal pulses.  Exam reveals no gallop.   No murmur heard. Pulmonary/Chest: Breath sounds normal. He has no wheezes. He has no rhonchi. He has no rales.  Abdominal: Soft. Normal appearance and bowel sounds are normal. There is no hepatosplenomegaly. There is no tenderness. There is no rebound, no guarding and no CVA tenderness. No hernia. Hernia confirmed negative in the right inguinal area and confirmed negative in the left inguinal area.  Genitourinary: Testes normal and penis normal. Right testis shows no mass and no tenderness. Left testis shows no mass and no tenderness. No paraphimosis or penile tenderness.  Lymphadenopathy:    He has no cervical adenopathy.       Right: No inguinal adenopathy present.       Left: No inguinal adenopathy present.  Neurological: He is alert. He has normal strength and normal reflexes. No cranial nerve deficit or sensory deficit. Gait normal.  Skin: Skin is warm, dry and intact. No rash noted.  Psychiatric:  He has a normal mood and affect. His speech is normal and behavior is normal. Judgment normal.          Assessment & Plan:  The patient's preventative maintenance and recommended screening tests for an annual  wellness exam were reviewed in full today. Brought up to date unless services declined.  Counselled on the importance of diet, exercise, and its role in overall health and mortality. The patient's FH and SH was reviewed, including their home life, tobacco status, and drug and alcohol status.   Vaccines: Uptodate, flu at work

## 2012-05-03 NOTE — Assessment & Plan Note (Signed)
Stable control on wellbutrin. No SI. Conitnue counsleing.

## 2012-05-03 NOTE — Patient Instructions (Addendum)
Work on regular exercise. We will call with results.

## 2012-05-03 NOTE — Assessment & Plan Note (Signed)
Reviewed uptodate article on this. Will try trial of paroxetine 20 mg daily x 3 weeks ten as needed prior to sex.

## 2012-05-03 NOTE — Addendum Note (Signed)
Addended by: Alvina Chou on: 05/03/2012 02:42 PM   Modules accepted: Orders

## 2012-05-03 NOTE — Assessment & Plan Note (Signed)
Moderate control with lifestyle. Encouraged exercise, weight loss, healthy eating habits.

## 2012-05-04 LAB — GC/CHLAMYDIA PROBE AMP, URINE: GC Probe Amp, Urine: NEGATIVE

## 2012-06-16 ENCOUNTER — Encounter: Payer: Self-pay | Admitting: Family Medicine

## 2012-06-16 ENCOUNTER — Telehealth: Payer: Self-pay | Admitting: Family Medicine

## 2012-06-16 NOTE — Telephone Encounter (Signed)
Caller: [name]/Patient; Patient Name: Aaron Malone; PCP: Kerby Nora [facility]); Best Callback Phone Number: [phone]. Reports that he had seen Dr. Ermalene Searing in September for an office visit. He has since been to a counselor and is having issues with depression. His counselor wants to increase the dose of Wellbutrin he is taking currently. PLEASE CALL PATIENT BACK AND LET HIM KNOW IF THE DOSAGE CAN BE INCREASED.

## 2012-06-17 ENCOUNTER — Other Ambulatory Visit: Payer: Self-pay | Admitting: Family Medicine

## 2012-06-17 MED ORDER — BUPROPION HCL ER (XL) 150 MG PO TB24: 300.0000 mg | ORAL_TABLET | Freq: Every day | ORAL | Status: DC

## 2012-06-17 NOTE — Telephone Encounter (Signed)
Se MyChart note

## 2012-07-07 ENCOUNTER — Ambulatory Visit: Payer: 59 | Admitting: Family Medicine

## 2012-07-08 ENCOUNTER — Ambulatory Visit: Payer: 59 | Admitting: Family Medicine

## 2012-07-19 ENCOUNTER — Ambulatory Visit (INDEPENDENT_AMBULATORY_CARE_PROVIDER_SITE_OTHER): Payer: 59 | Admitting: Family Medicine

## 2012-07-19 ENCOUNTER — Encounter: Payer: Self-pay | Admitting: Family Medicine

## 2012-07-19 ENCOUNTER — Other Ambulatory Visit: Payer: Self-pay

## 2012-07-19 VITALS — BP 120/78 | HR 100 | Temp 97.8°F | Ht 67.0 in | Wt 183.0 lb

## 2012-07-19 DIAGNOSIS — F524 Premature ejaculation: Secondary | ICD-10-CM

## 2012-07-19 DIAGNOSIS — F321 Major depressive disorder, single episode, moderate: Secondary | ICD-10-CM

## 2012-07-19 MED ORDER — BUPROPION HCL ER (XL) 300 MG PO TB24: 300.0000 mg | ORAL_TABLET | Freq: Every day | ORAL | Status: DC

## 2012-07-19 MED ORDER — PAROXETINE HCL 10 MG PO TABS: 10.0000 mg | ORAL_TABLET | ORAL | Status: DC

## 2012-07-19 NOTE — Assessment & Plan Note (Signed)
Well controlled on higher dose wellbutrin. Continue counseling.

## 2012-07-19 NOTE — Progress Notes (Signed)
  Subjective:    Patient ID: Aaron Malone, male    DOB: 06-24-1976, 36 y.o.   MRN: [id number]  HPI  Depression, moderate severe: Now on wellbutrin higher dose 300 mg daily XL.  He reports significant improvement. Overall less irritability. NO SI, no HI.  Sleeping okay. Seeing counselor 1-2 times a month.   Premature ejaculation:  Tried paxil 20 mg ...as needed.  This helps a lot but difficult to plan. He is interested in starting low dose daily. Saw Dr. Ronal Fear.    Review of Systems  Constitutional: Negative for fever and fatigue.  Respiratory: Negative for shortness of breath.   Cardiovascular: Negative for chest pain.  Gastrointestinal: Negative for abdominal pain.       Objective:   Physical Exam  Constitutional: Vital signs are normal. He appears well-developed and well-nourished.  HENT:  Head: Normocephalic.  Right Ear: Hearing normal.  Left Ear: Hearing normal.  Nose: Nose normal.  Mouth/Throat: Oropharynx is clear and moist and mucous membranes are normal.  Neck: Trachea normal. Carotid bruit is not present. No mass and no thyromegaly present.  Cardiovascular: Normal rate, regular rhythm and normal pulses.  Exam reveals no gallop, no distant heart sounds and no friction rub.   No murmur heard.      No peripheral edema  Pulmonary/Chest: Effort normal and breath sounds normal. No respiratory distress.  Skin: Skin is warm, dry and intact. No rash noted.  Psychiatric: He has a normal mood and affect. His speech is normal and behavior is normal. Thought content normal.          Assessment & Plan:

## 2012-07-19 NOTE — Patient Instructions (Addendum)
Continue higher dose of wellbutrin. Start paxil low dose daily  Call if mood and other issues not well controlled.

## 2012-07-19 NOTE — Telephone Encounter (Signed)
Aaron Malone pharmacy received 2 rx for paxil and wanted to confirm OK to give #90 with 3 refills. Advised OK.

## 2012-07-19 NOTE — Assessment & Plan Note (Signed)
Will change to paxil low dose daily for this issue.

## 2012-10-01 ENCOUNTER — Other Ambulatory Visit: Payer: Self-pay

## 2013-05-03 ENCOUNTER — Other Ambulatory Visit (INDEPENDENT_AMBULATORY_CARE_PROVIDER_SITE_OTHER): Payer: 59

## 2013-05-03 ENCOUNTER — Telehealth: Payer: Self-pay | Admitting: Family Medicine

## 2013-05-03 DIAGNOSIS — E7800 Pure hypercholesterolemia, unspecified: Secondary | ICD-10-CM

## 2013-05-03 LAB — LIPID PANEL: Cholesterol: 209 mg/dL — ABNORMAL HIGH (ref 0–200)

## 2013-05-03 LAB — COMPREHENSIVE METABOLIC PANEL
ALT: 24 U/L (ref 0–53)
CO2: 33 mEq/L — ABNORMAL HIGH (ref 19–32)
Calcium: 9.9 mg/dL (ref 8.4–10.5)
Chloride: 99 mEq/L (ref 96–112)
Creatinine, Ser: 0.8 mg/dL (ref 0.4–1.5)
GFR: 110.56 mL/min (ref >60.00)
Glucose, Bld: 92 mg/dL (ref 70–99)
Sodium: 137 mEq/L (ref 135–145)
Total Bilirubin: 0.7 mg/dL (ref 0.3–1.2)
Total Protein: 7.4 g/dL (ref 6.0–8.3)

## 2013-05-03 NOTE — Telephone Encounter (Signed)
Message copied by Excell Seltzer on Wed May 03, 2013  8:18 AM ------      Message from: Alvina Chou      Created: Thu Apr 27, 2013 11:18 AM      Regarding: Lab orders for Wednesday, 9.17.14       Patient is scheduled for CPX labs, please order future labs, Thanks , Terri       ------

## 2013-05-03 NOTE — Addendum Note (Signed)
Addended by: Baldomero Lamy on: 05/03/2013 10:11 AM   Modules accepted: Orders

## 2013-05-12 ENCOUNTER — Encounter: Payer: Self-pay | Admitting: Family Medicine

## 2013-05-12 ENCOUNTER — Ambulatory Visit (INDEPENDENT_AMBULATORY_CARE_PROVIDER_SITE_OTHER): Payer: 59 | Admitting: Family Medicine

## 2013-05-12 VITALS — BP 120/86 | HR 81 | Temp 98.1°F | Ht 66.0 in | Wt 204.5 lb

## 2013-05-12 DIAGNOSIS — Z0000 Encounter for general adult medical examination without abnormal findings: Secondary | ICD-10-CM

## 2013-05-12 DIAGNOSIS — F321 Major depressive disorder, single episode, moderate: Secondary | ICD-10-CM

## 2013-05-12 DIAGNOSIS — G5601 Carpal tunnel syndrome, right upper limb: Secondary | ICD-10-CM

## 2013-05-12 DIAGNOSIS — E7800 Pure hypercholesterolemia, unspecified: Secondary | ICD-10-CM

## 2013-05-12 DIAGNOSIS — G5600 Carpal tunnel syndrome, unspecified upper limb: Secondary | ICD-10-CM

## 2013-05-12 NOTE — Patient Instructions (Addendum)
Work on healthy eating and regular exercise. 

## 2013-05-12 NOTE — Progress Notes (Signed)
HPI  The patient is here for annual wellness exam and preventative care.   Elevated Cholesterol: LDL at goal <160.  Lab Results  Component Value Date   CHOL 209* 05/03/2013   HDL 52.60 05/03/2013   LDLCALC 135* 06/29/2008   LDLDIRECT 147.8 05/03/2013   TRIG 78.0 05/03/2013   CHOLHDL 4 05/03/2013   Using medications without problems: None  Diet compliance: moderate Exercise: None Other complaints:   Depression: followed by psychiatry now, Dr. Nolen Mu. Better control on pristiq. On paroxetine for  premature ejaculation.  Review of Systems  Constitutional: Negative for fever, fatigue and unexpected weight change.  HENT: Negative for ear pain, congestion, sore throat, rhinorrhea, trouble swallowing and postnasal drip.  Eyes: Negative for pain.  Respiratory: Negative for cough, shortness of breath and wheezing.  Cardiovascular: Negative for chest pain, palpitations and leg swelling.  Gastrointestinal: Negative for nausea, abdominal pain, diarrhea, constipation and blood in stool.  Genitourinary: Negative for dysuria, urgency, hematuria, discharge, penile swelling, scrotal swelling, difficulty urinating, penile pain and testicular pain.  Skin: Negative for rash.  Neurological: Negative for syncope, weakness, light-headedness, numbness and headaches.  Psychiatric/Behavioral: Negative for behavioral problems and dysphoric mood. The patient is not nervous/anxious.  Objective:   Physical Exam  Constitutional: He appears well-developed and well-nourished. Non-toxic appearance. He does not appear ill. No distress.  HENT:  Head: Normocephalic and atraumatic.  Right Ear: Hearing, tympanic membrane, external ear and ear canal normal.  Left Ear: Hearing, tympanic membrane, external ear and ear canal normal.  Nose: Nose normal.  Mouth/Throat: Uvula is midline, oropharynx is clear and moist and mucous membranes are normal.  Eyes: Conjunctivae normal, EOM and lids are normal. Pupils are equal,  round, and reactive to light. No foreign bodies found.  Neck: Trachea normal, normal range of motion and phonation normal. Neck supple. Carotid bruit is not present. No mass and no thyromegaly present.  Cardiovascular: Normal rate, regular rhythm, S1 normal, S2 normal, intact distal pulses and normal pulses. Exam reveals no gallop.  No murmur heard.  Pulmonary/Chest: Breath sounds normal. He has no wheezes. He has no rhonchi. He has no rales.  Abdominal: Soft. Normal appearance and bowel sounds are normal. There is no hepatosplenomegaly. There is no tenderness. There is no rebound, no guarding and no CVA tenderness. No hernia. Hernia confirmed negative in the right inguinal area and confirmed negative in the left inguinal area.  Genitourinary: Testes normal and penis normal. Right testis shows no mass and no tenderness. Left testis shows no mass and no tenderness. No paraphimosis or penile tenderness.  Lymphadenopathy:  He has no cervical adenopathy.  Right: No inguinal adenopathy present.  Left: No inguinal adenopathy present.  Neurological: He is alert. He has normal strength and normal reflexes. No cranial nerve deficit or sensory deficit. Gait normal.  Skin: Skin is warm, dry and intact. No rash noted.  Psychiatric: He has a normal mood and affect. His speech is normal and behavior is normal. Judgment normal.  Assessment & Plan:   The patient's preventative maintenance and recommended screening tests for an annual wellness exam were reviewed in full today.  Brought up to date unless services declined.  Counselled on the importance of diet, exercise, and its role in overall health and mortality.  The patient's FH and SH was reviewed, including their home life, tobacco status, and drug and alcohol status.   Vaccines: Uptodate, flu at work  No family history of early colon or prostate cancer. No desire for STD screen.

## 2013-05-12 NOTE — Assessment & Plan Note (Signed)
Followed by Dr. Nolen Mu, Pshychiatry

## 2013-05-12 NOTE — Assessment & Plan Note (Signed)
Encouraged exercise, weight loss, healthy eating habits. LDL at goal <160.

## 2013-06-05 ENCOUNTER — Encounter: Payer: Self-pay | Admitting: Family Medicine

## 2013-06-05 DIAGNOSIS — F329 Major depressive disorder, single episode, unspecified: Secondary | ICD-10-CM

## 2013-06-14 ENCOUNTER — Other Ambulatory Visit (INDEPENDENT_AMBULATORY_CARE_PROVIDER_SITE_OTHER): Payer: 59

## 2013-06-14 DIAGNOSIS — F329 Major depressive disorder, single episode, unspecified: Secondary | ICD-10-CM

## 2013-06-14 LAB — TSH: TSH: 1.46 u[IU]/mL (ref 0.35–5.50)

## 2013-06-15 ENCOUNTER — Encounter: Payer: Self-pay | Admitting: *Deleted

## 2013-06-22 ENCOUNTER — Other Ambulatory Visit: Payer: Self-pay

## 2013-07-10 ENCOUNTER — Other Ambulatory Visit: Payer: Self-pay | Admitting: Orthopedic Surgery

## 2013-07-20 ENCOUNTER — Encounter (HOSPITAL_BASED_OUTPATIENT_CLINIC_OR_DEPARTMENT_OTHER): Payer: Self-pay

## 2013-07-20 ENCOUNTER — Ambulatory Visit (HOSPITAL_BASED_OUTPATIENT_CLINIC_OR_DEPARTMENT_OTHER): Admit: 2013-07-20 | Payer: Self-pay | Admitting: Orthopedic Surgery

## 2013-07-20 SURGERY — CARPAL TUNNEL RELEASE
Anesthesia: Monitor Anesthesia Care | Site: Wrist | Laterality: Right

## 2013-08-07 ENCOUNTER — Encounter: Payer: Self-pay | Admitting: Family Medicine

## 2013-08-07 MED ORDER — ONDANSETRON HCL 8 MG PO TABS: 8.0000 mg | ORAL_TABLET | Freq: Three times a day (TID) | ORAL | Status: DC | PRN

## 2013-09-01 ENCOUNTER — Encounter (HOSPITAL_BASED_OUTPATIENT_CLINIC_OR_DEPARTMENT_OTHER): Payer: Self-pay

## 2013-09-01 ENCOUNTER — Ambulatory Visit (HOSPITAL_BASED_OUTPATIENT_CLINIC_OR_DEPARTMENT_OTHER): Admit: 2013-09-01 | Payer: Self-pay | Admitting: Orthopedic Surgery

## 2013-09-01 SURGERY — CARPAL TUNNEL RELEASE
Anesthesia: Monitor Anesthesia Care | Laterality: Right

## 2014-03-21 ENCOUNTER — Ambulatory Visit: Payer: 59 | Admitting: Internal Medicine

## 2014-05-16 ENCOUNTER — Telehealth: Payer: Self-pay | Admitting: Family Medicine

## 2014-05-16 ENCOUNTER — Other Ambulatory Visit (INDEPENDENT_AMBULATORY_CARE_PROVIDER_SITE_OTHER): Payer: 59

## 2014-05-16 DIAGNOSIS — E7800 Pure hypercholesterolemia, unspecified: Secondary | ICD-10-CM

## 2014-05-16 LAB — LIPID PANEL
CHOL/HDL RATIO: 4
CHOLESTEROL: 188 mg/dL (ref 0–200)
HDL: 43.8 mg/dL (ref >39.00)
LDL Cholesterol: 130 mg/dL — ABNORMAL HIGH (ref 0–99)
NonHDL: 144.2
TRIGLYCERIDES: 73 mg/dL (ref 0.0–149.0)
VLDL: 14.6 mg/dL (ref 0.0–40.0)

## 2014-05-16 LAB — COMPREHENSIVE METABOLIC PANEL
ALBUMIN: 4.5 g/dL (ref 3.5–5.2)
ALT: 27 U/L (ref 0–53)
AST: 21 U/L (ref 0–37)
Alkaline Phosphatase: 56 U/L (ref 39–117)
BUN: 16 mg/dL (ref 6–23)
CALCIUM: 9.6 mg/dL (ref 8.4–10.5)
CHLORIDE: 100 meq/L (ref 96–112)
CO2: 31 mEq/L (ref 19–32)
Creatinine, Ser: 0.8 mg/dL (ref 0.4–1.5)
GFR: 111.43 mL/min (ref >60.00)
GLUCOSE: 86 mg/dL (ref 70–99)
POTASSIUM: 4.4 meq/L (ref 3.5–5.1)
Sodium: 138 mEq/L (ref 135–145)
TOTAL PROTEIN: 7.6 g/dL (ref 6.0–8.3)
Total Bilirubin: 0.8 mg/dL (ref 0.2–1.2)

## 2014-05-16 NOTE — Telephone Encounter (Signed)
Message copied by Jinny Sanders on Wed May 16, 2014  2:34 PM ------      Message from: Ellamae Sia      Created: Wed May 16, 2014 11:26 AM      Regarding: Lab orders asap       Patient is scheduled for CPX labs, please order future labs, Thanks , Aaron Malone       ------

## 2014-05-25 ENCOUNTER — Encounter: Payer: Self-pay | Admitting: Family Medicine

## 2014-05-25 ENCOUNTER — Ambulatory Visit (INDEPENDENT_AMBULATORY_CARE_PROVIDER_SITE_OTHER): Payer: 59 | Admitting: Family Medicine

## 2014-05-25 VITALS — BP 90/76 | HR 84 | Temp 98.2°F | Ht 66.0 in | Wt 193.2 lb

## 2014-05-25 DIAGNOSIS — E7800 Pure hypercholesterolemia, unspecified: Secondary | ICD-10-CM

## 2014-05-25 DIAGNOSIS — Z0000 Encounter for general adult medical examination without abnormal findings: Secondary | ICD-10-CM

## 2014-05-25 DIAGNOSIS — F321 Major depressive disorder, single episode, moderate: Secondary | ICD-10-CM

## 2014-05-25 MED ORDER — IPRATROPIUM BROMIDE 0.03 % NA SOLN: 2.0000 | Freq: Two times a day (BID) | NASAL | Status: DC

## 2014-05-25 NOTE — Progress Notes (Signed)
Pre visit review using our clinic review tool, if applicable. No additional management support is needed unless otherwise documented below in the visit note. 

## 2014-05-25 NOTE — Progress Notes (Signed)
HPI  The patient is here for annual wellness exam and preventative care.   Feeling well overall.  Elevated Cholesterol: LDL at goal <160.  Lab Results  Component Value Date   CHOL 188 05/16/2014   HDL 43.80 05/16/2014   LDLCALC 130* 05/16/2014   LDLDIRECT 147.8 05/03/2013   TRIG 73.0 05/16/2014   CHOLHDL 4 05/16/2014  Using medications without problems: None  Diet compliance: moderate  Exercise: None  Other complaints:   Depression: followed by psychiatry now, Dr. Caprice Beaver. Better control on pristiq. On paroxetine for premature ejaculation.   Right carpal tunnel Followed By Dr. Amedeo Plenty and Fredna Dow.  He has fit bit. My fitness pal. Following diet, Eating healthy. Wt Readings from Last 3 Encounters:  05/25/14 193 lb 4 oz (87.658 kg)  05/12/13 204 lb 8 oz (92.761 kg)  07/19/12 183 lb (83.008 kg)      Review of Systems  Constitutional: Negative for fever, fatigue and unexpected weight change.  HENT: Negative for ear pain, congestion, sore throat, rhinorrhea, trouble swallowing and postnasal drip.  Eyes: Negative for pain.  Respiratory: Negative for cough, shortness of breath and wheezing.  Cardiovascular: Negative for chest pain, palpitations and leg swelling.  Gastrointestinal: Negative for nausea, abdominal pain, diarrhea, constipation and blood in stool.  Genitourinary: Negative for dysuria, urgency, hematuria, discharge, penile swelling, scrotal swelling, difficulty urinating, penile pain and testicular pain.  Skin: Negative for rash.  Neurological: Negative for syncope, weakness, light-headedness, numbness and headaches.  Psychiatric/Behavioral: Negative for behavioral problems and dysphoric mood. The patient is not nervous/anxious.  Objective:   Physical Exam  Constitutional: He appears well-developed and well-nourished. Non-toxic appearance. He does not appear ill. No distress.  HENT:  Head: Normocephalic and atraumatic.  Right Ear: Hearing, tympanic membrane, external ear  and ear canal normal.  Left Ear: Hearing, tympanic membrane, external ear and ear canal normal.  Nose: Nose normal.  Mouth/Throat: Uvula is midline, oropharynx is clear and moist and mucous membranes are normal.  Eyes: Conjunctivae normal, EOM and lids are normal. Pupils are equal, round, and reactive to light. No foreign bodies found.  Neck: Trachea normal, normal range of motion and phonation normal. Neck supple. Carotid bruit is not present. No mass and no thyromegaly present.  Cardiovascular: Normal rate, regular rhythm, S1 normal, S2 normal, intact distal pulses and normal pulses. Exam reveals no gallop.  No murmur heard.  Pulmonary/Chest: Breath sounds normal. He has no wheezes. He has no rhonchi. He has no rales.  Abdominal: Soft. Normal appearance and bowel sounds are normal. There is no hepatosplenomegaly. There is no tenderness. There is no rebound, no guarding and no CVA tenderness. No hernia. Hernia confirmed negative in the right inguinal area and confirmed negative in the left inguinal area.  Genitourinary: Testes normal and penis normal. Right testis shows no mass and no tenderness. Left testis shows no mass and no tenderness. No paraphimosis or penile tenderness.  Lymphadenopathy:  He has no cervical adenopathy.  Right: No inguinal adenopathy present.  Left: No inguinal adenopathy present.  Neurological: He is alert. He has normal strength and normal reflexes. No cranial nerve deficit or sensory deficit. Gait normal.  Skin: Skin is warm, dry and intact. No rash noted.  Psychiatric: He has a normal mood and affect. His speech is normal and behavior is normal. Judgment normal.  Assessment & Plan:   The patient's preventative maintenance and recommended screening tests for an annual wellness exam were reviewed in full today.  Brought up to date unless  services declined.  Counselled on the importance of diet, exercise, and its role in overall health and mortality.  The patient's FH  and SH was reviewed, including their home life, tobacco status, and drug and alcohol status.   Vaccines: Uptodate td, flu at work  No family history of early colon or prostate cancer.  No desire for STD screen.   Nonsmoking.

## 2014-05-25 NOTE — Patient Instructions (Signed)
Keep up great work with healthy eating,  Exercise and weight loss.

## 2014-05-25 NOTE — Assessment & Plan Note (Signed)
At goal with lifestyle changes. 

## 2014-05-25 NOTE — Assessment & Plan Note (Signed)
Followed by D.r Dorie Rank on pristiq.

## 2014-05-25 NOTE — Addendum Note (Signed)
Addended by: Eliezer Lofts E on: 05/25/2014 10:58 AM   Modules accepted: Orders

## 2014-06-25 ENCOUNTER — Encounter: Payer: Self-pay | Admitting: Family Medicine

## 2014-06-25 MED ORDER — INDAPAMIDE 1.25 MG PO TABS: 1.2500 mg | ORAL_TABLET | ORAL | Status: DC

## 2014-07-17 ENCOUNTER — Other Ambulatory Visit: Payer: Self-pay | Admitting: Orthopedic Surgery

## 2014-08-07 ENCOUNTER — Encounter (HOSPITAL_BASED_OUTPATIENT_CLINIC_OR_DEPARTMENT_OTHER): Payer: Self-pay | Admitting: *Deleted

## 2014-08-07 NOTE — Pre-Procedure Instructions (Signed)
To come for BMET 

## 2014-08-22 ENCOUNTER — Encounter (HOSPITAL_BASED_OUTPATIENT_CLINIC_OR_DEPARTMENT_OTHER)
Admission: RE | Admit: 2014-08-22 | Discharge: 2014-08-22 | Disposition: A | Discharge status: Home / Self Care | Payer: 59 | Source: Ambulatory Visit | Attending: Orthopedic Surgery | Admitting: Orthopedic Surgery

## 2014-08-22 DIAGNOSIS — Z8349 Family history of other endocrine, nutritional and metabolic diseases: Secondary | ICD-10-CM | POA: Diagnosis not present

## 2014-08-22 DIAGNOSIS — Z8249 Family history of ischemic heart disease and other diseases of the circulatory system: Secondary | ICD-10-CM | POA: Diagnosis not present

## 2014-08-22 DIAGNOSIS — Z807 Family history of other malignant neoplasms of lymphoid, hematopoietic and related tissues: Secondary | ICD-10-CM | POA: Diagnosis not present

## 2014-08-22 DIAGNOSIS — G5601 Carpal tunnel syndrome, right upper limb: Secondary | ICD-10-CM | POA: Diagnosis present

## 2014-08-22 DIAGNOSIS — Z800 Family history of malignant neoplasm of digestive organs: Secondary | ICD-10-CM | POA: Diagnosis not present

## 2014-08-22 DIAGNOSIS — G5602 Carpal tunnel syndrome, left upper limb: Secondary | ICD-10-CM | POA: Diagnosis not present

## 2014-08-22 DIAGNOSIS — F321 Major depressive disorder, single episode, moderate: Secondary | ICD-10-CM | POA: Diagnosis not present

## 2014-08-22 DIAGNOSIS — Z833 Family history of diabetes mellitus: Secondary | ICD-10-CM | POA: Diagnosis not present

## 2014-08-22 DIAGNOSIS — Z8042 Family history of malignant neoplasm of prostate: Secondary | ICD-10-CM | POA: Diagnosis not present

## 2014-08-22 DIAGNOSIS — Z87442 Personal history of urinary calculi: Secondary | ICD-10-CM | POA: Diagnosis not present

## 2014-08-22 DIAGNOSIS — Z801 Family history of malignant neoplasm of trachea, bronchus and lung: Secondary | ICD-10-CM | POA: Diagnosis not present

## 2014-08-22 LAB — BASIC METABOLIC PANEL
Anion gap: 9 (ref 5–15)
BUN: 13 mg/dL (ref 6–23)
CALCIUM: 9.6 mg/dL (ref 8.4–10.5)
CO2: 33 mmol/L — AB (ref 19–32)
Chloride: 98 mEq/L (ref 96–112)
Creatinine, Ser: 0.87 mg/dL (ref 0.50–1.35)
GFR calc Af Amer: >90 mL/min (ref >90)
Glucose, Bld: 160 mg/dL — ABNORMAL HIGH (ref 70–99)
Potassium: 3.7 mmol/L (ref 3.5–5.1)
Sodium: 140 mmol/L (ref 135–145)

## 2014-08-23 ENCOUNTER — Other Ambulatory Visit: Payer: Self-pay | Admitting: Orthopedic Surgery

## 2014-08-24 ENCOUNTER — Ambulatory Visit (HOSPITAL_BASED_OUTPATIENT_CLINIC_OR_DEPARTMENT_OTHER)
Admission: RE | Admit: 2014-08-24 | Discharge: 2014-08-24 | Disposition: A | Discharge status: Home / Self Care | Payer: 59 | Source: Ambulatory Visit | Attending: Orthopedic Surgery | Admitting: Orthopedic Surgery

## 2014-08-24 ENCOUNTER — Encounter (HOSPITAL_BASED_OUTPATIENT_CLINIC_OR_DEPARTMENT_OTHER): Payer: Self-pay

## 2014-08-24 ENCOUNTER — Ambulatory Visit (HOSPITAL_BASED_OUTPATIENT_CLINIC_OR_DEPARTMENT_OTHER): Payer: 59 | Admitting: Anesthesiology

## 2014-08-24 ENCOUNTER — Encounter (HOSPITAL_BASED_OUTPATIENT_CLINIC_OR_DEPARTMENT_OTHER)
Admission: RE | Disposition: A | Discharge status: Home / Self Care | Payer: Self-pay | Source: Ambulatory Visit | Attending: Orthopedic Surgery

## 2014-08-24 DIAGNOSIS — G5601 Carpal tunnel syndrome, right upper limb: Secondary | ICD-10-CM | POA: Insufficient documentation

## 2014-08-24 DIAGNOSIS — Z800 Family history of malignant neoplasm of digestive organs: Secondary | ICD-10-CM | POA: Insufficient documentation

## 2014-08-24 DIAGNOSIS — Z8249 Family history of ischemic heart disease and other diseases of the circulatory system: Secondary | ICD-10-CM | POA: Insufficient documentation

## 2014-08-24 DIAGNOSIS — F321 Major depressive disorder, single episode, moderate: Secondary | ICD-10-CM | POA: Insufficient documentation

## 2014-08-24 DIAGNOSIS — G5602 Carpal tunnel syndrome, left upper limb: Secondary | ICD-10-CM | POA: Insufficient documentation

## 2014-08-24 DIAGNOSIS — Z833 Family history of diabetes mellitus: Secondary | ICD-10-CM | POA: Insufficient documentation

## 2014-08-24 DIAGNOSIS — Z801 Family history of malignant neoplasm of trachea, bronchus and lung: Secondary | ICD-10-CM | POA: Insufficient documentation

## 2014-08-24 DIAGNOSIS — Z8349 Family history of other endocrine, nutritional and metabolic diseases: Secondary | ICD-10-CM | POA: Insufficient documentation

## 2014-08-24 DIAGNOSIS — Z807 Family history of other malignant neoplasms of lymphoid, hematopoietic and related tissues: Secondary | ICD-10-CM | POA: Insufficient documentation

## 2014-08-24 DIAGNOSIS — Z87442 Personal history of urinary calculi: Secondary | ICD-10-CM | POA: Insufficient documentation

## 2014-08-24 DIAGNOSIS — Z8042 Family history of malignant neoplasm of prostate: Secondary | ICD-10-CM | POA: Insufficient documentation

## 2014-08-24 HISTORY — PX: CARPAL TUNNEL RELEASE: SHX101

## 2014-08-24 HISTORY — DX: Personal history of urinary calculi: Z87.442

## 2014-08-24 HISTORY — DX: Carpal tunnel syndrome, right upper limb: G56.01

## 2014-08-24 LAB — POCT HEMOGLOBIN-HEMACUE: Hemoglobin: 15.4 g/dL (ref 13.0–17.0)

## 2014-08-24 SURGERY — CARPAL TUNNEL RELEASE
Anesthesia: Monitor Anesthesia Care | Site: Wrist | Laterality: Bilateral

## 2014-08-24 MED ORDER — FENTANYL CITRATE 0.05 MG/ML IJ SOLN: 50.0000 ug | INTRAMUSCULAR | Status: DC | PRN

## 2014-08-24 MED ORDER — OXYCODONE HCL 5 MG/5ML PO SOLN: 5.0000 mg | Freq: Once | ORAL | Status: DC | PRN

## 2014-08-24 MED ORDER — MIDAZOLAM HCL 2 MG/2ML IJ SOLN: 1.0000 mg | INTRAMUSCULAR | Status: DC | PRN

## 2014-08-24 MED ORDER — CEFAZOLIN SODIUM-DEXTROSE 2-3 GM-% IV SOLR
INTRAVENOUS | Status: AC
  Filled 2014-08-24: qty 50

## 2014-08-24 MED ORDER — BETAMETHASONE SOD PHOS & ACET 6 (3-3) MG/ML IJ SUSP
INTRAMUSCULAR | Status: AC
  Filled 2014-08-24: qty 1

## 2014-08-24 MED ORDER — LACTATED RINGERS IV SOLN
INTRAVENOUS | Status: DC
  Administered 2014-08-24 (×2): via INTRAVENOUS

## 2014-08-24 MED ORDER — CHLORHEXIDINE GLUCONATE 4 % EX LIQD: 60.0000 mL | Freq: Once | CUTANEOUS | Status: DC

## 2014-08-24 MED ORDER — FENTANYL CITRATE 0.05 MG/ML IJ SOLN
INTRAMUSCULAR | Status: AC
  Filled 2014-08-24: qty 6

## 2014-08-24 MED ORDER — OXYCODONE HCL 5 MG PO TABS: 5.0000 mg | ORAL_TABLET | Freq: Once | ORAL | Status: DC | PRN

## 2014-08-24 MED ORDER — FENTANYL CITRATE 0.05 MG/ML IJ SOLN
INTRAMUSCULAR | Status: DC | PRN
  Administered 2014-08-24 (×2): 50 ug via INTRAVENOUS

## 2014-08-24 MED ORDER — MIDAZOLAM HCL 2 MG/2ML IJ SOLN
INTRAMUSCULAR | Status: AC
  Filled 2014-08-24: qty 2

## 2014-08-24 MED ORDER — HYDROCODONE-ACETAMINOPHEN 5-325 MG PO TABS: 2.0000 | ORAL_TABLET | Freq: Four times a day (QID) | ORAL | Status: DC | PRN

## 2014-08-24 MED ORDER — KETOROLAC TROMETHAMINE 30 MG/ML IJ SOLN: 30.0000 mg | Freq: Once | INTRAMUSCULAR | Status: DC | PRN

## 2014-08-24 MED ORDER — SODIUM BICARBONATE 4 % IV SOLN
INTRAVENOUS | Status: DC | PRN
  Administered 2014-08-24: 1 mL via INTRAVENOUS

## 2014-08-24 MED ORDER — SODIUM CHLORIDE 0.45 % IV SOLN: INTRAVENOUS | Status: DC

## 2014-08-24 MED ORDER — FENTANYL CITRATE 0.05 MG/ML IJ SOLN: 25.0000 ug | INTRAMUSCULAR | Status: DC | PRN

## 2014-08-24 MED ORDER — CEFAZOLIN SODIUM-DEXTROSE 2-3 GM-% IV SOLR
INTRAVENOUS | Status: DC | PRN
  Administered 2014-08-24: 2 g via INTRAVENOUS

## 2014-08-24 MED ORDER — BUPIVACAINE HCL (PF) 0.25 % IJ SOLN
INTRAMUSCULAR | Status: DC | PRN
  Administered 2014-08-24: 5 mL

## 2014-08-24 MED ORDER — PROMETHAZINE HCL 25 MG/ML IJ SOLN: 6.2500 mg | INTRAMUSCULAR | Status: DC | PRN

## 2014-08-24 MED ORDER — MIDAZOLAM HCL 2 MG/ML PO SYRP: 12.0000 mg | ORAL_SOLUTION | Freq: Once | ORAL | Status: DC | PRN

## 2014-08-24 MED ORDER — LIDOCAINE HCL (PF) 1 % IJ SOLN
INTRAMUSCULAR | Status: DC | PRN
  Administered 2014-08-24: 5 mL

## 2014-08-24 MED ORDER — PROPOFOL 10 MG/ML IV EMUL
INTRAVENOUS | Status: AC
  Filled 2014-08-24: qty 50

## 2014-08-24 MED ORDER — CEFAZOLIN SODIUM-DEXTROSE 2-3 GM-% IV SOLR: 2.0000 g | INTRAVENOUS | Status: DC

## 2014-08-24 MED ORDER — PROPOFOL 10 MG/ML IV BOLUS
INTRAVENOUS | Status: AC
  Filled 2014-08-24: qty 100

## 2014-08-24 MED ORDER — MIDAZOLAM HCL 5 MG/5ML IJ SOLN
INTRAMUSCULAR | Status: DC | PRN
  Administered 2014-08-24: 1 mg via INTRAVENOUS
  Administered 2014-08-24 (×2): 0.5 mg via INTRAVENOUS

## 2014-08-24 MED ORDER — BETAMETHASONE SOD PHOS & ACET 6 (3-3) MG/ML IJ SUSP
INTRAMUSCULAR | Status: DC | PRN
  Administered 2014-08-24: 6 mg via INTRA_ARTICULAR

## 2014-08-24 SURGICAL SUPPLY — 47 items
BANDAGE ELASTIC 3 VELCRO ST LF (GAUZE/BANDAGES/DRESSINGS) ×3 IMPLANT
BLADE CARPAL TUNNEL SNGL USE (BLADE) ×3 IMPLANT
BLADE SURG 15 STRL LF DISP TIS (BLADE) ×2 IMPLANT
BLADE SURG 15 STRL SS (BLADE) ×6
BNDG CMPR MD 5X2 ELC HKLP STRL (GAUZE/BANDAGES/DRESSINGS) ×1
BNDG CONFORM 3 STRL LF (GAUZE/BANDAGES/DRESSINGS) ×3 IMPLANT
BNDG ELASTIC 2 VLCR STRL LF (GAUZE/BANDAGES/DRESSINGS) ×2 IMPLANT
BRUSH SCRUB EZ PLAIN DRY (MISCELLANEOUS) ×3 IMPLANT
CORDS BIPOLAR (ELECTRODE) ×3 IMPLANT
COVER BACK TABLE 60X90IN (DRAPES) ×3 IMPLANT
COVER MAYO STAND STRL (DRAPES) ×3 IMPLANT
CUFF TOURNIQUET SINGLE 18IN (TOURNIQUET CUFF) ×2 IMPLANT
DRAIN PENROSE 1/4X12 LTX STRL (WOUND CARE) IMPLANT
DRAPE EXTREMITY T 121X128X90 (DRAPE) ×3 IMPLANT
DRAPE SURG 17X23 STRL (DRAPES) ×3 IMPLANT
DRSG EMULSION OIL 3X3 NADH (GAUZE/BANDAGES/DRESSINGS) ×3 IMPLANT
GAUZE SPONGE 4X4 12PLY STRL (GAUZE/BANDAGES/DRESSINGS) ×2 IMPLANT
GAUZE SPONGE 4X4 16PLY XRAY LF (GAUZE/BANDAGES/DRESSINGS) IMPLANT
GAUZE XEROFORM 1X8 LF (GAUZE/BANDAGES/DRESSINGS) ×3 IMPLANT
GLOVE BIOGEL M STRL SZ7.5 (GLOVE) ×3 IMPLANT
GLOVE SS BIOGEL STRL SZ 8 (GLOVE) ×1 IMPLANT
GLOVE SUPERSENSE BIOGEL SZ 8 (GLOVE) ×2
GOWN STRL REUS W/ TWL LRG LVL3 (GOWN DISPOSABLE) ×1 IMPLANT
GOWN STRL REUS W/ TWL XL LVL3 (GOWN DISPOSABLE) ×1 IMPLANT
GOWN STRL REUS W/TWL LRG LVL3 (GOWN DISPOSABLE) ×3
GOWN STRL REUS W/TWL XL LVL3 (GOWN DISPOSABLE) ×3
LOOP VESSEL MAXI BLUE (MISCELLANEOUS) IMPLANT
NDL HYPO 25X1 1.5 SAFETY (NEEDLE) ×2 IMPLANT
NDL SAFETY ECLIPSE 18X1.5 (NEEDLE) ×1 IMPLANT
NEEDLE HYPO 18GX1.5 SHARP (NEEDLE) ×3
NEEDLE HYPO 22GX1.5 SAFETY (NEEDLE) IMPLANT
NEEDLE HYPO 25X1 1.5 SAFETY (NEEDLE) ×9 IMPLANT
NS IRRIG 1000ML POUR BTL (IV SOLUTION) ×3 IMPLANT
PACK BASIN DAY SURGERY FS (CUSTOM PROCEDURE TRAY) ×3 IMPLANT
PAD ALCOHOL SWAB (MISCELLANEOUS) ×24 IMPLANT
PAD CAST 3X4 CTTN HI CHSV (CAST SUPPLIES) ×2 IMPLANT
PADDING CAST ABS 4INX4YD NS (CAST SUPPLIES) ×2
PADDING CAST ABS COTTON 4X4 ST (CAST SUPPLIES) ×1 IMPLANT
PADDING CAST COTTON 3X4 STRL (CAST SUPPLIES) ×6
STOCKINETTE 4X48 STRL (DRAPES) ×3 IMPLANT
SUT PROLENE 4 0 PS 2 18 (SUTURE) ×3 IMPLANT
SYR BULB 3OZ (MISCELLANEOUS) ×3 IMPLANT
SYR CONTROL 10ML LL (SYRINGE) ×6 IMPLANT
TOWEL OR 17X24 6PK STRL BLUE (TOWEL DISPOSABLE) ×3 IMPLANT
TOWEL OR NON WOVEN STRL DISP B (DISPOSABLE) ×3 IMPLANT
TRAY DSU PREP LF (CUSTOM PROCEDURE TRAY) ×3 IMPLANT
UNDERPAD 30X30 INCONTINENT (UNDERPADS AND DIAPERS) ×3 IMPLANT

## 2014-08-24 NOTE — Op Note (Signed)
See dictation #[id number] Amedeo Plenty MD

## 2014-08-24 NOTE — Anesthesia Procedure Notes (Signed)
Procedure Name: MAC Date/Time: 08/24/2014 8:05 AM Performed by: Marrianne Mood Pre-anesthesia Checklist: Patient identified, Timeout performed, Emergency Drugs available, Suction available and Patient being monitored Patient Re-evaluated:Patient Re-evaluated prior to inductionOxygen Delivery Method: Simple face mask Preoxygenation: Pre-oxygenation with 100% oxygen

## 2014-08-24 NOTE — H&P (Signed)
Aaron Malone is an 39 y.o. male.   Chief Complaint: bil CTS HPI: presents for R CTR and L CT injection  Patient presents for evaluation and treatment of the of their upper extremity predicament. The patient denies neck back chest or of abdominal pain. The patient notes that they have no lower extremity problems. The patient from primarily complains of the upper extremity pain noted.  Past Medical History  Diagnosis Date  . Major depressive disorder, single episode, moderate   . History of kidney stones   . Carpal tunnel syndrome of right wrist 07/2014    Past Surgical History  Procedure Laterality Date  . Lithotripsy  12/2003, 09/2006  . Cholecystectomy  2012  . Trigger finger release      thumb  . Inguinal hernia repair Bilateral 1983    Family History  Problem Relation Age of Onset  . Hyperlipidemia Mother   . Hypertension Mother   . Polycystic ovary syndrome Sister   . Cancer Maternal Grandmother     Colon and Lung  . Cancer Maternal Grandfather     Prostate  . Heart failure Maternal Grandfather   . Heart disease Maternal Grandfather   . Multiple myeloma Paternal Grandfather   . Diabetes Paternal Grandmother    Social History:  reports that he has never smoked. He has never used smokeless tobacco. He reports that he drinks alcohol. He reports that he does not use illicit drugs.  Allergies: No Known Allergies  Medications Prior to Admission  Medication Sig Dispense Refill  . desvenlafaxine (PRISTIQ) 100 MG 24 hr tablet Take 100 mg by mouth daily.    Marland Kitchen ibuprofen (ADVIL,MOTRIN) 600 MG tablet Take 600 mg by mouth every 6 (six) hours as needed.      . indapamide (LOZOL) 1.25 MG tablet Take 1 tablet (1.25 mg total) by mouth every morning. 90 tablet 3  . Turmeric 450 MG CAPS Take 450 mg by mouth daily.      Results for orders placed or performed during the hospital encounter of 08/24/14 (from the past 48 hour(s))  Basic metabolic panel     Status: Abnormal   Collection Time: 08/22/14  8:35 AM  Result Value Ref Range   Sodium 140 135 - 145 mmol/L    Comment: Please note change in reference range.   Potassium 3.7 3.5 - 5.1 mmol/L    Comment: Please note change in reference range.   Chloride 98 96 - 112 mEq/L   CO2 33 (H) 19 - 32 mmol/L   Glucose, Bld 160 (H) 70 - 99 mg/dL   BUN 13 6 - 23 mg/dL   Creatinine, Ser 0.87 0.50 - 1.35 mg/dL   Calcium 9.6 8.4 - 10.5 mg/dL   GFR calc non Af Amer >90 >90 mL/min   GFR calc Af Amer >90 >90 mL/min    Comment: (NOTE) The eGFR has been calculated using the CKD EPI equation. This calculation has not been validated in all clinical situations. eGFR's persistently <90 mL/min signify possible Chronic Kidney Disease.    Anion gap 9 5 - 15  Hemoglobin-hemacue, POC     Status: None   Collection Time: 08/24/14  7:03 AM  Result Value Ref Range   Hemoglobin 15.4 13.0 - 17.0 g/dL   No results found.  Review of Systems  Eyes: Negative.   Respiratory: Negative.   Gastrointestinal: Negative.   Genitourinary: Negative.   Neurological: Negative.   Psychiatric/Behavioral: Negative.     Blood pressure 133/92, pulse 86,  temperature 97.7 F (36.5 C), resp. rate 18, height _0  (1.702 m), weight 92.08 kg (203 lb), SpO2 100 %. Physical Exam Bil CTS  The patient is alert and oriented in no acute distress the patient complains of pain in the affected upper extremity.  The patient is noted to have a normal HEENT exam.  Lung fields show equal chest expansion and no shortness of breath  abdomen exam is nontender without distention.  Lower extremity examination does not show any fracture dislocation or blood clot symptoms.  Pelvis is stable neck and back are stable and nontender  Assessment/Plan We are planning surgery for your upper extremity. The risk and benefits of surgery include risk of bleeding infection anesthesia damage to normal structures and failure of the surgery to accomplish its intended goals of  relieving symptoms and restoring function with this in mind we'll going to proceed. I have specifically discussed with the patient the pre-and postoperative regime and the does and don'ts and risk and benefits in great detail. Risk and benefits of surgery also include risk of dystrophy chronic nerve pain failure of the healing process to go onto completion and other inherent risks of surgery The relavent the pathophysiology of the disease/injury process, as well as the alternatives for treatment and postoperative course of action has been discussed in great detail with the patient who desires to proceed.  We will do everything in our power to help you (the patient) restore function to the upper extremity. Is a pleasure to see this patient today.   Litha Lamartina III,Lillia Lengel M 08/24/2014, 8:02 AM

## 2014-08-24 NOTE — Transfer of Care (Signed)
Immediate Anesthesia Transfer of Care Note  Patient: Aaron Malone  Procedure(s) Performed: Procedure(s): RIGHT LIMITED OPEN CARPAL TUNNEL RELEASE, LEFT CARPAL  TUNNEL INJECTION (Bilateral)  Patient Location: PACU  Anesthesia Type:MAC  Level of Consciousness: awake, alert , oriented and patient cooperative  Airway & Oxygen Therapy: Patient Spontanous Breathing and Patient connected to face mask oxygen  Post-op Assessment: Report given to PACU RN and Post -op Vital signs reviewed and stable  Post vital signs: Reviewed and stable  Complications: No apparent anesthesia complications

## 2014-08-24 NOTE — Anesthesia Preprocedure Evaluation (Addendum)
Anesthesia Evaluation  Patient identified by MRN, date of birth, ID band Patient awake    Reviewed: Allergy & Precautions, NPO status , Patient's Chart, lab work & pertinent test results  Airway Mallampati: I  TM Distance: >3 FB Neck ROM: Full    Dental  (+) Teeth Intact   Pulmonary neg pulmonary ROS,          Cardiovascular negative cardio ROS      Neuro/Psych Depression negative neurological ROS     GI/Hepatic negative GI ROS, Neg liver ROS,   Endo/Other  Morbid obesity  Renal/GU negative Renal ROS     Musculoskeletal negative musculoskeletal ROS (+)   Abdominal   Peds  Hematology negative hematology ROS (+)   Anesthesia Other Findings   Reproductive/Obstetrics                            Anesthesia Physical Anesthesia Plan  ASA: I  Anesthesia Plan: MAC   Post-op Pain Management:    Induction: Intravenous  Airway Management Planned: Simple Face Mask and Natural Airway  Additional Equipment:   Intra-op Plan:   Post-operative Plan:   Informed Consent: I have reviewed the patients History and Physical, chart, labs and discussed the procedure including the risks, benefits and alternatives for the proposed anesthesia with the patient or authorized representative who has indicated his/her understanding and acceptance.   Dental advisory given  Plan Discussed with: CRNA  Anesthesia Plan Comments:        Anesthesia Quick Evaluation

## 2014-08-24 NOTE — Discharge Instructions (Signed)
Keep bandage clean and dry.  Call for any problems.  No smoking.  Criteria for driving a car: you should be off your pain medicine for 7-8 hours, able to drive one handed(confident), thinking clearly and feeling able in your judgement to drive. Continue elevation as it will decrease swelling.  If instructed by MD move your fingers within the confines of the bandage/splint.  Use ice if instructed by your MD. Call immediately for any sudden loss of feeling in your hand/arm or change in functional abilities of the extremity.  We recommend that you to take vitamin C 1000 mg a day to promote healing we also recommend that if you require her pain medicine that he take a stool softener to prevent constipation as most pain medicines will have constipation side effects. We recommend either Peri-Colace or Senokot and recommend that you also consider adding MiraLAX to prevent the constipation affects from pain medicine if you are required to use them. These medicines are over the counter and maybe purchased at a local pharmacy.  We also recommend vitamin B 6 200 mg a day to promote nerve recovery  Post Anesthesia Home Care Instructions  Activity: Get plenty of rest for the remainder of the day. A responsible adult should stay with you for 24 hours following the procedure.  For the next 24 hours, DO NOT: -Drive a car -Paediatric nurse -Drink alcoholic beverages -Take any medication unless instructed by your physician -Make any legal decisions or sign important papers.  Meals: Start with liquid foods such as gelatin or soup. Progress to regular foods as tolerated. Avoid greasy, spicy, heavy foods. If nausea and/or vomiting occur, drink only clear liquids until the nausea and/or vomiting subsides. Call your physician if vomiting continues.  Special Instructions/Symptoms: Your throat may feel dry or sore from the anesthesia or the breathing tube placed in your throat during surgery. If this causes  discomfort, gargle with warm salt water. The discomfort should disappear within 24 hours.

## 2014-08-24 NOTE — Anesthesia Postprocedure Evaluation (Signed)
  Anesthesia Post-op Note  Patient: Aaron Malone  Procedure(s) Performed: Procedure(s): RIGHT LIMITED OPEN CARPAL TUNNEL RELEASE, LEFT CARPAL  TUNNEL INJECTION (Bilateral)  Patient Location: PACU  Anesthesia Type: MAC  Level of Consciousness: awake and alert   Airway and Oxygen Therapy: Patient Spontanous Breathing  Post-op Pain: none  Post-op Assessment: Post-op Vital signs reviewed, Patient's Cardiovascular Status Stable and Respiratory Function Stable  Post-op Vital Signs: Reviewed  Filed Vitals:   08/24/14 0940  BP: 125/80  Pulse: 76  Temp: 36.8 C  Resp: 16    Complications: No apparent anesthesia complications

## 2014-08-27 ENCOUNTER — Encounter (HOSPITAL_BASED_OUTPATIENT_CLINIC_OR_DEPARTMENT_OTHER): Payer: Self-pay | Admitting: Orthopedic Surgery

## 2014-08-27 NOTE — Op Note (Signed)
NAME:  Aaron Malone, Aaron Malone         ACCOUNT NO.:  [id number]  MEDICAL RECORD NO.:  [id number]  LOCATION:                                 FACILITY:  PHYSICIAN:  Satira Anis. Malikai Gut, M.D.DATE OF BIRTH:  Dec 16, 1975  DATE OF PROCEDURE:  08/24/2014 DATE OF DISCHARGE:  08/24/2014                              OPERATIVE REPORT   PREOPERATIVE DIAGNOSIS:  Bilateral carpal tunnel syndrome.  POSTOPERATIVE DIAGNOSIS:  Bilateral carpal tunnel syndrome.  PROCEDURE:  Right median nerve/peripheral nerve block wrist forearm level: 1. For anesthetic purposes for carpal tunnel release. 2. Right limited open carpal tunnel release. 3. Left carpal tunnel injection with 1 mL of lidocaine, 1 mL of     Celestone, 1 mL of Sensorcaine.  SURGEON:  Satira Anis. Amedeo Plenty, MD  ASSISTANT:  None.  COMPLICATIONS:  None.  ANESTHESIA:  Peripheral nerve block with IV sedation keeping the patient awake, alert, and oriented the entire case.  TOURNIQUET TIME:  Less than 10 minutes.  INDICATIONS:  This patient is a pleasant male, who presents with the above-mentioned diagnosis.  I have counseled him in regard to risks and benefits of surgery including risk of infection, bleeding, anesthesia, damage to normal structures, and failure of surgery to accomplish its intended goals, relieving symptoms, and restoring function.  With this in mind, he desires to proceed.  All questions have been encouraged and answered preoperatively.  OPERATIVE PROCEDURE:  The patient was seen by myself and Anesthesia, taken to the operative theater, underwent a smooth induction of peripheral nerve/median nerve block about the right wrist.  The patient had a median nerve/peripheral nerve block.  Time-out was called. Preoperative antibiotics were given.  The arm was then prepped and draped.  Following this, arm was elevated, tourniquet was insufflated to 250 mmHg.  A 1 cm incision was made at the distal edge of transcarpal ligament.   Dissection was carried down.  Retractor placed.  Distal edge of the transverse carpal ligament was released under 4.0 Loupe magnification.  Fat pad egressed nicely.  Superficial palmar arch carefully protected and following this, distal proximal dissection was carried out intact __________ was available for carrying out __________ device 1, 2, and 3 which were placed just under the proximal leading leaflet of the transverse carpal ligament.  Following this, with the patient awake, alert, and oriented, I then performed placement of the security clip, obturator disengaged, security knife was placed and security clip effectively releasing the proximal leaflet.  The patient tolerated this well.  There were no complicating features.  Once this was complete, we identified the canal, it was nicely released and decompressed.  Median nerve was hyperemic, intact, and decompressed nicely.  There were no complications.  He was awake, alert, and oriented and had no dysesthesia or other issues.  I irrigated copiously, secured hemostasis, and closed the wound with Prolene.  Sterile dressing was applied.  We asked him to elevate, move, and massage fingers and went over the postop protocol.  Following this, I then prepped and draped the left wrist and performed a carpal tunnel injection with 1 mL of Celestone, 1 mL of lidocaine, and 1 mL of Sensorcaine without epinephrine.  He tolerated this well.  There were  no complicating features.  It was an absolute pleasure to see him today and participate in his care.  We look forward for participating in his postop recovery.  These notes have been discussed and all questions have been encouraged and answered.  We will see him in a week, therapy in 12 days.     Satira Anis. Amedeo Plenty, M.D.     [facility]  D:  08/24/2014  T:  08/24/2014  Job:  [id number]

## 2014-09-26 ENCOUNTER — Encounter: Payer: Self-pay | Admitting: Family Medicine

## 2014-09-26 MED ORDER — FLUTICASONE PROPIONATE 50 MCG/ACT NA SUSP: 2.0000 | Freq: Every day | NASAL | Status: DC

## 2014-11-30 ENCOUNTER — Encounter: Payer: Self-pay | Admitting: Family Medicine

## 2014-11-30 MED ORDER — CEPHALEXIN 500 MG PO CAPS: 500.0000 mg | ORAL_CAPSULE | Freq: Three times a day (TID) | ORAL | Status: DC

## 2015-04-30 ENCOUNTER — Other Ambulatory Visit: Payer: Self-pay | Admitting: Orthopedic Surgery

## 2015-05-29 ENCOUNTER — Other Ambulatory Visit: Payer: 59

## 2015-06-07 ENCOUNTER — Encounter: Payer: 59 | Admitting: Family Medicine

## 2015-06-20 ENCOUNTER — Ambulatory Visit (HOSPITAL_BASED_OUTPATIENT_CLINIC_OR_DEPARTMENT_OTHER): Payer: 59 | Admitting: Anesthesiology

## 2015-06-20 ENCOUNTER — Encounter (HOSPITAL_BASED_OUTPATIENT_CLINIC_OR_DEPARTMENT_OTHER)
Admission: RE | Disposition: A | Discharge status: Home / Self Care | Payer: Self-pay | Source: Ambulatory Visit | Attending: Orthopedic Surgery

## 2015-06-20 ENCOUNTER — Encounter (HOSPITAL_BASED_OUTPATIENT_CLINIC_OR_DEPARTMENT_OTHER): Payer: Self-pay | Admitting: *Deleted

## 2015-06-20 ENCOUNTER — Ambulatory Visit (HOSPITAL_BASED_OUTPATIENT_CLINIC_OR_DEPARTMENT_OTHER)
Admission: RE | Admit: 2015-06-20 | Discharge: 2015-06-20 | Disposition: A | Discharge status: Home / Self Care | Payer: 59 | Source: Ambulatory Visit | Attending: Orthopedic Surgery | Admitting: Orthopedic Surgery

## 2015-06-20 DIAGNOSIS — G5603 Carpal tunnel syndrome, bilateral upper limbs: Secondary | ICD-10-CM | POA: Insufficient documentation

## 2015-06-20 DIAGNOSIS — G5602 Carpal tunnel syndrome, left upper limb: Secondary | ICD-10-CM | POA: Diagnosis present

## 2015-06-20 HISTORY — PX: CARPAL TUNNEL RELEASE: SHX101

## 2015-06-20 SURGERY — CARPAL TUNNEL RELEASE
Anesthesia: Monitor Anesthesia Care | Site: Wrist | Laterality: Left

## 2015-06-20 MED ORDER — MIDAZOLAM HCL 2 MG/2ML IJ SOLN
1.0000 mg | INTRAMUSCULAR | Status: DC | PRN
  Administered 2015-06-20 (×2): 1 mg via INTRAVENOUS

## 2015-06-20 MED ORDER — FENTANYL CITRATE (PF) 100 MCG/2ML IJ SOLN
50.0000 ug | INTRAMUSCULAR | Status: DC | PRN
  Administered 2015-06-20 (×2): 50 ug via INTRAVENOUS

## 2015-06-20 MED ORDER — HYDROMORPHONE HCL 1 MG/ML IJ SOLN: 0.2500 mg | INTRAMUSCULAR | Status: DC | PRN

## 2015-06-20 MED ORDER — HYDROCODONE-ACETAMINOPHEN 5-325 MG PO TABS: 1.0000 | ORAL_TABLET | Freq: Four times a day (QID) | ORAL | Status: DC | PRN

## 2015-06-20 MED ORDER — SODIUM BICARBONATE 4 % IV SOLN
INTRAVENOUS | Status: AC
  Filled 2015-06-20: qty 5

## 2015-06-20 MED ORDER — LIDOCAINE HCL (PF) 1 % IJ SOLN
INTRAMUSCULAR | Status: AC
  Filled 2015-06-20: qty 30

## 2015-06-20 MED ORDER — CEFAZOLIN SODIUM-DEXTROSE 2-3 GM-% IV SOLR
2.0000 g | INTRAVENOUS | Status: AC
Start: 2015-06-21 — End: 2015-06-20
  Administered 2015-06-20: 2 g via INTRAVENOUS

## 2015-06-20 MED ORDER — ONDANSETRON HCL 4 MG/2ML IJ SOLN
INTRAMUSCULAR | Status: AC
  Filled 2015-06-20: qty 2

## 2015-06-20 MED ORDER — LACTATED RINGERS IV SOLN
INTRAVENOUS | Status: DC
  Administered 2015-06-20: 14:00:00 via INTRAVENOUS

## 2015-06-20 MED ORDER — MEPERIDINE HCL 25 MG/ML IJ SOLN: 6.2500 mg | INTRAMUSCULAR | Status: DC | PRN

## 2015-06-20 MED ORDER — CHLORHEXIDINE GLUCONATE 4 % EX LIQD: 60.0000 mL | Freq: Once | CUTANEOUS | Status: DC

## 2015-06-20 MED ORDER — OXYCODONE HCL 5 MG/5ML PO SOLN
5.0000 mg | Freq: Once | ORAL | Status: DC | PRN
Start: 2015-06-20 — End: 2015-06-20

## 2015-06-20 MED ORDER — SCOPOLAMINE 1 MG/3DAYS TD PT72: 1.0000 | MEDICATED_PATCH | Freq: Once | TRANSDERMAL | Status: DC | PRN

## 2015-06-20 MED ORDER — MIDAZOLAM HCL 2 MG/2ML IJ SOLN
INTRAMUSCULAR | Status: AC
  Filled 2015-06-20: qty 4

## 2015-06-20 MED ORDER — FENTANYL CITRATE (PF) 100 MCG/2ML IJ SOLN
INTRAMUSCULAR | Status: AC
  Filled 2015-06-20: qty 4

## 2015-06-20 MED ORDER — ONDANSETRON HCL 4 MG/2ML IJ SOLN
INTRAMUSCULAR | Status: DC | PRN
  Administered 2015-06-20: 4 mg via INTRAVENOUS

## 2015-06-20 MED ORDER — OXYCODONE HCL 5 MG PO TABS: 5.0000 mg | ORAL_TABLET | Freq: Once | ORAL | Status: DC | PRN

## 2015-06-20 MED ORDER — PROPOFOL 10 MG/ML IV BOLUS
INTRAVENOUS | Status: AC
  Filled 2015-06-20: qty 20

## 2015-06-20 MED ORDER — LIDOCAINE HCL 1 % IJ SOLN
INTRAMUSCULAR | Status: DC | PRN
  Administered 2015-06-20: 20 mL via INTRAMUSCULAR

## 2015-06-20 MED ORDER — CEFAZOLIN SODIUM-DEXTROSE 2-3 GM-% IV SOLR
INTRAVENOUS | Status: AC
  Filled 2015-06-20: qty 50

## 2015-06-20 MED ORDER — GLYCOPYRROLATE 0.2 MG/ML IJ SOLN: 0.2000 mg | Freq: Once | INTRAMUSCULAR | Status: DC | PRN

## 2015-06-20 SURGICAL SUPPLY — 45 items
BANDAGE ELASTIC 3 VELCRO ST LF (GAUZE/BANDAGES/DRESSINGS) ×3 IMPLANT
BLADE CARPAL TUNNEL SNGL USE (BLADE) ×3 IMPLANT
BLADE SURG 15 STRL LF DISP TIS (BLADE) ×2 IMPLANT
BLADE SURG 15 STRL SS (BLADE) ×6
BNDG CONFORM 3 STRL LF (GAUZE/BANDAGES/DRESSINGS) ×3 IMPLANT
BRUSH SCRUB EZ PLAIN DRY (MISCELLANEOUS) ×3 IMPLANT
CORDS BIPOLAR (ELECTRODE) ×3 IMPLANT
COVER BACK TABLE 60X90IN (DRAPES) ×3 IMPLANT
CUFF TOURNIQUET SINGLE 18IN (TOURNIQUET CUFF) ×2 IMPLANT
DRAIN PENROSE 1/4X12 LTX STRL (WOUND CARE) IMPLANT
DRAPE EXTREMITY T 121X128X90 (DRAPE) ×3 IMPLANT
DRAPE SURG 17X23 STRL (DRAPES) ×3 IMPLANT
DRSG EMULSION OIL 3X3 NADH (GAUZE/BANDAGES/DRESSINGS) ×3 IMPLANT
GAUZE SPONGE 4X4 12PLY STRL (GAUZE/BANDAGES/DRESSINGS) IMPLANT
GAUZE SPONGE 4X4 16PLY XRAY LF (GAUZE/BANDAGES/DRESSINGS) IMPLANT
GAUZE XEROFORM 1X8 LF (GAUZE/BANDAGES/DRESSINGS) ×3 IMPLANT
GLOVE BIOGEL M STRL SZ7.5 (GLOVE) IMPLANT
GLOVE SS BIOGEL STRL SZ 8 (GLOVE) ×1 IMPLANT
GLOVE SUPERSENSE BIOGEL SZ 8 (GLOVE) ×4
GOWN STRL REUS W/ TWL LRG LVL3 (GOWN DISPOSABLE) ×1 IMPLANT
GOWN STRL REUS W/ TWL XL LVL3 (GOWN DISPOSABLE) ×1 IMPLANT
GOWN STRL REUS W/TWL LRG LVL3 (GOWN DISPOSABLE) ×3
GOWN STRL REUS W/TWL XL LVL3 (GOWN DISPOSABLE) ×3
LOOP VESSEL MAXI BLUE (MISCELLANEOUS) IMPLANT
NDL HYPO 25X1 1.5 SAFETY (NEEDLE) ×2 IMPLANT
NDL SAFETY ECLIPSE 18X1.5 (NEEDLE) ×1 IMPLANT
NEEDLE HYPO 18GX1.5 SHARP (NEEDLE) ×3
NEEDLE HYPO 22GX1.5 SAFETY (NEEDLE) IMPLANT
NEEDLE HYPO 25X1 1.5 SAFETY (NEEDLE) ×6 IMPLANT
NS IRRIG 1000ML POUR BTL (IV SOLUTION) ×3 IMPLANT
PACK BASIN DAY SURGERY FS (CUSTOM PROCEDURE TRAY) ×3 IMPLANT
PAD ALCOHOL SWAB (MISCELLANEOUS) ×24 IMPLANT
PAD CAST 3X4 CTTN HI CHSV (CAST SUPPLIES) ×2 IMPLANT
PADDING CAST ABS 4INX4YD NS (CAST SUPPLIES) ×2
PADDING CAST ABS COTTON 4X4 ST (CAST SUPPLIES) ×1 IMPLANT
PADDING CAST COTTON 3X4 STRL (CAST SUPPLIES) ×6
SHEET MEDIUM DRAPE 40X70 STRL (DRAPES) ×3 IMPLANT
STOCKINETTE 4X48 STRL (DRAPES) ×3 IMPLANT
SUT PROLENE 4 0 PS 2 18 (SUTURE) ×3 IMPLANT
SYR BULB 3OZ (MISCELLANEOUS) ×3 IMPLANT
SYR CONTROL 10ML LL (SYRINGE) ×6 IMPLANT
TOWEL OR 17X24 6PK STRL BLUE (TOWEL DISPOSABLE) ×3 IMPLANT
TOWEL OR NON WOVEN STRL DISP B (DISPOSABLE) ×3 IMPLANT
TRAY DSU PREP LF (CUSTOM PROCEDURE TRAY) ×3 IMPLANT
UNDERPAD 30X30 (UNDERPADS AND DIAPERS) ×3 IMPLANT

## 2015-06-20 NOTE — H&P (Signed)
Aaron Malone is an 39 y.o. male.   Chief Complaint: L CTS plan release HPI: presents for L CTR  Past Medical History  Diagnosis Date  . Major depressive disorder, single episode, moderate ([place])   . History of kidney stones   . Carpal tunnel syndrome of right wrist 07/2014    Past Surgical History  Procedure Laterality Date  . Lithotripsy  12/2003, 09/2006  . Cholecystectomy  2012  . Trigger finger release      thumb  . Inguinal hernia repair Bilateral 1983  . Carpal tunnel release Bilateral 08/24/2014    Procedure: RIGHT LIMITED OPEN CARPAL TUNNEL RELEASE, LEFT CARPAL  TUNNEL INJECTION;  Surgeon: Roseanne Kaufman, MD;  Location: [facility];  Service: Orthopedics;  Laterality: Bilateral;    Family History  Problem Relation Age of Onset  . Hyperlipidemia Mother   . Hypertension Mother   . Polycystic ovary syndrome Sister   . Cancer Maternal Grandmother     Colon and Lung  . Cancer Maternal Grandfather     Prostate  . Heart failure Maternal Grandfather   . Heart disease Maternal Grandfather   . Multiple myeloma Paternal Grandfather   . Diabetes Paternal Grandmother    Social History:  reports that he has never smoked. He has never used smokeless tobacco. He reports that he drinks alcohol. He reports that he does not use illicit drugs.  Allergies: No Known Allergies  Medications Prior to Admission  Medication Sig Dispense Refill  . desvenlafaxine (PRISTIQ) 100 MG 24 hr tablet Take 100 mg by mouth daily.    . fluticasone (FLONASE) 50 MCG/ACT nasal spray Place 2 sprays into both nostrils daily. 16 g 5  . HYDROcodone-acetaminophen (NORCO) 5-325 MG per tablet Take 2 tablets by mouth every 6 (six) hours as needed for moderate pain. 30 tablet 0  . ibuprofen (ADVIL,MOTRIN) 600 MG tablet Take 600 mg by mouth every 6 (six) hours as needed.      . indapamide (LOZOL) 1.25 MG tablet Take 1 tablet (1.25 mg total) by mouth every morning. 90 tablet 3  . Turmeric 450 MG CAPS  Take 450 mg by mouth daily.      No results found for this or any previous visit (from the past 48 hour(s)). No results found.  ROS  Blood pressure 137/87, pulse 90, temperature 98.6 F (37 C), temperature source Oral, resp. rate 18, height 5' 7" (1.702 m), weight 94.802 kg (209 lb), SpO2 99 %. Physical Exam  L CTS  The patient is alert and oriented in no acute distress. The patient complains of pain in the affected upper extremity.  The patient is noted to have a normal HEENT exam. Lung fields show equal chest expansion and no shortness of breath. Abdomen exam is nontender without distention. Lower extremity examination does not show any fracture dislocation or blood clot symptoms. Pelvis is stable and the neck and back are stable and nontender. Assessment/Plan L CTS Plan L CTR  We are planning surgery for your upper extremity. The risk and benefits of surgery to include risk of bleeding, infection, anesthesia,  damage to normal structures and failure of the surgery to accomplish its intended goals of relieving symptoms and restoring function have been discussed in detail. With this in mind we plan to proceed. I have specifically discussed with the patient the pre-and postoperative regime and the dos and don'ts and risk and benefits in great detail. Risk and benefits of surgery also include risk of dystrophy(CRPS), chronic  nerve pain, failure of the healing process to go onto completion and other inherent risks of surgery The relavent the pathophysiology of the disease/injury process, as well as the alternatives for treatment and postoperative course of action has been discussed in great detail with the patient who desires to proceed.  We will do everything in our power to help you (the patient) restore function to the upper extremity. It is a pleasure to see this patient today.  Aaron Malone,Aaron Malone M 06/20/2015, 3:00 PM

## 2015-06-20 NOTE — Discharge Instructions (Signed)

## 2015-06-20 NOTE — Transfer of Care (Signed)
Immediate Anesthesia Transfer of Care Note  Patient: Aaron Malone  Procedure(s) Performed: Procedure(s): LEFT CARPAL TUNNEL RELEASE (Left)  Patient Location: PACU  Anesthesia Type:MAC  Level of Consciousness: awake, alert  and oriented  Airway & Oxygen Therapy: Patient Spontanous Breathing and Patient connected to face mask oxygen  Post-op Assessment: Report given to RN and Post -op Vital signs reviewed and stable  Post vital signs: Reviewed and stable  Last Vitals:  Filed Vitals:   06/20/15 1319  BP: 137/87  Pulse: 90  Temp: 37 C  Resp: 18    Complications: No apparent anesthesia complications

## 2015-06-20 NOTE — Anesthesia Preprocedure Evaluation (Signed)
Anesthesia Evaluation  Patient identified by MRN, date of birth, ID band Patient awake    Reviewed: Allergy & Precautions, NPO status , Patient's Chart, lab work & pertinent test results  Airway Mallampati: I  TM Distance: >3 FB Neck ROM: Full    Dental  (+) Teeth Intact, Dental Advisory Given   Pulmonary    breath sounds clear to auscultation       Cardiovascular  Rhythm:Regular Rate:Normal     Neuro/Psych    GI/Hepatic   Endo/Other    Renal/GU      Musculoskeletal   Abdominal   Peds  Hematology   Anesthesia Other Findings   Reproductive/Obstetrics                             Anesthesia Physical Anesthesia Plan  ASA: I  Anesthesia Plan: MAC   Post-op Pain Management:    Induction: Intravenous  Airway Management Planned: Simple Face Mask  Additional Equipment:   Intra-op Plan:   Post-operative Plan:   Informed Consent: I have reviewed the patients History and Physical, chart, labs and discussed the procedure including the risks, benefits and alternatives for the proposed anesthesia with the patient or authorized representative who has indicated his/her understanding and acceptance.   Dental advisory given  Plan Discussed with: CRNA, Surgeon and Anesthesiologist  Anesthesia Plan Comments:         Anesthesia Quick Evaluation

## 2015-06-20 NOTE — Op Note (Signed)
See dictation# [id number] Amedeo Plenty MD

## 2015-06-20 NOTE — Anesthesia Postprocedure Evaluation (Signed)
  Anesthesia Post-op Note  Patient: Aaron Malone  Procedure(s) Performed: Procedure(s): LEFT CARPAL TUNNEL RELEASE (Left)  Patient Location: PACU  Anesthesia Type: MAC   Level of Consciousness: awake, alert  and oriented  Airway and Oxygen Therapy: Patient Spontanous Breathing  Post-op Pain: none  Post-op Assessment: Post-op Vital signs reviewed  Post-op Vital Signs: Reviewed  Last Vitals:  Filed Vitals:   06/20/15 1600  BP: 124/86  Pulse: 83  Temp:   Resp: 21    Complications: No apparent anesthesia complications

## 2015-06-21 ENCOUNTER — Encounter (HOSPITAL_BASED_OUTPATIENT_CLINIC_OR_DEPARTMENT_OTHER): Payer: Self-pay | Admitting: Orthopedic Surgery

## 2015-06-21 NOTE — Op Note (Signed)
NAME:  ALMIR, BOTTS                    ACCOUNT NO.:  MEDICAL RECORD NO.:  [id number]  LOCATION:                                 FACILITY:  PHYSICIAN:  Satira Anis. Amedeo Plenty, M.D.     DATE OF BIRTH:  DATE OF PROCEDURE: DATE OF DISCHARGE:                              OPERATIVE REPORT   PREOPERATIVE DIAGNOSIS:  Carpal tunnel syndrome, left upper extremity.  POSTOPERATIVE DIAGNOSIS:  Carpal tunnel syndrome, left upper extremity.  PROCEDURES: 1. Left median nerve/peripheral nerve block __________ carpal tunnel     release. 2. Left limited open carpal tunnel release.  SURGEON:  Satira Anis. Amedeo Plenty, M.D.  ASSISTANT:  None.  COMPLICATIONS:  None.  ANESTHESIA:  Peripheral nerve block with IV sedation, keeping the patient awake, alert, and oriented in the entire case.  TOURNIQUET TIME:  Less than 10 minutes.  INDICATIONS:  Pleasant 39 year old male with the above-mentioned diagnosis.  I have counseled him in regard to risks and benefits of surgery.  He desires to proceed.  He has had a successful right carpal tunnel release in the past and presents for left carpal tunnel release.  OPERATION IN DETAIL:  The patient was seen by myself and Anesthesia, taken to the operative suite, underwent smooth induction of peripheral nerve/median nerve block.  Prepped and draped in usual sterile fashion with Betadine scrub and paint.  Following this, arm was elevated, tourniquet was insufflated to 250 mmHg.  The patient had an incision 1 cm __________ made at the distal edge of the transcarpal carpal ligament.  Dissection was carried down.  Palmar fascia was incised. Distal edge of the transverse carpal ligament was identified and released under 4.0 loupe magnification.  Fat pad egressed nicely at very thickened canal and impressive wall thickness.  Following this, distal- to-proximal dissection was carried out until adequate room was available for canal __________ 1, 2, and 3 placed just under  the proximal leading leaflet of the transverse carpal ligament.  He tolerated this well. There were no complicating features.  Following this, we placed a security clip followed by the security knife into the security clip, effectively releasing the proximal leaflet.  I should note, he was awake, alert, and oriented during all points and passes and had no problems or issues.  The patient had a nice smooth release.  Following this, I deflated the tourniquet, obtained hemostasis with bipolar electrocautery.  Secured the wound with sutures thereafter and irrigated copiously prior to suture closure.  He tolerated the procedure well.  Once again, he was awake, alert, and oriented during all points and passes.  There were no complicating features and this was an uneventful left carpal tunnel release.  He will see [place] in 7 days, therapy in 12 days.  Notify us should any problems occur.     Satira Anis. Amedeo Plenty, M.D.     [facility]  D:  06/20/2015  T:  06/21/2015  Job:  [id number]

## 2015-06-21 NOTE — Addendum Note (Signed)
Addendum  created 06/21/15 0941 by Tawni Millers, CRNA   Modules edited: Charges VN

## 2015-07-09 ENCOUNTER — Telehealth: Payer: Self-pay | Admitting: Family Medicine

## 2015-07-09 DIAGNOSIS — E7800 Pure hypercholesterolemia, unspecified: Secondary | ICD-10-CM

## 2015-07-09 NOTE — Telephone Encounter (Signed)
-----   Message from Ellamae Sia sent at 07/02/2015  4:32 PM EST ----- Regarding: Lab orders for Wednesday, 11.23.16 Patient is scheduled for CPX labs, please order future labs, Thanks , Karna Christmas

## 2015-07-10 ENCOUNTER — Other Ambulatory Visit (INDEPENDENT_AMBULATORY_CARE_PROVIDER_SITE_OTHER): Payer: 59

## 2015-07-10 DIAGNOSIS — E7800 Pure hypercholesterolemia, unspecified: Secondary | ICD-10-CM | POA: Diagnosis not present

## 2015-07-10 LAB — LIPID PANEL
CHOL/HDL RATIO: 4
Cholesterol: 220 mg/dL — ABNORMAL HIGH (ref 0–200)
HDL: 50.9 mg/dL (ref >39.00)
LDL CALC: 149 mg/dL — AB (ref 0–99)
NonHDL: 168.66
Triglycerides: 99 mg/dL (ref 0.0–149.0)
VLDL: 19.8 mg/dL (ref 0.0–40.0)

## 2015-07-10 LAB — COMPREHENSIVE METABOLIC PANEL
ALBUMIN: 4.5 g/dL (ref 3.5–5.2)
ALT: 45 U/L (ref 0–53)
AST: 26 U/L (ref 0–37)
Alkaline Phosphatase: 50 U/L (ref 39–117)
BUN: 17 mg/dL (ref 6–23)
CHLORIDE: 101 meq/L (ref 96–112)
CO2: 33 meq/L — AB (ref 19–32)
CREATININE: 0.9 mg/dL (ref 0.40–1.50)
Calcium: 10.1 mg/dL (ref 8.4–10.5)
GFR: 100.85 mL/min (ref >60.00)
GLUCOSE: 90 mg/dL (ref 70–99)
POTASSIUM: 4.2 meq/L (ref 3.5–5.1)
SODIUM: 141 meq/L (ref 135–145)
Total Bilirubin: 0.4 mg/dL (ref 0.2–1.2)
Total Protein: 7.5 g/dL (ref 6.0–8.3)

## 2015-07-16 ENCOUNTER — Encounter: Payer: Self-pay | Admitting: Family Medicine

## 2015-07-16 ENCOUNTER — Ambulatory Visit (INDEPENDENT_AMBULATORY_CARE_PROVIDER_SITE_OTHER): Payer: 59 | Admitting: Family Medicine

## 2015-07-16 VITALS — BP 120/84 | HR 92 | Temp 98.1°F | Ht 66.0 in | Wt 216.8 lb

## 2015-07-16 DIAGNOSIS — E7800 Pure hypercholesterolemia, unspecified: Secondary | ICD-10-CM

## 2015-07-16 DIAGNOSIS — Z0000 Encounter for general adult medical examination without abnormal findings: Secondary | ICD-10-CM | POA: Diagnosis not present

## 2015-07-16 DIAGNOSIS — F331 Major depressive disorder, recurrent, moderate: Secondary | ICD-10-CM

## 2015-07-16 NOTE — Progress Notes (Signed)
HPI  The patient is here for annual wellness exam and preventative care.   Feeling well overall. Recent left carpal tunnel release by Dr. Amedeo Plenty in early Nov.  Elevated Cholesterol: LDL at goal <160 on no med.  Lab Results  Component Value Date   CHOL 220* 07/10/2015   HDL 50.90 07/10/2015   LDLCALC 149* 07/10/2015   LDLDIRECT 147.8 05/03/2013   TRIG 99.0 07/10/2015   CHOLHDL 4 07/10/2015  Using medications without problems: None  Diet compliance: moderate  Exercise: None  Other complaints:   Depression: followed by psychiatry now, Dr. Caprice Beaver. Moderate control on pristiq. Recent addition of  Rexulti for adjunct.  No current exercise. Poor diet. Wt Readings from Last 3 Encounters:   Wt Readings from Last 3 Encounters:  07/16/15 216 lb 12 oz (98.317 kg)  06/20/15 209 lb (94.802 kg)  08/24/14 203 lb (92.08 kg)   Review of Systems  Constitutional: Negative for fever, fatigue and unexpected weight change.  HENT: Negative for ear pain, congestion, sore throat, rhinorrhea, trouble swallowing and postnasal drip.  Eyes: Negative for pain.  Respiratory: Negative for cough, shortness of breath and wheezing.  Cardiovascular: Negative for chest pain, palpitations and leg swelling.  Gastrointestinal: Negative for nausea, abdominal pain, diarrhea, constipation and blood in stool.  Genitourinary: Negative for dysuria, urgency, hematuria, discharge, penile swelling, scrotal swelling, difficulty urinating, penile pain and testicular pain.  Skin: Negative for rash.  Neurological: Negative for syncope, weakness, light-headedness, numbness and headaches.  Psychiatric/Behavioral: Negative for behavioral problems . The patient is not nervous/anxious.  Objective:   Physical Exam  Constitutional: He appears well-developed and well-nourished. Non-toxic appearance. He does not appear ill. No distress.  HENT:  Head: Normocephalic and atraumatic.  Right Ear: Hearing, tympanic  membrane, external ear and ear canal normal.  Left Ear: Hearing, tympanic membrane, external ear and ear canal normal.  Nose: Nose normal.  Mouth/Throat: Uvula is midline, oropharynx is clear and moist and mucous membranes are normal.  Eyes: Conjunctivae normal, EOM and lids are normal. Pupils are equal, round, and reactive to light. No foreign bodies found.  Neck: Trachea normal, normal range of motion and phonation normal. Neck supple. Carotid bruit is not present. No mass and no thyromegaly present.  Cardiovascular: Normal rate, regular rhythm, S1 normal, S2 normal, intact distal pulses and normal pulses. Exam reveals no gallop.  No murmur heard.  Pulmonary/Chest: Breath sounds normal. He has no wheezes. He has no rhonchi. He has no rales.  Abdominal: Soft. Normal appearance and bowel sounds are normal. There is no hepatosplenomegaly. There is no tenderness. There is no rebound, no guarding and no CVA tenderness. No hernia. Hernia confirmed negative in the right inguinal area and confirmed negative in the left inguinal area.  Genitourinary: Not indicated. Lymphadenopathy:  He has no cervical adenopathy.  Right: No inguinal adenopathy present.  Left: No inguinal adenopathy present.  Neurological: He is alert. He has normal strength and normal reflexes. No cranial nerve deficit or sensory deficit. Gait normal.  Skin: Skin is warm, dry and intact. No rash noted.  Psychiatric: He has a normal mood and affect. His speech is normal and behavior is normal. Judgment normal.  Assessment & Plan:   The patient's preventative maintenance and recommended screening tests for an annual wellness exam were reviewed in full today.  Brought up to date unless services declined.  Counselled on the importance of diet, exercise, and its role in overall health and mortality.  The patient's FH and SH was  reviewed, including their home life, tobacco status, and drug and alcohol status.   Vaccines:  Uptodate td, flu. No family history of  prostate cancer.   Mother with mutlple polyps, MGM colon cancer. NO symptoms. Pt would like to consider referral to GI at around age 52 for consideration of need for early colonoscopy. No desire for STD screen.  No smoking.

## 2015-07-16 NOTE — Assessment & Plan Note (Signed)
Worsened control but at goal < 160.  Encouraged exercise, weight loss, healthy eating habits.

## 2015-07-16 NOTE — Assessment & Plan Note (Signed)
Moderate control on pristiq. Recent addition of rexulti by Dr. Caprice Beaver. Encouraged exercise for natural endorphin release.

## 2015-07-16 NOTE — Progress Notes (Signed)
Pre visit review using our clinic review tool, if applicable. No additional management support is needed unless otherwise documented below in the visit note. 

## 2015-07-16 NOTE — Patient Instructions (Addendum)
Get back to regular exercise and get back on track with healthy eating.

## 2015-08-21 DIAGNOSIS — F4323 Adjustment disorder with mixed anxiety and depressed mood: Secondary | ICD-10-CM | POA: Diagnosis not present

## 2015-09-04 DIAGNOSIS — F331 Major depressive disorder, recurrent, moderate: Secondary | ICD-10-CM | POA: Diagnosis not present

## 2015-09-16 ENCOUNTER — Other Ambulatory Visit: Payer: Self-pay | Admitting: Family Medicine

## 2015-09-18 DIAGNOSIS — F4323 Adjustment disorder with mixed anxiety and depressed mood: Secondary | ICD-10-CM | POA: Diagnosis not present

## 2015-09-24 ENCOUNTER — Other Ambulatory Visit: Payer: Self-pay | Admitting: Family Medicine

## 2015-09-24 NOTE — Telephone Encounter (Signed)
Last office visit 07/16/2015.  Not on current medication list.  Refill?

## 2015-09-25 DIAGNOSIS — H5213 Myopia, bilateral: Secondary | ICD-10-CM | POA: Diagnosis not present

## 2015-09-30 ENCOUNTER — Encounter: Payer: Self-pay | Admitting: Family Medicine

## 2015-09-30 ENCOUNTER — Other Ambulatory Visit: Payer: Self-pay | Admitting: Family Medicine

## 2015-10-01 MED ORDER — SCOPOLAMINE 1 MG/3DAYS TD PT72: 1.0000 | MEDICATED_PATCH | TRANSDERMAL | Status: DC

## 2015-10-01 NOTE — Telephone Encounter (Signed)
Rx sent 

## 2015-11-06 ENCOUNTER — Ambulatory Visit: Payer: Self-pay | Admitting: Physician Assistant

## 2015-11-06 ENCOUNTER — Encounter: Payer: Self-pay | Admitting: Physician Assistant

## 2015-11-06 VITALS — BP 139/82 | HR 80 | Temp 97.7°F

## 2015-11-06 DIAGNOSIS — R21 Rash and other nonspecific skin eruption: Secondary | ICD-10-CM

## 2015-11-06 DIAGNOSIS — F4323 Adjustment disorder with mixed anxiety and depressed mood: Secondary | ICD-10-CM | POA: Diagnosis not present

## 2015-11-06 NOTE — Progress Notes (Signed)
S: c/o rash on r hand, been using lamisil and other antifungal cream, not responding, ? If should go on another type of cream  O: vitals wnl, nad, skin on right hand with round lesion size of 50 cent piece, raised edge/border, some flaking in center, no drainage, n/v intact  A: rash  P: use lamisil consistently, offered oral antifungal but pt doesn't really like this idea, told him if it doesn't get any better in a week will call in a rx strength, ideally I would like for the patient to see dermatology to ensure its fungal

## 2015-11-18 ENCOUNTER — Encounter: Payer: Self-pay | Admitting: Physician Assistant

## 2015-11-18 ENCOUNTER — Encounter: Payer: Self-pay | Admitting: Family Medicine

## 2015-11-22 ENCOUNTER — Ambulatory Visit (INDEPENDENT_AMBULATORY_CARE_PROVIDER_SITE_OTHER): Payer: 59 | Admitting: Family Medicine

## 2015-11-22 ENCOUNTER — Encounter: Payer: Self-pay | Admitting: Family Medicine

## 2015-11-22 VITALS — BP 110/74 | HR 88 | Temp 97.9°F | Ht 66.0 in | Wt 215.5 lb

## 2015-11-22 DIAGNOSIS — L989 Disorder of the skin and subcutaneous tissue, unspecified: Secondary | ICD-10-CM | POA: Insufficient documentation

## 2015-11-22 DIAGNOSIS — R21 Rash and other nonspecific skin eruption: Secondary | ICD-10-CM | POA: Insufficient documentation

## 2015-11-22 LAB — POCT SKIN KOH: Skin KOH, POC: NEGATIVE

## 2015-11-22 MED ORDER — BETAMETHASONE DIPROPIONATE 0.05 % EX CREA: TOPICAL_CREAM | Freq: Two times a day (BID) | CUTANEOUS | Status: DC

## 2015-11-22 MED ORDER — ONDANSETRON HCL 4 MG PO TABS: 4.0000 mg | ORAL_TABLET | Freq: Three times a day (TID) | ORAL | Status: DC | PRN

## 2015-11-22 NOTE — Assessment & Plan Note (Signed)
No response to antifungal and neg KOH.  More likely atopic dermatitis. Will treat with steroid cream. If not improving consider psoriasis.

## 2015-11-22 NOTE — Progress Notes (Addendum)
   Subjective:    Patient ID: Aaron Malone, male    DOB: 05/31/1976, 40 y.o.   MRN: [id number]  HPI   40 year old male presents with new onset rash on right hand.  Noted first in mid feb. Treated it with hydrocortisone cream 1% x 2 days but then worried this may be ringworm. .   Then tried lamisil for a week, had a little irritation from this.  Then changed to clotrimazole for 3-4 weeks, then tried lamisil three times  A day.. No improvement.  Now new papular red rash around circular lesion appeared in last 4 days. Not itchy.  Washes hands a lot. No new exposures.  No other symptoms, no other rash. No sick contacts for rash.  Son has GI illness.Marland Kitchen He is worried that he may Get viral GE, request few zofran.     Social History /Family History/Past Medical History reviewed and updated if needed.     Review of Systems  Constitutional: Negative for fever and fatigue.  HENT: Negative for ear pain.   Eyes: Negative for pain.  Respiratory: Negative for cough.   Cardiovascular: Negative for chest pain.       Objective:   Physical Exam  Constitutional: Vital signs are normal. He appears well-developed and well-nourished.  HENT:  Head: Normocephalic.  Right Ear: Hearing normal.  Left Ear: Hearing normal.  Nose: Nose normal.  Mouth/Throat: Oropharynx is clear and moist and mucous membranes are normal.  Neck: Trachea normal. Carotid bruit is not present. No thyroid mass and no thyromegaly present.  Cardiovascular: Normal rate, regular rhythm and normal pulses.  Exam reveals no gallop, no distant heart sounds and no friction rub.   No murmur heard. No peripheral edema  Pulmonary/Chest: Effort normal and breath sounds normal. No respiratory distress.  Skin: Skin is warm, dry and intact. No rash noted.  Well circumscribes round plaque, thickened skin, dry, flaky, slight erythematous border, 4 cm diameter. Surrounding papular, erythematous rash.  Psychiatric: He has a normal  mood and affect. His speech is normal and behavior is normal. Thought content normal.          Assessment & Plan:

## 2015-11-22 NOTE — Progress Notes (Signed)
Pre visit review using our clinic review tool, if applicable. No additional management support is needed unless otherwise documented below in the visit note. 

## 2015-11-22 NOTE — Addendum Note (Signed)
Addended by: Eliezer Lofts E on: 11/22/2015 08:50 AM   Modules accepted: Orders

## 2015-11-22 NOTE — Patient Instructions (Signed)
Apply steroid cream twice daily x 2 weeks. Call if not improvement.

## 2015-12-11 DIAGNOSIS — F4323 Adjustment disorder with mixed anxiety and depressed mood: Secondary | ICD-10-CM | POA: Diagnosis not present

## 2016-01-01 DIAGNOSIS — Z0000 Encounter for general adult medical examination without abnormal findings: Secondary | ICD-10-CM | POA: Diagnosis not present

## 2016-01-01 DIAGNOSIS — N202 Calculus of kidney with calculus of ureter: Secondary | ICD-10-CM | POA: Diagnosis not present

## 2016-01-01 DIAGNOSIS — F4323 Adjustment disorder with mixed anxiety and depressed mood: Secondary | ICD-10-CM | POA: Diagnosis not present

## 2016-01-01 DIAGNOSIS — M549 Dorsalgia, unspecified: Secondary | ICD-10-CM | POA: Diagnosis not present

## 2016-01-01 DIAGNOSIS — N281 Cyst of kidney, acquired: Secondary | ICD-10-CM | POA: Diagnosis not present

## 2016-01-01 DIAGNOSIS — N201 Calculus of ureter: Secondary | ICD-10-CM | POA: Diagnosis not present

## 2016-03-18 DIAGNOSIS — F331 Major depressive disorder, recurrent, moderate: Secondary | ICD-10-CM | POA: Diagnosis not present

## 2016-04-29 DIAGNOSIS — F331 Major depressive disorder, recurrent, moderate: Secondary | ICD-10-CM | POA: Diagnosis not present

## 2016-06-02 ENCOUNTER — Other Ambulatory Visit: Payer: Self-pay | Admitting: Family Medicine

## 2016-06-24 DIAGNOSIS — F331 Major depressive disorder, recurrent, moderate: Secondary | ICD-10-CM | POA: Diagnosis not present

## 2016-07-07 ENCOUNTER — Encounter: Payer: Self-pay | Admitting: Family Medicine

## 2016-07-14 NOTE — Telephone Encounter (Signed)
See pt Mychart messages. Have you seen this report from dentist about sleep testing? Can you re-request from dentist?

## 2016-07-14 NOTE — Telephone Encounter (Signed)
Left message with Caryl Pina at Dr. Corky Sing office requesting Sleep Study results be faxed to [phone].

## 2016-07-14 NOTE — Telephone Encounter (Signed)
Report in Dr. Rometta Emery in box to review.

## 2016-07-16 ENCOUNTER — Other Ambulatory Visit: Payer: Self-pay | Admitting: Family Medicine

## 2016-07-16 DIAGNOSIS — G4733 Obstructive sleep apnea (adult) (pediatric): Secondary | ICD-10-CM

## 2016-07-16 NOTE — Progress Notes (Signed)
amb  

## 2016-07-21 ENCOUNTER — Telehealth: Payer: Self-pay | Admitting: Family Medicine

## 2016-07-21 DIAGNOSIS — E7800 Pure hypercholesterolemia, unspecified: Secondary | ICD-10-CM

## 2016-07-21 NOTE — Telephone Encounter (Signed)
-----   Message from Marchia Bond sent at 07/15/2016  1:10 PM EST ----- Regarding: Cpx labs Wed 12/6, need orders. Thanks:-) Please order  future cpx labs for pt's upcoming lab appt. Thanks Aniceto Boss

## 2016-07-22 ENCOUNTER — Other Ambulatory Visit (INDEPENDENT_AMBULATORY_CARE_PROVIDER_SITE_OTHER): Payer: 59

## 2016-07-22 DIAGNOSIS — E7800 Pure hypercholesterolemia, unspecified: Secondary | ICD-10-CM

## 2016-07-22 LAB — COMPREHENSIVE METABOLIC PANEL
ALK PHOS: 58 U/L (ref 39–117)
ALT: 37 U/L (ref 0–53)
AST: 23 U/L (ref 0–37)
Albumin: 4.8 g/dL (ref 3.5–5.2)
BILIRUBIN TOTAL: 0.4 mg/dL (ref 0.2–1.2)
BUN: 20 mg/dL (ref 6–23)
CO2: 34 mEq/L — ABNORMAL HIGH (ref 19–32)
CREATININE: 0.97 mg/dL (ref 0.40–1.50)
Calcium: 9.8 mg/dL (ref 8.4–10.5)
Chloride: 96 mEq/L (ref 96–112)
GFR: 91.98 mL/min (ref >60.00)
GLUCOSE: 106 mg/dL — AB (ref 70–99)
Potassium: 3.7 mEq/L (ref 3.5–5.1)
SODIUM: 138 meq/L (ref 135–145)
TOTAL PROTEIN: 7.8 g/dL (ref 6.0–8.3)

## 2016-07-22 LAB — LIPID PANEL
Cholesterol: 183 mg/dL (ref 0–200)
HDL: 40.9 mg/dL (ref >39.00)
LDL Cholesterol: 124 mg/dL — ABNORMAL HIGH (ref 0–99)
NONHDL: 142.54
Total CHOL/HDL Ratio: 4
Triglycerides: 94 mg/dL (ref 0.0–149.0)
VLDL: 18.8 mg/dL (ref 0.0–40.0)

## 2016-07-27 ENCOUNTER — Encounter: Payer: Self-pay | Admitting: Internal Medicine

## 2016-07-27 ENCOUNTER — Ambulatory Visit (INDEPENDENT_AMBULATORY_CARE_PROVIDER_SITE_OTHER): Payer: 59 | Admitting: Internal Medicine

## 2016-07-27 VITALS — BP 112/64 | HR 83 | Ht 66.0 in | Wt 209.6 lb

## 2016-07-27 DIAGNOSIS — G4733 Obstructive sleep apnea (adult) (pediatric): Secondary | ICD-10-CM | POA: Diagnosis not present

## 2016-07-27 NOTE — Progress Notes (Signed)
07/27/2016-40 year old male never smoker-referred courtesy of Dr Diona Browner  for snoring at night. Dentist recommended a take home sleep study.  Unattended Home Sleep Test ([facility]) 06/23/16  AHI 23.4/ hr, desaturation to 78%, body weight 215 lbs Medical history of allergic rhinitis, depression Snoring and witnessed apneas. Daytime tiredness. Symptoms not much changed after losing 20 pounds recently. Usual bedtime between 10 and 11 PM with sleep latency 20 minutes waking once before up at 5:30 AM. Mother loud snorer No ENT surgery, heart or lung disease known. Epworth score 7/24 Unattended home sleep test 06/24/2016 recorded AHI 23.4/hour with desaturation to 78% Brother-in-law who uses CPAP and is very happy with it. His dentist, Dr. Delia Chimes, can provide a fitted oral appliance.  Prior to Admission medications   Medication Sig Start Date End Date Taking? Authorizing Provider  betamethasone dipropionate (DIPROLENE) 0.05 % cream Apply topically 2 (two) times daily. 11/22/15  Yes Amy E Diona Browner, MD  desvenlafaxine (PRISTIQ) 100 MG 24 hr tablet Take 100 mg by mouth daily. Reported on 11/06/2015   Yes Historical Provider, MD  fluticasone (FLONASE) 50 MCG/ACT nasal spray PLACE 2 SPRAYS INTO BOTH NOSTRILS DAILY. 09/30/15  Yes Amy Cletis [place], MD  ibuprofen (ADVIL,MOTRIN) 600 MG tablet Take 600 mg by mouth every 6 (six) hours as needed.     Yes Historical Provider, MD  indapamide (LOZOL) 1.25 MG tablet TAKE 1 TABLET BY MOUTH EVERY MORNING. 06/02/16  Yes Amy E Bedsole, MD  ipratropium (ATROVENT) 0.03 % nasal spray PLACE 2 SPRAYS INTO BOTH NOSTRILS EVERY 12 HOURS. 09/24/15  Yes Amy E Bedsole, MD  ondansetron (ZOFRAN) 4 MG tablet Take 1 tablet (4 mg total) by mouth every 8 (eight) hours as needed for nausea or vomiting. 11/22/15  Yes Amy Cletis [place], MD  propranolol (INDERAL) 10 MG tablet Take 10 mg by mouth daily as needed.  05/24/15  Yes Historical Provider, MD  Turmeric 450 MG CAPS Take 450 mg by mouth daily.    Yes Historical Provider, MD   Past Medical History:  Diagnosis Date  . Carpal tunnel syndrome of right wrist 07/2014  . History of kidney stones   . Major depressive disorder, single episode, moderate ([place])    Past Surgical History:  Procedure Laterality Date  . CARPAL TUNNEL RELEASE Bilateral 08/24/2014   Procedure: RIGHT LIMITED OPEN CARPAL TUNNEL RELEASE, LEFT CARPAL  TUNNEL INJECTION;  Surgeon: Roseanne Kaufman, MD;  Location: [facility];  Service: Orthopedics;  Laterality: Bilateral;  . CARPAL TUNNEL RELEASE Left 06/20/2015   Procedure: LEFT CARPAL TUNNEL RELEASE;  Surgeon: Roseanne Kaufman, MD;  Location: [facility];  Service: Orthopedics;  Laterality: Left;  . CHOLECYSTECTOMY  2012  . INGUINAL HERNIA REPAIR Bilateral 1983  . LITHOTRIPSY  12/2003, 09/2006  . TRIGGER FINGER RELEASE     thumb   Family History  Problem Relation Age of Onset  . Hyperlipidemia Mother   . Hypertension Mother   . Polycystic ovary syndrome Sister   . Cancer Maternal Grandmother     Colon and Lung  . Cancer Maternal Grandfather     Prostate  . Heart failure Maternal Grandfather   . Heart disease Maternal Grandfather   . Multiple myeloma Paternal Grandfather   . Diabetes Paternal Grandmother    Social History   Social History  . Marital status: Married    Spouse name: N/A  . Number of children: N/A  . Years of education: N/A   Occupational History  . 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 Smoking status: Never Smoker  . Smokeless tobacco: Never Used  . Alcohol use Yes     Comment: occasionally  . Drug use: No  . Sexual activity: Not on file   Other Topics Concern  . Not on file   Social History Narrative   Gaspar Bidding is married and lives with wife and child. Currently works a Software engineer at [employer].    ROS-see HPI   Negative unless "+" Constitutional:   + weight loss, night sweats, fevers, chills,  +fatigue, lassitude. HEENT:    +headaches, difficulty swallowing, tooth/dental problems, sore throat,       sneezing, itching, ear ache, nasal congestion, post nasal drip, snoring CV:    chest pain, orthopnea, PND, swelling in lower extremities, anasarca,                                                        dizziness, palpitations Resp:   shortness of breath with exertion or at rest.                productive cough,   non-productive cough, coughing up of blood.              change in color of mucus.  wheezing.   Skin:    rash or lesions. GI:  No-   heartburn, indigestion, abdominal pain, nausea, vomiting, diarrhea,                 change in bowel habits, loss of appetite GU: dysuria, change in color of urine, no urgency or frequency.   flank pain. MS:   joint pain, stiffness, decreased range of motion, back pain. Neuro-     nothing unusual Psych:  change in mood or affect.  +depression or anxiety.   memory loss.  OBJ- Physical Exam General- Alert, Oriented, Affect-appropriate, Distress- none acute, + overweight Skin- rash-none, lesions- none, excoriation- none Lymphadenopathy- none Head- atraumatic            Eyes- Gross vision intact, PERRLA, conjunctivae and secretions clear            Ears- Hearing, canals-normal            Nose- Clear, no-Septal dev, mucus, polyps, erosion, perforation             Throat- Mallampati II-III , mucosa clear , drainage- none, tonsils- atrophic Neck- flexible , trachea midline, no stridor , thyroid nl, carotid no bruit Chest - symmetrical excursion , unlabored           Heart/CV- RRR , no murmur , no gallop  , no rub, nl s1 s2                           - JVD- none , edema- none, stasis changes- none, varices- none           Lung- clear to P&A, wheeze- none, cough- none , dullness-none, rub- none           Chest wall-  Abd-  Br/ Gen/ Rectal- Not done, not indicated Extrem- cyanosis- none, clubbing, none, atrophy- none, strength- nl Neuro- grossly  intact to observation

## 2016-07-27 NOTE — Patient Instructions (Signed)
Order- new DME new CPAP auto 5- 20, mask of choice, humidifier, supplies, AirView,     Dx OSA  We will get you back to follow up within about 3 months - please call if you need [place].

## 2016-07-29 ENCOUNTER — Telehealth: Payer: Self-pay | Admitting: Internal Medicine

## 2016-07-29 ENCOUNTER — Encounter: Payer: Self-pay | Admitting: Internal Medicine

## 2016-07-29 DIAGNOSIS — G4733 Obstructive sleep apnea (adult) (pediatric): Secondary | ICD-10-CM

## 2016-07-29 NOTE — Telephone Encounter (Signed)
atc [facility], received after hours line.  wcb tomorrow.

## 2016-07-29 NOTE — Assessment & Plan Note (Signed)
Moderate obstructive sleep apnea/hypopnea syndrome based on unattended home sleep test as documented. This is consistent with his history and physical exam. We discussed treatment options. He understands the importance of weight management and his responsibility to drive safely. We discussed the basics of sleep hygiene and the medical issues of untreated sleep apnea. He is choosing to start with CPAP. Plan-start with CPAP auto titration

## 2016-07-29 NOTE — Telephone Encounter (Signed)
See telephone note for 07/29/16 -- our office attempted to call [facility] back. Will close this encounter as it is being handled in the telephone note.  [facility] Estill Bamberg)  to [employer]     07/29/16 3:46 PM  calling a/b a sleep study pt had and is needing a new one due to some office notes not sent and they acre counting this as incomplete needing face54face

## 2016-07-30 NOTE — Telephone Encounter (Signed)
[facility] for our office spoke with [facility] and patient. Pt is aware that we would need to do another HST to get set up with CPAP. I have placed order for HST and Rodena Piety will get patient scheduled and test completed before the end of the year.

## 2016-07-31 ENCOUNTER — Encounter: Payer: Self-pay | Admitting: Family Medicine

## 2016-07-31 ENCOUNTER — Ambulatory Visit (INDEPENDENT_AMBULATORY_CARE_PROVIDER_SITE_OTHER): Payer: 59 | Admitting: Family Medicine

## 2016-07-31 ENCOUNTER — Encounter: Payer: Self-pay | Admitting: *Deleted

## 2016-07-31 VITALS — BP 106/70 | HR 87 | Temp 97.7°F | Ht 65.5 in | Wt 208.5 lb

## 2016-07-31 DIAGNOSIS — G4733 Obstructive sleep apnea (adult) (pediatric): Secondary | ICD-10-CM | POA: Diagnosis not present

## 2016-07-31 DIAGNOSIS — Z0000 Encounter for general adult medical examination without abnormal findings: Secondary | ICD-10-CM | POA: Diagnosis not present

## 2016-07-31 DIAGNOSIS — E7800 Pure hypercholesterolemia, unspecified: Secondary | ICD-10-CM

## 2016-07-31 DIAGNOSIS — N200 Calculus of kidney: Secondary | ICD-10-CM

## 2016-07-31 DIAGNOSIS — F331 Major depressive disorder, recurrent, moderate: Secondary | ICD-10-CM

## 2016-07-31 DIAGNOSIS — F418 Other specified anxiety disorders: Secondary | ICD-10-CM

## 2016-07-31 NOTE — Assessment & Plan Note (Signed)
Well controlled with propranolol prn.

## 2016-07-31 NOTE — Progress Notes (Signed)
Subjective:    Patient ID: Aaron Malone, male    DOB: 03/25/1976, 40 y.o.   MRN: [id number]  HPI  The patient is here for annual wellness exam and preventative care.     Had recent test for sleep apnea.. Plan repeat sleep test. Has seen pulmonology.  Elevated Cholesterol:  Improved control with lifestyle change. Lab Results  Component Value Date   CHOL 183 07/22/2016   HDL 40.90 07/22/2016   LDLCALC 124 (H) 07/22/2016   LDLDIRECT 147.8 05/03/2013   TRIG 94.0 07/22/2016   CHOLHDL 4 07/22/2016  Using medications without problems: Muscle aches:  Diet compliance: healthy eating. Exercise: Doing couch to 5 K program. Other complaints:  Depression, well controlled: Followed by psychiatry but now office closing.. Requests management here . On pristiq. Sleeps okay from mood aspect.  No SI., no HI.  Uses propranolol for situational anxiety.  Wt Readings from Last 3 Encounters:  07/31/16 208 lb 8 oz (94.6 kg)  07/27/16 209 lb 9.6 oz (95.1 kg)  11/22/15 215 lb 8 oz (97.8 kg)  Body mass index is 34.17 kg/m.   Social History /Family History/Past Medical History reviewed and updated if needed.   Review of Systems  Constitutional: Negative for fatigue and fever.  HENT: Negative for ear pain.   Eyes: Negative for pain.  Respiratory: Negative for cough and shortness of breath.   Cardiovascular: Negative for chest pain, palpitations and leg swelling.  Gastrointestinal: Negative for abdominal pain.  Genitourinary: Negative for dysuria.  Musculoskeletal: Negative for arthralgias.  Neurological: Negative for syncope, light-headedness and headaches.  Psychiatric/Behavioral: Negative for dysphoric mood.       Objective:   Physical Exam  Constitutional: He appears well-developed and well-nourished.  Non-toxic appearance. He does not appear ill. No distress.  HENT:  Head: Normocephalic and atraumatic.  Right Ear: Hearing, tympanic membrane, external ear and ear canal normal.    Left Ear: Hearing, tympanic membrane, external ear and ear canal normal.  Nose: Nose normal.  Mouth/Throat: Uvula is midline, oropharynx is clear and moist and mucous membranes are normal.  Eyes: Conjunctivae, EOM and lids are normal. Pupils are equal, round, and reactive to light. Lids are everted and swept, no foreign bodies found.  Neck: Trachea normal, normal range of motion and phonation normal. Neck supple. Carotid bruit is not present. No thyroid mass and no thyromegaly present.  Cardiovascular: Normal rate, regular rhythm, S1 normal, S2 normal, intact distal pulses and normal pulses.  Exam reveals no gallop.   No murmur heard. Pulmonary/Chest: Breath sounds normal. He has no wheezes. He has no rhonchi. He has no rales.  Abdominal: Soft. Normal appearance and bowel sounds are normal. There is no hepatosplenomegaly. There is no tenderness. There is no rebound, no guarding and no CVA tenderness. No hernia.  Lymphadenopathy:    He has no cervical adenopathy.  Neurological: He is alert. He has normal strength and normal reflexes. No cranial nerve deficit or sensory deficit. Gait normal.  Skin: Skin is warm, dry and intact. No rash noted.  Psychiatric: He has a normal mood and affect. His speech is normal and behavior is normal. Judgment normal.          Assessment & Plan:  The patient's preventative maintenance and recommended screening tests for an annual wellness exam were reviewed in full today. Brought up to date unless services declined.  Counselled on the importance of diet, exercise, and its role in overall health and mortality. The patient's FH and [facility]  was reviewed, including their home life, tobacco status, and drug and alcohol status.   Vaccines: uptodate Td and flu No family history of  prostate cancer.    Mother with mutlple polyps, MGM colon cancer. No symptoms. Pt would like to consider referral to GI at around age 71 for consideration of need for early colonoscopy. No  desire for STD screen.  No smoking.

## 2016-07-31 NOTE — Patient Instructions (Signed)
Continue working on healthy eating, weight loss and regular exercise.

## 2016-07-31 NOTE — Assessment & Plan Note (Signed)
Pending sleep test with pulm for eval/treat.

## 2016-07-31 NOTE — Assessment & Plan Note (Signed)
Recent passage of stone without issu. Followed by Urology. On indapamide for stone prevention.

## 2016-07-31 NOTE — Assessment & Plan Note (Signed)
Improved with lifestyle changes and weight loss.

## 2016-07-31 NOTE — Assessment & Plan Note (Signed)
Stable on pristiq. Continue. Refill as needed.

## 2016-07-31 NOTE — Progress Notes (Signed)
Pre visit review using our clinic review tool, if applicable. No additional management support is needed unless otherwise documented below in the visit note. 

## 2016-08-05 DIAGNOSIS — G4733 Obstructive sleep apnea (adult) (pediatric): Secondary | ICD-10-CM | POA: Diagnosis not present

## 2016-08-11 ENCOUNTER — Other Ambulatory Visit: Payer: Self-pay | Admitting: Internal Medicine

## 2016-08-11 DIAGNOSIS — G4733 Obstructive sleep apnea (adult) (pediatric): Secondary | ICD-10-CM

## 2016-08-12 DIAGNOSIS — G4733 Obstructive sleep apnea (adult) (pediatric): Secondary | ICD-10-CM | POA: Diagnosis not present

## 2016-08-14 ENCOUNTER — Telehealth: Payer: Self-pay | Admitting: Internal Medicine

## 2016-08-14 DIAGNOSIS — G4733 Obstructive sleep apnea (adult) (pediatric): Secondary | ICD-10-CM

## 2016-08-14 NOTE — Telephone Encounter (Signed)
Rx has been printed, signed by CY, and given to Rodena Piety to get to patient.

## 2016-08-26 DIAGNOSIS — G4733 Obstructive sleep apnea (adult) (pediatric): Secondary | ICD-10-CM | POA: Diagnosis not present

## 2016-08-27 ENCOUNTER — Other Ambulatory Visit: Payer: Self-pay | Admitting: Family Medicine

## 2016-09-04 ENCOUNTER — Other Ambulatory Visit: Payer: Self-pay | Admitting: *Deleted

## 2016-09-04 DIAGNOSIS — G4733 Obstructive sleep apnea (adult) (pediatric): Secondary | ICD-10-CM

## 2016-09-21 DIAGNOSIS — G4733 Obstructive sleep apnea (adult) (pediatric): Secondary | ICD-10-CM | POA: Diagnosis not present

## 2016-09-30 DIAGNOSIS — H5213 Myopia, bilateral: Secondary | ICD-10-CM | POA: Diagnosis not present

## 2016-10-23 DIAGNOSIS — G4733 Obstructive sleep apnea (adult) (pediatric): Secondary | ICD-10-CM | POA: Diagnosis not present

## 2016-10-26 ENCOUNTER — Encounter: Payer: Self-pay | Admitting: Internal Medicine

## 2016-10-28 ENCOUNTER — Ambulatory Visit (INDEPENDENT_AMBULATORY_CARE_PROVIDER_SITE_OTHER): Payer: 59 | Admitting: Internal Medicine

## 2016-10-28 ENCOUNTER — Encounter: Payer: Self-pay | Admitting: Internal Medicine

## 2016-10-28 VITALS — BP 122/78 | HR 75 | Ht 66.0 in | Wt 216.6 lb

## 2016-10-28 DIAGNOSIS — J3089 Other allergic rhinitis: Secondary | ICD-10-CM

## 2016-10-28 DIAGNOSIS — G4733 Obstructive sleep apnea (adult) (pediatric): Secondary | ICD-10-CM

## 2016-10-28 DIAGNOSIS — J302 Other seasonal allergic rhinitis: Secondary | ICD-10-CM | POA: Diagnosis not present

## 2016-10-28 NOTE — Assessment & Plan Note (Signed)
Download confirms excellent compliance and control. He is quite comfortable with AutoPap 5-20 and with his current nasal mask. We again reviewed treatment options including oral appliance originally considered because his dentist made the referral. He wants to stick with CPAP for now.

## 2016-10-28 NOTE — Progress Notes (Signed)
07/27/2016-41 year old male never smoker-referred courtesy of Dr Diona Browner  for snoring at night. Dentist recommended a take home sleep study.  Unattended Home Sleep Test ([facility]) 06/23/16  AHI 23.4/ hr, desaturation to 78%, body weight 215 lbs Medical history of allergic rhinitis, depression Snoring and witnessed apneas. Daytime tiredness. Symptoms not much changed after losing 20 pounds recently. Usual bedtime between 10 and 11 PM with sleep latency 20 minutes waking once before up at 5:30 AM. Mother loud snorer No ENT surgery, heart or lung disease known. Epworth score 7/24 Unattended home sleep test 06/24/2016 recorded AHI 23.4/hour with desaturation to 78% Brother-in-law who uses CPAP and is very happy with it. His dentist, Dr. Delia Chimes, can provide a fitted oral appliance.  10/28/2016-41 year old male never smoker followed for OSA, complicated by allergic rhinitis FOLLOWS FOR: DME: [facility] Pt wears CPAP nightly for at least 6 hours. DL attached.  CPAP auto 5-20/[facility] He describes very good start with CPAP. Has settled on a nasal mask, is not snoring, and recognizes he is sleeping better. Download confirms with 100% 4 hour compliance, AHI 0.2/hour.  ROS-see HPI   Negative unless "+" Constitutional:   + weight loss, night sweats, fevers, chills, fatigue, lassitude. HEENT:    +headaches, difficulty swallowing, tooth/dental problems, sore throat,       sneezing, itching, ear ache, nasal congestion, post nasal drip, snoring CV:    chest pain, orthopnea, PND, swelling in lower extremities, anasarca,                                                        dizziness, palpitations Resp:   shortness of breath with exertion or at rest.                productive cough,   non-productive cough, coughing up of blood.              change in color of mucus.  wheezing.   Skin:    rash or lesions. GI:  No-   heartburn, indigestion, abdominal pain, nausea, vomiting, diarrhea,                 change  in bowel habits, loss of appetite GU: dysuria, change in color of urine, no urgency or frequency.   flank pain. MS:   joint pain, stiffness, decreased range of motion, back pain. Neuro-     nothing unusual Psych:  change in mood or affect.  +depression or anxiety.   memory loss.  OBJ- Physical Exam General- Alert, Oriented, Affect-appropriate, Distress- none acute, + overweight Skin- rash-none, lesions- none, excoriation- none Lymphadenopathy- none Head- atraumatic            Eyes- Gross vision intact, PERRLA, conjunctivae and secretions clear            Ears- Hearing, canals-normal            Nose- Clear, no-Septal dev, mucus, polyps, erosion, perforation             Throat- Mallampati III , mucosa clear , drainage- none, tonsils- atrophic Neck- flexible , trachea midline, no stridor , thyroid nl, carotid no bruit Chest - symmetrical excursion , unlabored           Heart/CV- RRR , no murmur , no gallop  , no rub, nl s1 s2                           -  JVD- none , edema- none, stasis changes- none, varices- none           Lung- clear to P&A, wheeze- none, cough- none , dullness-none, rub- none           Chest wall-  Abd-  Br/ Gen/ Rectal- Not done, not indicated Extrem- cyanosis- none, clubbing, none, atrophy- none, strength- nl Neuro- grossly intact to observation

## 2016-10-28 NOTE — Patient Instructions (Signed)
We can continue DME [facility], CPAP auto 5-20, mask of choice, humidifier, supplies, AirView   Dx OSA  Please call as  needed

## 2016-10-28 NOTE — Assessment & Plan Note (Signed)
Nasal airway currently is clear with no early spring season pollen rhinitis developing at this time.

## 2016-12-24 ENCOUNTER — Other Ambulatory Visit: Payer: Self-pay | Admitting: Family Medicine

## 2017-01-05 DIAGNOSIS — G4733 Obstructive sleep apnea (adult) (pediatric): Secondary | ICD-10-CM | POA: Diagnosis not present

## 2017-02-09 DIAGNOSIS — G4733 Obstructive sleep apnea (adult) (pediatric): Secondary | ICD-10-CM | POA: Diagnosis not present

## 2017-03-11 DIAGNOSIS — G4733 Obstructive sleep apnea (adult) (pediatric): Secondary | ICD-10-CM | POA: Diagnosis not present

## 2017-04-02 ENCOUNTER — Other Ambulatory Visit: Payer: Self-pay | Admitting: Family Medicine

## 2017-04-02 ENCOUNTER — Encounter: Payer: Self-pay | Admitting: Family Medicine

## 2017-04-02 MED ORDER — DESVENLAFAXINE SUCCINATE ER 100 MG PO TB24: 100.0000 mg | ORAL_TABLET | Freq: Every day | ORAL | 3 refills | Status: DC

## 2017-04-02 NOTE — Telephone Encounter (Signed)
Please see phone note I placed. I received a paper refill request for this same medication

## 2017-04-02 NOTE — Telephone Encounter (Signed)
Please advise on refill.  Received a paper refill request for this pt's desvenlafaxine suc er 100. Looks like this pt was getting filled through Dr. Sheralyn Boatman. Pt usually gets a qty of 90 with 1RF. According to pharmacy pt picked up last refill on 6/27. His last ov was 07/31/16. Were you taking over refills for this pt?

## 2017-04-02 NOTE — Telephone Encounter (Signed)
Yes.. Okay to refill x 6 months

## 2017-04-02 NOTE — Telephone Encounter (Signed)
Rx sent and Mychart message has been sent to pt. Nothing additional is needed

## 2017-04-12 DIAGNOSIS — G4733 Obstructive sleep apnea (adult) (pediatric): Secondary | ICD-10-CM | POA: Diagnosis not present

## 2017-05-19 DIAGNOSIS — M25571 Pain in right ankle and joints of right foot: Secondary | ICD-10-CM | POA: Diagnosis not present

## 2017-05-19 DIAGNOSIS — M7662 Achilles tendinitis, left leg: Secondary | ICD-10-CM | POA: Diagnosis not present

## 2017-05-19 DIAGNOSIS — M7732 Calcaneal spur, left foot: Secondary | ICD-10-CM | POA: Diagnosis not present

## 2017-07-12 DIAGNOSIS — G4733 Obstructive sleep apnea (adult) (pediatric): Secondary | ICD-10-CM | POA: Diagnosis not present

## 2017-07-23 ENCOUNTER — Other Ambulatory Visit: Payer: Self-pay | Admitting: Family Medicine

## 2017-07-23 ENCOUNTER — Encounter: Payer: Self-pay | Admitting: Family Medicine

## 2017-07-28 ENCOUNTER — Other Ambulatory Visit: Payer: Self-pay

## 2017-07-28 ENCOUNTER — Telehealth: Payer: Self-pay | Admitting: Family Medicine

## 2017-07-28 ENCOUNTER — Encounter: Payer: Self-pay | Admitting: Family Medicine

## 2017-07-28 DIAGNOSIS — E7800 Pure hypercholesterolemia, unspecified: Secondary | ICD-10-CM

## 2017-07-28 DIAGNOSIS — R5383 Other fatigue: Secondary | ICD-10-CM

## 2017-07-28 NOTE — Telephone Encounter (Signed)
-----   Message from Ellamae Sia sent at 07/22/2017  9:22 AM EST ----- Regarding: Lab orders for Wednesday, 12.12.18 Patient is scheduled for CPX labs, please order future labs, Thanks , Karna Christmas

## 2017-07-30 ENCOUNTER — Other Ambulatory Visit (INDEPENDENT_AMBULATORY_CARE_PROVIDER_SITE_OTHER): Payer: Self-pay

## 2017-07-30 ENCOUNTER — Telehealth: Payer: Self-pay | Admitting: Family Medicine

## 2017-07-30 DIAGNOSIS — R5383 Other fatigue: Secondary | ICD-10-CM | POA: Diagnosis not present

## 2017-07-30 DIAGNOSIS — E7800 Pure hypercholesterolemia, unspecified: Secondary | ICD-10-CM

## 2017-07-30 LAB — COMPREHENSIVE METABOLIC PANEL
ALT: 50 U/L (ref 0–53)
AST: 24 U/L (ref 0–37)
Albumin: 4.6 g/dL (ref 3.5–5.2)
Alkaline Phosphatase: 42 U/L (ref 39–117)
BILIRUBIN TOTAL: 0.6 mg/dL (ref 0.2–1.2)
BUN: 20 mg/dL (ref 6–23)
CALCIUM: 9.4 mg/dL (ref 8.4–10.5)
CHLORIDE: 100 meq/L (ref 96–112)
CO2: 30 mEq/L (ref 19–32)
CREATININE: 1 mg/dL (ref 0.40–1.50)
GFR: 88.33 mL/min (ref >60.00)
Glucose, Bld: 94 mg/dL (ref 70–99)
Potassium: 3.6 mEq/L (ref 3.5–5.1)
Sodium: 138 mEq/L (ref 135–145)
Total Protein: 7.6 g/dL (ref 6.0–8.3)

## 2017-07-30 LAB — T4, FREE: Free T4: 0.76 ng/dL (ref 0.60–1.60)

## 2017-07-30 LAB — LIPID PANEL
CHOL/HDL RATIO: 4
Cholesterol: 183 mg/dL (ref 0–200)
HDL: 43.2 mg/dL (ref >39.00)
LDL CALC: 122 mg/dL — AB (ref 0–99)
NONHDL: 140.28
Triglycerides: 89 mg/dL (ref 0.0–149.0)
VLDL: 17.8 mg/dL (ref 0.0–40.0)

## 2017-07-30 LAB — T3, FREE: T3 FREE: 3.6 pg/mL (ref 2.3–4.2)

## 2017-07-30 LAB — TSH: TSH: 1.29 u[IU]/mL (ref 0.35–4.50)

## 2017-07-30 NOTE — Telephone Encounter (Signed)
This pt let me know that incorrect testosterone was ordered. Please change it to correct male total testosterone if able. Thank you. Sorry for the error.

## 2017-08-04 ENCOUNTER — Other Ambulatory Visit: Payer: Self-pay

## 2017-08-06 ENCOUNTER — Ambulatory Visit (INDEPENDENT_AMBULATORY_CARE_PROVIDER_SITE_OTHER): Payer: 59 | Admitting: Family Medicine

## 2017-08-06 ENCOUNTER — Encounter: Payer: Self-pay | Admitting: Family Medicine

## 2017-08-06 ENCOUNTER — Other Ambulatory Visit: Payer: Self-pay

## 2017-08-06 VITALS — BP 110/82 | HR 86 | Temp 98.2°F | Ht 66.25 in | Wt 225.5 lb

## 2017-08-06 DIAGNOSIS — B070 Plantar wart: Secondary | ICD-10-CM | POA: Diagnosis not present

## 2017-08-06 DIAGNOSIS — E7800 Pure hypercholesterolemia, unspecified: Secondary | ICD-10-CM | POA: Diagnosis not present

## 2017-08-06 DIAGNOSIS — G4733 Obstructive sleep apnea (adult) (pediatric): Secondary | ICD-10-CM | POA: Diagnosis not present

## 2017-08-06 DIAGNOSIS — Z0000 Encounter for general adult medical examination without abnormal findings: Secondary | ICD-10-CM

## 2017-08-06 DIAGNOSIS — Z23 Encounter for immunization: Secondary | ICD-10-CM

## 2017-08-06 DIAGNOSIS — F331 Major depressive disorder, recurrent, moderate: Secondary | ICD-10-CM | POA: Diagnosis not present

## 2017-08-06 NOTE — Assessment & Plan Note (Signed)
Treated with cryotherapy. 

## 2017-08-06 NOTE — Assessment & Plan Note (Signed)
Encouraged exercise, weight loss, healthy eating habits. ? ?

## 2017-08-06 NOTE — Patient Instructions (Signed)
Keep area covered with bandaid.  Call for repeat treatment if wart returning.

## 2017-08-06 NOTE — Progress Notes (Signed)
Subjective:    Patient ID: Aaron Malone, male    DOB: [birth year], 41 y.o.   MRN: [id number]  HPI   The patient is here for annual wellness exam and preventative care.     New issue:  Plantars wart on right great toe sole.. Painful, no response to salicylic acid treatment  Elevated Cholesterol:  Lab Results  Component Value Date   CHOL 183 07/30/2017   HDL 43.20 07/30/2017   LDLCALC 122 (H) 07/30/2017   LDLDIRECT 147.8 05/03/2013   TRIG 89.0 07/30/2017   CHOLHDL 4 07/30/2017  Using medications without problems: Muscle aches:  Diet compliance:working on healthy diet, but still overeats. Exercise: running until heel issue. Other complaints:   Wt Readings from Last 3 Encounters:  08/06/17 225 lb 8 oz (102.3 kg)  10/28/16 216 lb 9.6 oz (98.2 kg)  07/31/16 208 lb 8 oz (94.6 kg)   Seeing [facility] for heel spur.  OSA.. followed by Dr. Annamaria Boots. Using CPAP.  Depression,  Moderate controlled:  On pristiq. PHQ9 12. Does not wish to have referral to counselor or mood change. Has done counseling in past Uses propranolol for situational anxiety.   Social History /Family History/Past Medical History reviewed in detail and updated in EMR if needed. Blood pressure 110/82, pulse 86, temperature 98.2 F (36.8 C), temperature source Oral, height 5' 6.25" (1.683 m), weight 225 lb 8 oz (102.3 kg).  Review of Systems  Constitutional: Negative for fatigue and fever.  HENT: Negative for ear pain.   Eyes: Negative for pain.  Respiratory: Negative for cough and shortness of breath.   Cardiovascular: Negative for chest pain, palpitations and leg swelling.  Gastrointestinal: Negative for abdominal pain.  Genitourinary: Negative for dysuria.  Musculoskeletal: Negative for arthralgias.  Neurological: Negative for syncope, light-headedness and headaches.  Psychiatric/Behavioral: Negative for dysphoric mood.       Objective:   Physical Exam  Constitutional: He appears well-developed and  well-nourished.  Non-toxic appearance. He does not appear ill. No distress.  HENT:  Head: Normocephalic and atraumatic.  Right Ear: Hearing, tympanic membrane, external ear and ear canal normal.  Left Ear: Hearing, tympanic membrane, external ear and ear canal normal.  Nose: Nose normal.  Mouth/Throat: Uvula is midline, oropharynx is clear and moist and mucous membranes are normal.  Eyes: Conjunctivae, EOM and lids are normal. Pupils are equal, round, and reactive to light. Lids are everted and swept, no foreign bodies found.  Neck: Trachea normal, normal range of motion and phonation normal. Neck supple. Carotid bruit is not present. No thyroid mass and no thyromegaly present.  Cardiovascular: Normal rate, regular rhythm, S1 normal, S2 normal, intact distal pulses and normal pulses. Exam reveals no gallop.  No murmur heard. Pulmonary/Chest: Breath sounds normal. He has no wheezes. He has no rhonchi. He has no rales.  Abdominal: Soft. Normal appearance and bowel sounds are normal. There is no hepatosplenomegaly. There is no tenderness. There is no rebound, no guarding and no CVA tenderness. No hernia.  Lymphadenopathy:    He has no cervical adenopathy.  Neurological: He is alert. He has normal strength and normal reflexes. No cranial nerve deficit or sensory deficit. Gait normal.  Skin: Skin is warm, dry and intact. No rash noted.  Plantars wart on base of right great toe  Psychiatric: He has a normal mood and affect. His speech is normal and behavior is normal. Judgment normal.     PROCEDURE: crypotherapy performed  X 3 on sole of great toe  for plantars wart with 3 mm halo, no complications.      Assessment & Plan:  The patient's preventative maintenance and recommended screening tests for an annual wellness exam were reviewed in full today. Brought up to date unless services declined.  Counselled on the importance of diet, exercise, and its role in overall health and mortality. The  patient's FH and SH was reviewed, including their home life, tobacco status, and drug and alcohol status.   Vaccines: uptodate Td and flu No family history of  first degree prostate cancer.  Mother with mutlple polyps, MGM colon cancer. No symptoms. Pt would like to consider referral to GI at around age 58 for consideration of need for early colonoscopy. No desire for STD screen.  No smoking.

## 2017-08-06 NOTE — Assessment & Plan Note (Signed)
Followed by pulm on CPAP.

## 2017-08-06 NOTE — Assessment & Plan Note (Signed)
No clear secondary cause.  Continue pristiq.

## 2017-08-09 LAB — TESTOS,TOTAL,FREE AND SHBG (FEMALE)
FREE TESTOSTERONE: 95.1 pg/mL (ref 35.0–155.0)
Sex Hormone Binding: 11 nmol/L (ref 10–50)
TESTOSTERONE, TOTAL, LC-MS-MS: 410 ng/dL (ref 250–1100)

## 2017-08-12 DIAGNOSIS — G4733 Obstructive sleep apnea (adult) (pediatric): Secondary | ICD-10-CM | POA: Diagnosis not present

## 2017-08-31 ENCOUNTER — Encounter: Payer: Self-pay | Admitting: Family Medicine

## 2017-08-31 MED ORDER — PROPRANOLOL HCL 10 MG PO TABS: 10.0000 mg | ORAL_TABLET | Freq: Every day | ORAL | 3 refills | Status: DC | PRN

## 2017-08-31 NOTE — Addendum Note (Signed)
Addended by: Carter Kitten on: 08/31/2017 03:47 PM   Modules accepted: Orders

## 2017-08-31 NOTE — Telephone Encounter (Signed)
Last office visit 08/06/2017.  Ok to refill?

## 2017-09-15 DIAGNOSIS — G4733 Obstructive sleep apnea (adult) (pediatric): Secondary | ICD-10-CM | POA: Diagnosis not present

## 2017-09-27 ENCOUNTER — Other Ambulatory Visit: Payer: Self-pay | Admitting: Family Medicine

## 2017-10-06 DIAGNOSIS — H5213 Myopia, bilateral: Secondary | ICD-10-CM | POA: Diagnosis not present

## 2017-10-11 DIAGNOSIS — G4733 Obstructive sleep apnea (adult) (pediatric): Secondary | ICD-10-CM | POA: Diagnosis not present

## 2017-10-15 ENCOUNTER — Other Ambulatory Visit: Payer: Self-pay | Admitting: Family Medicine

## 2017-10-18 DIAGNOSIS — G4733 Obstructive sleep apnea (adult) (pediatric): Secondary | ICD-10-CM | POA: Diagnosis not present

## 2017-11-01 ENCOUNTER — Encounter: Payer: Self-pay | Admitting: Internal Medicine

## 2017-11-03 ENCOUNTER — Ambulatory Visit (INDEPENDENT_AMBULATORY_CARE_PROVIDER_SITE_OTHER): Payer: 59 | Admitting: Internal Medicine

## 2017-11-03 ENCOUNTER — Encounter: Payer: Self-pay | Admitting: Internal Medicine

## 2017-11-03 VITALS — BP 118/76 | HR 86 | Ht 66.0 in | Wt 219.4 lb

## 2017-11-03 DIAGNOSIS — J302 Other seasonal allergic rhinitis: Secondary | ICD-10-CM | POA: Diagnosis not present

## 2017-11-03 DIAGNOSIS — G4733 Obstructive sleep apnea (adult) (pediatric): Secondary | ICD-10-CM | POA: Diagnosis not present

## 2017-11-03 DIAGNOSIS — J3089 Other allergic rhinitis: Secondary | ICD-10-CM

## 2017-11-03 NOTE — Patient Instructions (Signed)
Order- DME [employer]- Ok to replace mask of choice, supplies. Continue auto 5-20, humidifier, AirView  Please call as needed

## 2017-11-03 NOTE — Progress Notes (Signed)
HPI  male never smoker followed for OSA, complicated by allergic rhinitis Unattended Home Sleep Test ([employer]) 06/23/16  AHI 23.4/ hr, desaturation to 78%, body weight 215 lbs  --------------------------------------------------------------------------------------  10/28/2016-42 year old male never smoker followed for OSA, complicated by allergic rhinitis FOLLOWS FOR: DME: [facility] Pt wears CPAP nightly for at least 6 hours. DL attached.  CPAP auto 5-20/[facility] He describes very good start with CPAP. Has settled on a nasal mask, is not snoring, and recognizes he is sleeping better. Download confirms with 100% 4 hour compliance, AHI 0.2/hour.  11/03/18- 42 year old male never smoker followed for OSA, complicated by allergic rhinitis CPAP auto 5-20/[facility] ----OSA; DME: [facility]. Pt wears CPAP nightly and DL attached. Will need to send order for new yearly Rx for supplies.  Doing very well with CPAP.  Sleeps better with it and says his wife will give him the kick if he gets back in bed without it because it stops his snoring.  Comfortable pressure and nasal pillows mask. Download 100% compliance AHI 0.2/hour.  ROS-see HPI   + = positive Constitutional:   + weight loss, night sweats, fevers, chills, fatigue, lassitude. HEENT:    +headaches, difficulty swallowing, tooth/dental problems, sore throat,       sneezing, itching, ear ache, nasal congestion, post nasal drip, snoring CV:    chest pain, orthopnea, PND, swelling in lower extremities, anasarca,                                                        dizziness, palpitations Resp:   shortness of breath with exertion or at rest.                productive cough,   non-productive cough, coughing up of blood.              change in color of mucus.  wheezing.   Skin:    rash or lesions. GI:  No-   heartburn, indigestion, abdominal pain, nausea, vomiting, diarrhea,                 change in bowel habits, loss of appetite GU: dysuria, change in  color of urine, no urgency or frequency.   flank pain. MS:   joint pain, stiffness, decreased range of motion, back pain. Neuro-     nothing unusual Psych:  change in mood or affect.  +depression or anxiety.   memory loss.  OBJ- Physical Exam General- Alert, Oriented, Affect-appropriate, Distress- none acute, + overweight Skin- rash-none, lesions- none, excoriation- none Lymphadenopathy- none Head- atraumatic            Eyes- Gross vision intact, PERRLA, conjunctivae and secretions clear            Ears- Hearing, canals-normal            Nose- Clear, no-Septal dev, mucus, polyps, erosion, perforation             Throat- Mallampati III , mucosa clear , drainage- none, tonsils- atrophic Neck- flexible , trachea midline, no stridor , thyroid nl, carotid no bruit Chest - symmetrical excursion , unlabored           Heart/CV- RRR , no murmur , no gallop  , no rub, nl s1 s2                           -  JVD- none , edema- none, stasis changes- none, varices- none           Lung- clear to P&A, wheeze- none, cough- none , dullness-none, rub- none           Chest wall-  Abd-  Br/ Gen/ Rectal- Not done, not indicated Extrem- cyanosis- none, clubbing, none, atrophy- none, strength- nl Neuro- grossly intact to observation

## 2017-11-03 NOTE — Assessment & Plan Note (Signed)
Doing very well with CPAP.  He benefits by better sleep and control of snoring.  Compliance and control are excellent based on download.  We discussed updating supplies when needed.

## 2017-11-03 NOTE — Assessment & Plan Note (Signed)
Not yet having much rhinitis to interfere with CPAP use.  Flonase and Claritin may be appropriate choices when needed.

## 2017-11-17 DIAGNOSIS — G4733 Obstructive sleep apnea (adult) (pediatric): Secondary | ICD-10-CM | POA: Diagnosis not present

## 2018-01-11 DIAGNOSIS — G4733 Obstructive sleep apnea (adult) (pediatric): Secondary | ICD-10-CM | POA: Diagnosis not present

## 2018-02-10 DIAGNOSIS — G4733 Obstructive sleep apnea (adult) (pediatric): Secondary | ICD-10-CM | POA: Diagnosis not present

## 2018-03-12 DIAGNOSIS — G4733 Obstructive sleep apnea (adult) (pediatric): Secondary | ICD-10-CM | POA: Diagnosis not present

## 2018-04-11 DIAGNOSIS — G4733 Obstructive sleep apnea (adult) (pediatric): Secondary | ICD-10-CM | POA: Diagnosis not present

## 2018-04-22 DIAGNOSIS — G4733 Obstructive sleep apnea (adult) (pediatric): Secondary | ICD-10-CM | POA: Diagnosis not present

## 2018-05-04 ENCOUNTER — Other Ambulatory Visit: Payer: Self-pay | Admitting: Family Medicine

## 2018-05-05 ENCOUNTER — Other Ambulatory Visit: Payer: Self-pay | Admitting: Family Medicine

## 2018-05-09 ENCOUNTER — Other Ambulatory Visit: Payer: Self-pay | Admitting: Family Medicine

## 2018-05-11 DIAGNOSIS — G4733 Obstructive sleep apnea (adult) (pediatric): Secondary | ICD-10-CM | POA: Diagnosis not present

## 2018-06-10 DIAGNOSIS — G4733 Obstructive sleep apnea (adult) (pediatric): Secondary | ICD-10-CM | POA: Diagnosis not present

## 2018-07-11 ENCOUNTER — Other Ambulatory Visit: Payer: Self-pay | Admitting: Family Medicine

## 2018-07-11 DIAGNOSIS — G4733 Obstructive sleep apnea (adult) (pediatric): Secondary | ICD-10-CM | POA: Diagnosis not present

## 2018-07-13 ENCOUNTER — Encounter: Payer: Self-pay | Admitting: Physician Assistant

## 2018-07-13 ENCOUNTER — Ambulatory Visit: Payer: Self-pay | Admitting: Physician Assistant

## 2018-07-13 VITALS — BP 110/85 | HR 97 | Temp 98.6°F | Resp 16

## 2018-07-13 DIAGNOSIS — J101 Influenza due to other identified influenza virus with other respiratory manifestations: Secondary | ICD-10-CM

## 2018-07-13 LAB — POCT INFLUENZA A/B
Influenza A, POC: POSITIVE — AB
Influenza B, POC: NEGATIVE

## 2018-07-13 MED ORDER — OSELTAMIVIR PHOSPHATE 75 MG PO CAPS: 75.0000 mg | ORAL_CAPSULE | Freq: Two times a day (BID) | ORAL | 0 refills | Status: AC

## 2018-07-13 NOTE — Patient Instructions (Signed)
Thank you for choosing [facility] for your health care needs.  You have been diagnosed with influenza (the Flu).  Your rapid influenza test was positive for strain A.  Take Tamiflu as prescribed. - oseltamivir (TAMIFLU) 75 MG capsule; Take 1 capsule (75 mg total) by mouth 2 (two) times daily for 5 days.  Dispense: 10 capsule; Refill: 0  Increase fluids. Rest. Continue to use over the counter Tylenol and/or ibuprofen for pain/fever. May use over the counter medication for symptom relief such as Delsym for cough or Sudafed for congestion.  Hope you feel better soon.  Follow-up with [facility] or with your family physician in a few days if symptoms are not improving.  Influenza, Adult Influenza ("the flu") is an infection in the lungs, nose, and throat (respiratory tract). It is caused by a virus. The flu causes many common cold symptoms, as well as a high fever and body aches. It can make you feel very sick. The flu spreads easily from person to person (is contagious). Getting a flu shot (influenza vaccination) every year is the best way to prevent the flu. Follow these instructions at home:  Take over-the-counter and prescription medicines only as told by your doctor.  Use a cool mist humidifier to add moisture (humidity) to the air in your home. This can make it easier to breathe.  Rest as needed.  Drink enough fluid to keep your pee (urine) clear or pale yellow.  Cover your mouth and nose when you cough or sneeze.  Wash your hands with soap and water often, especially after you cough or sneeze. If you cannot use soap and water, use hand sanitizer.  Stay home from work or school as told by your doctor. Unless you are visiting your doctor, try to avoid leaving home until your fever has been gone for 24 hours without the use of medicine.  Keep all follow-up visits as told by your doctor. This is important. How is this prevented?  Getting a yearly (annual) flu shot is the best way  to avoid getting the flu. You may get the flu shot in late summer, fall, or winter. Ask your doctor when you should get your flu shot.  Wash your hands often or use hand sanitizer often.  Avoid contact with people who are sick during cold and flu season.  Eat healthy foods.  Drink plenty of fluids.  Get enough sleep.  Exercise regularly. Contact a doctor if:  You get new symptoms.  You have: ? Chest pain. ? Watery poop (diarrhea). ? A fever.  Your cough gets worse.  You start to have more mucus.  You feel sick to your stomach (nauseous).  You throw up (vomit). Get help right away if:  You start to be short of breath or have trouble breathing.  Your skin or nails turn a bluish color.  You have very bad pain or stiffness in your neck.  You get a sudden headache.  You get sudden pain in your face or ear.  You cannot stop throwing up. This information is not intended to replace advice given to you by your health care provider. Make sure you discuss any questions you have with your health care provider. Document Released: 05/12/2008 Document Revised: 01/09/2016 Document Reviewed: 05/28/2015 Elsevier Interactive Patient Education  2017 [employer].

## 2018-07-13 NOTE — Progress Notes (Signed)
Patient ID: Aaron Malone DOB: 17-Jul-1976 AGE: 42 y.o. MRN: [id number]   PCP: Aaron Sanders, MD   Chief Complaint:  Chief Complaint  Patient presents with  . Fever    LAST NIGHT 102.0 F 101.0 F  . Cough    X 1 DAY   . Headache    X 1 DAY  . Nasal Congestion    X 1 DAY   . Generalized Body Aches    X 1 DAY      Subjective:    HPI:  Aaron Malone is a 42 y.o. male presents for evaluation  Chief Complaint  Patient presents with  . Fever    LAST NIGHT 102.0 F 101.0 F  . Cough    X 1 DAY   . Headache    X 1 DAY  . Nasal Congestion    X 1 DAY   . Generalized Body Aches    X 21 DAY     42 year old male presents to [facility] with 2-day history of flulike symptoms.  Began yesterday midday with postnasal drip.  Patient then developed malaise and body aches.  Patient then developed fever.  Took temperature 101 Fahrenheit.  T-max yesterday evening 102 Fahrenheit.  Patient has been alternating over-the-counter Tylenol and ibuprofen with minimal symptom relief. Patient has also used over-the-counter Benadryl to help with drainage and to help with sleeping.  Today has developed headache, nasal congestion, and dry/nagging cough.    Patient works as a Software engineer, multiple sick contacts.  No known specific exposure to influenza.  Patient did receive this season's influenza vaccination in September 2019.  Patient denies dizziness/lightheadedness, ear pain, sinus pain/pressure, chest pain, shortness of breath, wheezing, nausea/vomiting, abdominal pain, diarrhea, rash.  A complete, at least 10 system review of symptoms was performed, pertinent positives and negatives as mentioned in HPI, otherwise negative.  The following portions of the patient's history were reviewed and updated as appropriate: allergies, current medications and past medical history.  Patient Active Problem List   Diagnosis Date Noted  . Plantar wart of right foot 08/06/2017  . Situational anxiety  07/31/2016  . Nephrolithiasis 07/31/2016  . Obstructive sleep apnea 07/29/2016  . Premature ejaculation 05/03/2012  . 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 Major depressive disorder, recurrent, moderate ([place]) 06/13/2010  . PURE HYPERCHOLESTEROLEMIA 05/30/2010  . Seasonal and perennial allergic rhinitis 11/23/2008  . NEPHROLITHIASIS, HX OF 06/15/2007    No Known Allergies  Current Outpatient Medications on File Prior to Visit  Medication Sig Dispense Refill  . acetaminophen (TYLENOL) 500 MG tablet Take 500 mg by mouth every 6 (six) hours as needed.    . desvenlafaxine (PRISTIQ) 100 MG 24 hr tablet TAKE 1 TABLET BY MOUTH ONCE DAILY 90 tablet 0  . fluticasone (FLONASE) 50 MCG/ACT nasal spray INSTILL 2 SPRAYS INTO BOTH NOSTRILS DAILY 16 g 2  . ibuprofen (ADVIL,MOTRIN) 600 MG tablet Take 600 mg by mouth every 6 (six) hours as needed.      . indapamide (LOZOL) 1.25 MG tablet TAKE 1 TABLET BY MOUTH EVERY MORNING 90 tablet 0  . ipratropium (ATROVENT) 0.03 % nasal spray PLACE 2 SPRAYS INTO BOTH NOSTRILS EVERY 12 HOURS. 30 mL 11  . propranolol (INDERAL) 10 MG tablet Take 1 tablet (10 mg total) by mouth daily as needed. 90 tablet 3  . pseudoephedrine (SUDAFED) 60 MG tablet Take 60 mg by mouth every 4 (four) hours as needed for congestion.    . Turmeric 450 MG CAPS Take  450 mg by mouth daily.    . betamethasone dipropionate (DIPROLENE) 0.05 % cream Apply topically 2 (two) times daily. (Patient not taking: Reported on 07/13/2018) 30 g 0  . ondansetron (ZOFRAN) 4 MG tablet Take 1 tablet (4 mg total) by mouth every 8 (eight) hours as needed for nausea or vomiting. (Patient not taking: Reported on 07/13/2018) 5 tablet 0   No current facility-administered medications on file prior to visit.        Objective:   Vitals:   07/13/18 0852  BP: 110/85  Pulse: 97  Resp: 16  Temp: 98.6 F (37 C)  SpO2: 94%     Wt Readings from Last 3 Encounters:  11/03/17 219 lb 6.4 oz (99.5 kg)  08/06/17 225  lb 8 oz (102.3 kg)  10/28/16 216 lb 9.6 oz (98.2 kg)    Physical Exam:   General Appearance:  Alert, cooperative, appears stated age. In no acute distress. Afebrile.  Head:  Normocephalic, without obvious abnormality, atraumatic  Eyes:  PERRL, conjunctiva/corneas clear, EOM's intact  Ears:  Normal TM's and external ear canals, both ears  Nose: Nares normal, septum midline. Nasal mucosa with very minimal edema and clear rhinorrhea. No sinus tenderness with percussion/palpation.  Throat: Lips, mucosa, and tongue normal; teeth and gums normal. Throat reveals no erythema. Tonsils with no enlargement or exudate.  Neck: Supple, symmetrical, trachea midline, no adenopathy  Lungs:   Clear to auscultation bilaterally, respirations unlabored. Deep inspiration triggers dry cough.  Heart:  Regular rate and rhythm, S1 and S2 normal, no murmur, rub, or gallop  Abdomen:   Soft, non-tender, bowel sounds active all four quadrants,  no masses, no organomegaly  Extremities: Extremities normal, atraumatic, no cyanosis or edema  Pulses: 2+ and symmetric  Skin: Skin color, texture, turgor normal, no rashes or lesions  Lymph nodes: Cervical, supraclavicular, and axillary nodes normal  Neurologic: Normal    Assessment & Plan:    Exam findings, diagnosis etiology and medication use and indications reviewed with patient. Follow-Up and discharge instructions provided. No emergent/urgent issues found on exam.  Patient education was provided.   Patient verbalized understanding of information provided and agrees with plan of care (POC), all questions answered. The patient is advised to call or return to clinic if condition does not see an improvement in symptoms, or to seek the care of the closest emergency department if condition worsens with the below plan.    1. Influenza A  - oseltamivir (TAMIFLU) 75 MG capsule; Take 1 capsule (75 mg total) by mouth 2 (two) times daily for 5 days.  Dispense: 10 capsule;  Refill: 0  Patient with 2-day history of flu-like symptoms. Sudden onset. URI symptoms (PND and cough) with bodyaches and fever. Rapid influenza test performed in office today was POSITIVE for Influenza Strain A. Patient prescribed 5-day course of Tamiflu 75mg  bid. Patient advised to continue Tylenol/ibuprofen for pain/fever and to use OTC medication to treat specific symptoms. Patient given work note of off until this upcoming Monday (five days from today). Patient advised to f/u at [facility] or urgent care or with PCP if symptoms worsen or do not improve by Monday. Patient agrees with plan.    Darlin Priestly, MHS, PA-C Montey Hora, MHS, PA-C Advanced Practice Provider [facility]  [address], [facility], [place], [place] [address] (p):  [phone] [email or web address]

## 2018-07-18 ENCOUNTER — Telehealth: Payer: Self-pay | Admitting: Emergency Medicine

## 2018-07-18 NOTE — Telephone Encounter (Signed)
Left message follow up call from [facility] visit

## 2018-07-19 NOTE — Progress Notes (Signed)
Order(s) created erroneously. Erroneous order ID: [id number]  Order moved by: Randie Heinz  Order move date/time: 07/19/2018 4:38 PM  Source Patient: [id number]  Source Contact: 07/13/2018  Destination Patient: [id number]  Destination Contact: 07/13/2018

## 2018-07-19 NOTE — Progress Notes (Signed)
Order(s) created erroneously. Erroneous order ID: [id number]  Order moved by: Randie Heinz  Order move date/time: 07/19/2018 4:38 PM  Source Patient: [id number]  Source Contact: 07/13/2018  Destination Patient: [id number]  Destination Contact: 07/13/2018

## 2018-07-21 DIAGNOSIS — G4733 Obstructive sleep apnea (adult) (pediatric): Secondary | ICD-10-CM | POA: Diagnosis not present

## 2018-07-25 NOTE — Progress Notes (Signed)
This encounter was created in error - please disregard.

## 2018-08-22 DIAGNOSIS — G4733 Obstructive sleep apnea (adult) (pediatric): Secondary | ICD-10-CM | POA: Diagnosis not present

## 2018-09-09 ENCOUNTER — Telehealth: Payer: Self-pay | Admitting: Family Medicine

## 2018-09-09 DIAGNOSIS — E7800 Pure hypercholesterolemia, unspecified: Secondary | ICD-10-CM

## 2018-09-09 NOTE — Telephone Encounter (Signed)
-----   Message from Lendon Collar, RT sent at 09/05/2018  9:00 AM EST ----- Regarding: Lab orders for Monday 09/12/18 Please enter CPE lab orders for 09/12/18. Thanks!

## 2018-09-12 ENCOUNTER — Other Ambulatory Visit (INDEPENDENT_AMBULATORY_CARE_PROVIDER_SITE_OTHER): Payer: 59

## 2018-09-12 DIAGNOSIS — E7800 Pure hypercholesterolemia, unspecified: Secondary | ICD-10-CM

## 2018-09-12 LAB — COMPREHENSIVE METABOLIC PANEL
ALT: 50 U/L (ref 0–53)
AST: 24 U/L (ref 0–37)
Albumin: 4.5 g/dL (ref 3.5–5.2)
Alkaline Phosphatase: 43 U/L (ref 39–117)
BILIRUBIN TOTAL: 0.5 mg/dL (ref 0.2–1.2)
BUN: 18 mg/dL (ref 6–23)
CHLORIDE: 100 meq/L (ref 96–112)
CO2: 31 meq/L (ref 19–32)
CREATININE: 0.91 mg/dL (ref 0.40–1.50)
Calcium: 9.7 mg/dL (ref 8.4–10.5)
GFR: 92.13 mL/min (ref >60.00)
GLUCOSE: 99 mg/dL (ref 70–99)
Potassium: 4.1 mEq/L (ref 3.5–5.1)
SODIUM: 139 meq/L (ref 135–145)
Total Protein: 7.3 g/dL (ref 6.0–8.3)

## 2018-09-12 LAB — LIPID PANEL
Cholesterol: 225 mg/dL — ABNORMAL HIGH (ref 0–200)
HDL: 45.8 mg/dL (ref >39.00)
LDL CALC: 149 mg/dL — AB (ref 0–99)
NONHDL: 178.77
Total CHOL/HDL Ratio: 5
Triglycerides: 147 mg/dL (ref 0.0–149.0)
VLDL: 29.4 mg/dL (ref 0.0–40.0)

## 2018-09-16 ENCOUNTER — Ambulatory Visit (INDEPENDENT_AMBULATORY_CARE_PROVIDER_SITE_OTHER): Payer: 59 | Admitting: Family Medicine

## 2018-09-16 ENCOUNTER — Encounter: Payer: Self-pay | Admitting: Family Medicine

## 2018-09-16 VITALS — BP 100/70 | HR 87 | Temp 98.4°F | Ht 66.25 in | Wt 229.0 lb

## 2018-09-16 DIAGNOSIS — E7800 Pure hypercholesterolemia, unspecified: Secondary | ICD-10-CM | POA: Diagnosis not present

## 2018-09-16 DIAGNOSIS — F331 Major depressive disorder, recurrent, moderate: Secondary | ICD-10-CM

## 2018-09-16 DIAGNOSIS — Z800 Family history of malignant neoplasm of digestive organs: Secondary | ICD-10-CM

## 2018-09-16 DIAGNOSIS — Z0000 Encounter for general adult medical examination without abnormal findings: Secondary | ICD-10-CM

## 2018-09-16 NOTE — Patient Instructions (Addendum)
We will call you to set up referral to GI.  Get back on track with lifestyle changes.

## 2018-09-16 NOTE — Assessment & Plan Note (Signed)
Good control on  Pristiq. If stops has symptoms return.   Discussed emotional eating and ways to stay on track.

## 2018-09-16 NOTE — Progress Notes (Signed)
Subjective:    Patient ID: Aaron Malone, male    DOB: 07/20/1976, 43 y.o.   MRN: [id number]  HPI The patient is here for annual wellness exam and preventative care.    Elevated Cholesterol:  Worsened control in last year. 1.1% 10 year risk per CVD calc. Lab Results  Component Value Date   CHOL 225 (H) 09/12/2018   HDL 45.80 09/12/2018   LDLCALC 149 (H) 09/12/2018   LDLDIRECT 147.8 05/03/2013   TRIG 147.0 09/12/2018   CHOLHDL 5 09/12/2018  Using medications without problems: Muscle aches:  Diet compliance: moderate Exercise: occ.Berdine Addison with son. Other complaints:  OSA.. followed by Dr. Annamaria Boots. Using CPAP.  Depression,  Moderate controlled:   PHQ9 6, On pristiq.   Emotional eater.. so has caused weight gain.  he knows how to eat health Wt Readings from Last 3 Encounters:  09/16/18 229 lb (103.9 kg)  11/03/17 219 lb 6.4 oz (99.5 kg)  08/06/17 225 lb 8 oz (102.3 kg)    Social History /Family History/Past Medical History reviewed in detail and updated in EMR if needed. Blood pressure 100/70, pulse 87, temperature 98.4 F (36.9 C), temperature source Oral, height 5' 6.25" (1.683 m), weight 229 lb (103.9 kg), SpO2 97 %.  Review of Systems  Constitutional: Negative for fatigue and fever.  HENT: Negative for ear pain.   Eyes: Negative for pain.  Respiratory: Negative for cough and shortness of breath.   Cardiovascular: Negative for chest pain, palpitations and leg swelling.  Gastrointestinal: Negative for abdominal pain.  Genitourinary: Negative for dysuria.  Musculoskeletal: Negative for arthralgias.  Neurological: Negative for syncope, light-headedness and headaches.  Psychiatric/Behavioral: Negative for dysphoric mood.       Objective:   Physical Exam Constitutional:      General: He is not in acute distress.    Appearance: Normal appearance. He is well-developed. He is not ill-appearing or toxic-appearing.  HENT:     Head: Normocephalic and atraumatic.   Right Ear: Hearing, tympanic membrane, ear canal and external ear normal.     Left Ear: Hearing, tympanic membrane, ear canal and external ear normal.     Nose: Nose normal.     Mouth/Throat:     Pharynx: Uvula midline.  Eyes:     General: Lids are normal. Lids are everted, no foreign bodies appreciated.     Conjunctiva/sclera: Conjunctivae normal.     Pupils: Pupils are equal, round, and reactive to light.  Neck:     Musculoskeletal: Normal range of motion and neck supple.     Thyroid: No thyroid mass or thyromegaly.     Vascular: No carotid bruit.     Trachea: Trachea and phonation normal.  Cardiovascular:     Rate and Rhythm: Normal rate and regular rhythm.     Pulses: Normal pulses.     Heart sounds: S1 normal and S2 normal. No murmur. No gallop.   Pulmonary:     Breath sounds: Normal breath sounds. No wheezing, rhonchi or rales.  Abdominal:     General: Bowel sounds are normal.     Palpations: Abdomen is soft.     Tenderness: There is no abdominal tenderness. There is no guarding or rebound.     Hernia: No hernia is present.  Lymphadenopathy:     Cervical: No cervical adenopathy.  Skin:    General: Skin is warm and dry.     Findings: No rash.  Neurological:     Mental Status: He is alert.  Cranial Nerves: No cranial nerve deficit.     Sensory: No sensory deficit.     Gait: Gait normal.     Deep Tendon Reflexes: Reflexes are normal and symmetric.  Psychiatric:        Speech: Speech normal.        Behavior: Behavior normal.        Judgment: Judgment normal.           Assessment & Plan:  The patient's preventative maintenance and recommended screening tests for an annual wellness exam were reviewed in full today. Brought up to date unless services declined.  Counselled on the importance of diet, exercise, and its role in overall health and mortality. The patient's FH and SH was reviewed, including their home life, tobacco status, and drug and alcohol status.     Vaccines: Uptodate Td and flu No family history of  first degree prostate cancer.  Mother with mutiple polyps, MGM colon cancer, uncel large polyp. Nosymptoms. Pt would like to consider referral to GI at around age 54 for consideration of need for early colonoscopy. No desire for STD screen.  No smoking.

## 2018-09-16 NOTE — Assessment & Plan Note (Signed)
Encouraged exercise, weight loss, healthy eating habits.  No indication for statin. 1% 10 year risk and no early family history.

## 2018-09-21 DIAGNOSIS — R202 Paresthesia of skin: Secondary | ICD-10-CM | POA: Diagnosis not present

## 2018-09-21 DIAGNOSIS — M67874 Other specified disorders of tendon, left ankle and foot: Secondary | ICD-10-CM | POA: Diagnosis not present

## 2018-09-30 ENCOUNTER — Telehealth: Payer: Self-pay

## 2018-09-30 NOTE — Telephone Encounter (Signed)
Left message for patient to call back to discuss referral

## 2018-10-04 ENCOUNTER — Other Ambulatory Visit: Payer: Self-pay | Admitting: Family Medicine

## 2018-10-11 DIAGNOSIS — H5213 Myopia, bilateral: Secondary | ICD-10-CM | POA: Diagnosis not present

## 2018-10-11 DIAGNOSIS — G4733 Obstructive sleep apnea (adult) (pediatric): Secondary | ICD-10-CM | POA: Diagnosis not present

## 2018-10-19 ENCOUNTER — Other Ambulatory Visit: Payer: Self-pay | Admitting: Family Medicine

## 2018-10-20 ENCOUNTER — Other Ambulatory Visit: Payer: Self-pay | Admitting: Family Medicine

## 2018-11-01 NOTE — Telephone Encounter (Signed)
This was for routine follow-up of an established patient.  Should be fine to reschedule in 6 months for f/u of sleep apnea.

## 2018-11-01 NOTE — Telephone Encounter (Signed)
Dr. Young please advise on patients e-mail below. Thank you.  

## 2018-11-04 ENCOUNTER — Ambulatory Visit: Payer: Self-pay | Admitting: Internal Medicine

## 2018-11-10 DIAGNOSIS — G4733 Obstructive sleep apnea (adult) (pediatric): Secondary | ICD-10-CM | POA: Diagnosis not present

## 2018-11-11 MED ORDER — FLUTICASONE PROPIONATE 50 MCG/ACT NA SUSP: NASAL | 3 refills | Status: DC

## 2018-12-10 DIAGNOSIS — G4733 Obstructive sleep apnea (adult) (pediatric): Secondary | ICD-10-CM | POA: Diagnosis not present

## 2019-01-10 DIAGNOSIS — G4733 Obstructive sleep apnea (adult) (pediatric): Secondary | ICD-10-CM | POA: Diagnosis not present

## 2019-02-09 DIAGNOSIS — G4733 Obstructive sleep apnea (adult) (pediatric): Secondary | ICD-10-CM | POA: Diagnosis not present

## 2019-02-13 ENCOUNTER — Encounter: Payer: Self-pay | Admitting: Internal Medicine

## 2019-02-13 ENCOUNTER — Other Ambulatory Visit: Payer: Self-pay

## 2019-02-13 ENCOUNTER — Ambulatory Visit (INDEPENDENT_AMBULATORY_CARE_PROVIDER_SITE_OTHER): Payer: 59 | Admitting: Internal Medicine

## 2019-02-13 DIAGNOSIS — G4733 Obstructive sleep apnea (adult) (pediatric): Secondary | ICD-10-CM | POA: Diagnosis not present

## 2019-02-13 NOTE — Progress Notes (Signed)
HPI  male never smoker followed for OSA, complicated by allergic rhinitis Unattended Home Sleep Test ([employer]) 06/23/16  AHI 23.4/ hr, desaturation to 78%, body weight 215 lbs  -------------------------------------------------------------------------------------- . 11/03/18- 43 year old male never smoker followed for OSA, complicated by allergic rhinitis CPAP auto 5-20/[facility]  ----OSA; DME: [facility]. Pt wears CPAP nightly and DL attached. Will need to send order for new yearly Rx for supplies.  Doing very well with CPAP.  Sleeps better with it and says his wife will give him the kick if he gets back in bed without it because it stops his snoring.  Comfortable pressure and nasal pillows mask. Download 100% compliance AHI 0.2/hour.  02/13/2019- 43 year old male never smoker followed for OSA, complicated by allergic rhinitis CPAP auto 5-20/[facility]  Download not available today. -------OSA on CPAP, DME: [facility]; pt reports using it every night, no complaints. His phone app records AHI< 1/ hr. No concerns and now acute health issues.   ROS-see HPI   + = positive Constitutional:   + weight loss, night sweats, fevers, chills, fatigue, lassitude. HEENT:    +headaches, difficulty swallowing, tooth/dental problems, sore throat,       sneezing, itching, ear ache, nasal congestion, post nasal drip, snoring CV:    chest pain, orthopnea, PND, swelling in lower extremities, anasarca,                                                        dizziness, palpitations Resp:   shortness of breath with exertion or at rest.                productive cough,   non-productive cough, coughing up of blood.              change in color of mucus.  wheezing.   Skin:    rash or lesions. GI:  No-   heartburn, indigestion, abdominal pain, nausea, vomiting, diarrhea,                 change in bowel habits, loss of appetite GU: dysuria, change in color of urine, no urgency or frequency.   flank pain. MS:   joint pain,  stiffness, decreased range of motion, back pain. Neuro-     nothing unusual Psych:  change in mood or affect.  +depression or anxiety.   memory loss.  OBJ- Physical Exam General- Alert, Oriented, Affect-appropriate, Distress- none acute,  + overweight Skin- rash-none, lesions- none, excoriation- none Lymphadenopathy- none Head- atraumatic            Eyes- Gross vision intact, PERRLA, conjunctivae and secretions clear            Ears- Hearing, canals-normal            Nose- Clear, no-Septal dev, mucus, polyps, erosion, perforation             Throat- Mallampati III , mucosa clear , drainage- none, tonsils- atrophic Neck- flexible , trachea midline, no stridor , thyroid nl, carotid no bruit Chest - symmetrical excursion , unlabored           Heart/CV- RRR , no murmur , no gallop  , no rub, nl s1 s2                           -  JVD- none , edema- none, stasis changes- none, varices- none           Lung- clear to P&A, wheeze- none, cough- none , dullness-none, rub- none           Chest wall-  Abd-  Br/ Gen/ Rectal- Not done, not indicated Extrem- cyanosis- none, clubbing, none, atrophy- none, strength- nl Neuro- grossly intact to observation

## 2019-02-13 NOTE — Patient Instructions (Signed)
We can continue CPAP auto 5-20, mask of choice, humidifier, supplies, AirView/ card  Please let us know if we can help

## 2019-02-13 NOTE — Assessment & Plan Note (Addendum)
He is satisfied that he benefits from CPAP. No changes are needed. Machine is 40-41 years old. Plan continue autopap 5-20

## 2019-04-10 DIAGNOSIS — G4733 Obstructive sleep apnea (adult) (pediatric): Secondary | ICD-10-CM | POA: Diagnosis not present

## 2019-04-11 ENCOUNTER — Other Ambulatory Visit: Payer: Self-pay | Admitting: Family Medicine

## 2019-04-11 NOTE — Telephone Encounter (Signed)
Last office visit 09/16/2018 for CPE.  Last refilled 10/19/2018 for #90 with 1 refill.  No future appointments.  Ok to refill?

## 2019-04-12 MED ORDER — INDAPAMIDE 1.25 MG PO TABS: 1.2500 mg | ORAL_TABLET | Freq: Every morning | ORAL | 1 refills | Status: DC

## 2019-04-12 NOTE — Addendum Note (Signed)
Addended by: Carter Kitten on: 04/12/2019 09:20 AM   Modules accepted: Orders

## 2019-05-02 ENCOUNTER — Encounter: Payer: Self-pay | Admitting: Internal Medicine

## 2019-05-10 DIAGNOSIS — G4733 Obstructive sleep apnea (adult) (pediatric): Secondary | ICD-10-CM | POA: Diagnosis not present

## 2019-05-29 ENCOUNTER — Encounter: Payer: Self-pay | Admitting: *Deleted

## 2019-06-13 ENCOUNTER — Other Ambulatory Visit: Payer: Self-pay

## 2019-06-13 ENCOUNTER — Ambulatory Visit (INDEPENDENT_AMBULATORY_CARE_PROVIDER_SITE_OTHER): Payer: 59 | Admitting: Internal Medicine

## 2019-06-13 ENCOUNTER — Encounter: Payer: Self-pay | Admitting: Internal Medicine

## 2019-06-13 VITALS — BP 130/70 | HR 81 | Temp 98.6°F | Ht 67.0 in | Wt 245.0 lb

## 2019-06-13 DIAGNOSIS — Z1159 Encounter for screening for other viral diseases: Secondary | ICD-10-CM | POA: Diagnosis not present

## 2019-06-13 DIAGNOSIS — Z8371 Family history of colonic polyps: Secondary | ICD-10-CM

## 2019-06-13 MED ORDER — SUPREP BOWEL PREP KIT 17.5-3.13-1.6 GM/177ML PO SOLN: 1.0000 | ORAL | 0 refills | Status: DC

## 2019-06-13 NOTE — Progress Notes (Signed)
Patient ID: Aaron Malone, male   DOB: 1975-12-26, 43 y.o.   MRN: [id number] HPI: [name] is a 43 yo male with PMH of symptomatic gallstones s/p cholecystectomy, HL, hx OSA using CPAP, who is seen in consultation at the request of Amy Diona Browner to discuss family history of colon cancer and colon polyps.  He is here alone today.  He reports that he is feeling well today.  He denies specific GI complaint.  He does have a strong family history of colon polyps and a relative with history of colon cancer.  His maternal grandmother had colon cancer.  His maternal uncle has had colon polyps.  His mother had multiple colon polyps throughout her lifetime.  She also participated in the Celebrex polyposis study at one point in the past.  He denies change in bowel habit.  No blood in his stool or melena.  No upper GI or hepatobiliary complaint.  He has never had a screening colonoscopy  Past Medical History:  Diagnosis Date  . Carpal tunnel syndrome    bilateral  . Gallstones 2012  . History of kidney stones   . Hyperlipidemia   . Major depressive disorder, single episode, moderate ([place])   . Obesity   . Sleep apnea    uses a CPAP    Past Surgical History:  Procedure Laterality Date  . CARPAL TUNNEL RELEASE Bilateral 08/24/2014   Procedure: RIGHT LIMITED OPEN CARPAL TUNNEL RELEASE, LEFT CARPAL  TUNNEL INJECTION;  Surgeon: Roseanne Kaufman, MD;  Location: [facility];  Service: Orthopedics;  Laterality: Bilateral;  . CARPAL TUNNEL RELEASE Left 06/20/2015   Procedure: LEFT CARPAL TUNNEL RELEASE;  Surgeon: Roseanne Kaufman, MD;  Location: [facility];  Service: Orthopedics;  Laterality: Left;  . CHOLECYSTECTOMY  2012  . INGUINAL HERNIA REPAIR Bilateral 1983  . LITHOTRIPSY  12/2003, 09/2006  . TRIGGER FINGER RELEASE     thumb    Outpatient Medications Prior to Visit  Medication Sig Dispense Refill  . acetaminophen (TYLENOL) 500 MG tablet Take 500 mg by mouth every 6 (six)  hours as needed.    . desvenlafaxine (PRISTIQ) 100 MG 24 hr tablet TAKE 1 TABLET BY MOUTH ONCE A DAY 90 tablet 1  . fluticasone (FLONASE) 50 MCG/ACT nasal spray INSTILL 2 SPRAYS INTO BOTH NOSTRILS DAILY 48 g 3  . ibuprofen (ADVIL,MOTRIN) 600 MG tablet Take 600 mg by mouth every 6 (six) hours as needed.      . indapamide (LOZOL) 1.25 MG tablet Take 1 tablet (1.25 mg total) by mouth every morning. 90 tablet 1  . ipratropium (ATROVENT) 0.03 % nasal spray PLACE 2 SPRAYS INTO BOTH NOSTRILS EVERY 12 HOURS. 30 mL 11  . propranolol (INDERAL) 10 MG tablet TAKE 1 TABLET (10 MG TOTAL) BY MOUTH DAILY AS NEEDED. 90 tablet 3  . pseudoephedrine (SUDAFED) 60 MG tablet Take 60 mg by mouth every 4 (four) hours as needed for congestion.    . Turmeric 450 MG CAPS Take 450 mg by mouth daily.     No facility-administered medications prior to visit.     No Known Allergies  Family History  Problem Relation Age of Onset  . Hyperlipidemia Mother   . Hypertension Mother   . Irritable bowel syndrome Mother   . Colon polyps Mother        x lots  . Polycystic ovary syndrome Sister   . Colon cancer Maternal Grandmother        dx in her 54's or  49's  . Lung cancer Maternal Grandmother   . Heart failure Maternal Grandfather   . Heart disease Maternal Grandfather   . Prostate cancer Maternal Grandfather   . Multiple myeloma Paternal Grandfather   . Diabetes Paternal Grandmother   . Colon polyps Maternal Uncle     Social History   Tobacco Use  . Smoking status: Never Smoker  . Smokeless tobacco: Never Used  Substance Use Topics  . Alcohol use: Yes    Comment: occasionally  . Drug use: No    ROS: As per history of present illness, otherwise negative  BP 130/70   Pulse 81   Temp 98.6 F (37 C)   Ht 5' 7" (1.702 m)   Wt 245 lb (111.1 kg)   BMI 38.37 kg/m  Gen: awake, alert, NAD HEENT: anicteric  CV: RRR, no mrg Pulm: CTA b/l Abd: soft, NT/ND, +BS throughout Ext: no c/c/e Neuro:  nonfocal  RELEVANT LABS AND IMAGING: CBC    Component Value Date/Time   WBC 4.4 07/13/2011 1651   RBC 4.57 07/13/2011 1651   HGB 15.4 08/24/2014 0703   HCT 41.8 07/13/2011 1651   PLT 240 07/13/2011 1651   MCV 91.5 07/13/2011 1651   MCH 31.9 07/13/2011 1651   MCHC 34.9 07/13/2011 1651   RDW 12.5 07/13/2011 1651   LYMPHSABS 1.2 07/13/2011 1651   MONOABS 0.6 07/13/2011 1651   EOSABS 0.0 07/13/2011 1651   BASOSABS 0.0 07/13/2011 1651    CMP     Component Value Date/Time   NA 139 09/12/2018 0823   K 4.1 09/12/2018 0823   CL 100 09/12/2018 0823   CO2 31 09/12/2018 0823   GLUCOSE 99 09/12/2018 0823   BUN 18 09/12/2018 0823   CREATININE 0.91 09/12/2018 0823   CALCIUM 9.7 09/12/2018 0823   PROT 7.3 09/12/2018 0823   ALBUMIN 4.5 09/12/2018 0823   AST 24 09/12/2018 0823   ALT 50 09/12/2018 0823   ALKPHOS 43 09/12/2018 0823   BILITOT 0.5 09/12/2018 0823   GFRNONAA >90 08/22/2014 0835   GFRAA >90 08/22/2014 0835    ASSESSMENT/PLAN: 43 yo male with PMH of symptomatic gallstones s/p cholecystectomy, HL, hx OSA using CPAP, who is seen in consultation at the request of Amy Bedsole to discuss family history of colon cancer and colon polyps.  1.  Family history of colon cancer and colon polyps --I have recommended that we proceed with a screening colonoscopy due to his significant family history of colon polyps and cancer.  We discussed the risk, benefits and alternatives to colonoscopy and he is agreeable and wishes to proceed      TS:[id number], Amy E, Md [place],  [place] [id number]

## 2019-06-13 NOTE — Patient Instructions (Signed)
You have been scheduled for a colonoscopy. Please follow written instructions given to you at your visit today.  Please pick up your prep supplies at the pharmacy within the next 1-3 days. If you use inhalers (even only as needed), please bring them with you on the day of your procedure. Your physician has requested that you go to www.startemmi.com and enter the access code given to you at your visit today. This web site gives a general overview about your procedure. However, you should still follow specific instructions given to you by our office regarding your preparation for the procedure.  If you are age 23 or older, your body mass index should be between 23-30. Your Body mass index is 38.37 kg/m. If this is out of the aforementioned range listed, please consider follow up with your Primary Care Provider.  If you are age 59 or younger, your body mass index should be between 19-25. Your Body mass index is 38.37 kg/m. If this is out of the aformentioned range listed, please consider follow up with your Primary Care Provider.

## 2019-07-05 ENCOUNTER — Encounter: Payer: Self-pay | Admitting: Internal Medicine

## 2019-07-06 ENCOUNTER — Ambulatory Visit (INDEPENDENT_AMBULATORY_CARE_PROVIDER_SITE_OTHER): Payer: 59

## 2019-07-06 ENCOUNTER — Other Ambulatory Visit: Payer: Self-pay | Admitting: Internal Medicine

## 2019-07-06 DIAGNOSIS — Z1159 Encounter for screening for other viral diseases: Secondary | ICD-10-CM | POA: Diagnosis not present

## 2019-07-07 LAB — SARS CORONAVIRUS 2 (TAT 6-24 HRS): SARS Coronavirus 2: NEGATIVE

## 2019-07-10 ENCOUNTER — Encounter: Payer: Self-pay | Admitting: Internal Medicine

## 2019-07-10 ENCOUNTER — Other Ambulatory Visit: Payer: Self-pay

## 2019-07-10 ENCOUNTER — Ambulatory Visit (AMBULATORY_SURGERY_CENTER): Payer: 59 | Admitting: Internal Medicine

## 2019-07-10 VITALS — BP 110/74 | HR 62 | Temp 98.5°F | Resp 18 | Ht 67.0 in | Wt 245.0 lb

## 2019-07-10 DIAGNOSIS — D125 Benign neoplasm of sigmoid colon: Secondary | ICD-10-CM | POA: Diagnosis not present

## 2019-07-10 DIAGNOSIS — D122 Benign neoplasm of ascending colon: Secondary | ICD-10-CM

## 2019-07-10 DIAGNOSIS — Z1211 Encounter for screening for malignant neoplasm of colon: Secondary | ICD-10-CM | POA: Diagnosis not present

## 2019-07-10 DIAGNOSIS — K635 Polyp of colon: Secondary | ICD-10-CM | POA: Diagnosis not present

## 2019-07-10 DIAGNOSIS — Z8371 Family history of colonic polyps: Secondary | ICD-10-CM

## 2019-07-10 DIAGNOSIS — G4733 Obstructive sleep apnea (adult) (pediatric): Secondary | ICD-10-CM | POA: Diagnosis not present

## 2019-07-10 MED ORDER — SODIUM CHLORIDE 0.9 % IV SOLN: 500.0000 mL | Freq: Once | INTRAVENOUS | Status: DC

## 2019-07-10 NOTE — Patient Instructions (Signed)
Thank you for allowing [place] to care for you today!  Await pathology results bu mail, approximately 2 weeks.  Will make recommendation for next colonoscopy at that time.  Resume previous diet and medications today.  Return to your normal activities tomorrow.    YOU HAD AN ENDOSCOPIC PROCEDURE TODAY AT [facility]:   Refer to the procedure report that was given to you for any specific questions about what was found during the examination.  If the procedure report does not answer your questions, please call your gastroenterologist to clarify.  If you requested that your care partner not be given the details of your procedure findings, then the procedure report has been included in a sealed envelope for you to review at your convenience later.  YOU SHOULD EXPECT: Some feelings of bloating in the abdomen. Passage of more gas than usual.  Walking can help get rid of the air that was put into your GI tract during the procedure and reduce the bloating. If you had a lower endoscopy (such as a colonoscopy or flexible sigmoidoscopy) you may notice spotting of blood in your stool or on the toilet paper. If you underwent a bowel prep for your procedure, you may not have a normal bowel movement for a few days.  Please Note:  You might notice some irritation and congestion in your nose or some drainage.  This is from the oxygen used during your procedure.  There is no need for concern and it should clear up in a day or so.  SYMPTOMS TO REPORT IMMEDIATELY:   Following lower endoscopy (colonoscopy or flexible sigmoidoscopy):  Excessive amounts of blood in the stool  Significant tenderness or worsening of abdominal pains  Swelling of the abdomen that is new, acute  Fever of 100F or higher   For urgent or emergent issues, a gastroenterologist can be reached at any hour by calling [phone].   DIET:  We do recommend a small meal at first, but then you may proceed to your regular diet.   Drink plenty of fluids but you should avoid alcoholic beverages for 24 hours.  ACTIVITY:  You should plan to take it easy for the rest of today and you should NOT DRIVE or use heavy machinery until tomorrow (because of the sedation medicines used during the test).    FOLLOW UP: Our staff will call the number listed on your records 48-72 hours following your procedure to check on you and address any questions or concerns that you may have regarding the information given to you following your procedure. If we do not reach you, we will leave a message.  We will attempt to reach you two times.  During this call, we will ask if you have developed any symptoms of COVID 19. If you develop any symptoms (ie: fever, flu-like symptoms, shortness of breath, cough etc.) before then, please call [phone].  If you test positive for Covid 19 in the 2 weeks post procedure, please call and report this information to [place].    If any biopsies were taken you will be contacted by phone or by letter within the next 1-3 weeks.  Please call us at [phone] if you have not heard about the biopsies in 3 weeks.    SIGNATURES/CONFIDENTIALITY: You and/or your care partner have signed paperwork which will be entered into your electronic medical record.  These signatures attest to the fact that that the information above on your After Visit Summary has been reviewed  and is understood.  Full responsibility of the confidentiality of this discharge information lies with you and/or your care-partner. 

## 2019-07-10 NOTE — Progress Notes (Signed)
A/ox3, pleased with MAC, report to RN 

## 2019-07-10 NOTE — Progress Notes (Signed)
VS by CW. Temp by JB. 

## 2019-07-10 NOTE — Op Note (Signed)
[facility] Patient Name: Aaron Malone Procedure Date: 07/10/2019 2:06 PM MRN: [id number] Endoscopist: Jerene Bears , MD Age: 43 Referring MD:  Date of Birth: 1976/02/15 Gender: Male Account #: [id number] Procedure:                Colonoscopy Indications:              Colon cancer screening in patient at increased                            risk: Family history of 1st-degree relative with                            colon polyps before age 28 years (mother), Colon                            cancer screening in patient at increased risk:                            Family history of colorectal cancer in multiple 2nd                            degree relatives; 1st colonoscopy Medicines:                Monitored Anesthesia Care Procedure:                Pre-Anesthesia Assessment:                           - Prior to the procedure, a History and Physical                            was performed, and patient medications and                            allergies were reviewed. The patient's tolerance of                            previous anesthesia was also reviewed. The risks                            and benefits of the procedure and the sedation                            options and risks were discussed with the patient.                            All questions were answered, and informed consent                            was obtained. Prior Anticoagulants: The patient has                            taken no previous anticoagulant or antiplatelet  agents. ASA Grade Assessment: II - A patient with                            mild systemic disease. After reviewing the risks                            and benefits, the patient was deemed in                            satisfactory condition to undergo the procedure.                           After obtaining informed consent, the colonoscope                            was passed under direct vision.  Throughout the                            procedure, the patient's blood pressure, pulse, and                            oxygen saturations were monitored continuously. The                            Colonoscope was introduced through the anus and                            advanced to the cecum, identified by appendiceal                            orifice and ileocecal valve. The colonoscopy was                            performed without difficulty. The patient tolerated                            the procedure well. The quality of the bowel                            preparation was good. The ileocecal valve,                            appendiceal orifice, and rectum were photographed. Scope In: 2:11:14 PM Scope Out: 2:32:37 PM Scope Withdrawal Time: 0 hours 18 minutes 32 seconds  Total Procedure Duration: 0 hours 21 minutes 23 seconds  Findings:                 The digital rectal exam was normal.                           A 3 mm polyp was found in the ascending colon. The                            polyp was sessile. The polyp  was removed with a                            cold snare. Resection and retrieval were complete.                           Two sessile polyps were found in the sigmoid colon.                            The polyps were 3 to 5 mm in size. These polyps                            were removed with a cold snare. Resection and                            retrieval were complete.                           The exam was otherwise without abnormality on                            direct and retroflexion views. Complications:            No immediate complications. Estimated Blood Loss:     Estimated blood loss was minimal. Impression:               - One 3 mm polyp in the ascending colon, removed                            with a cold snare. Resected and retrieved.                           - Two 3 to 5 mm polyps in the sigmoid colon,                            removed  with a cold snare. Resected and retrieved.                           - The examination was otherwise normal on direct                            and retroflexion views. Recommendation:           - Patient has a contact number available for                            emergencies. The signs and symptoms of potential                            delayed complications were discussed with the                            patient. Return to normal activities tomorrow.  Written discharge instructions were provided to the                            patient.                           - Resume previous diet.                           - Continue present medications.                           - Await pathology results.                           - Repeat colonoscopy is recommended for                            surveillance. The colonoscopy date will be                            determined after pathology results from today's                            exam become available for review. Jerene Bears, MD 07/10/2019 2:42:26 PM This report has been signed electronically.

## 2019-07-12 ENCOUNTER — Telehealth: Payer: Self-pay | Admitting: *Deleted

## 2019-07-12 NOTE — Telephone Encounter (Signed)
  Follow up Call-  Call back number 07/10/2019  Post procedure Call Back phone  # [phone]  Permission to leave phone message Yes  Some recent data might be hidden     Patient questions:  Do you have a fever, pain , or abdominal swelling? No. Pain Score  0 *  Have you tolerated food without any problems? Yes.    Have you been able to return to your normal activities? Yes.    Do you have any questions about your discharge instructions: Diet   No. Medications  No. Follow up visit  No.  Do you have questions or concerns about your Care? No.  Actions: * If pain score is 4 or above: No action needed, pain <4.  1. Have you developed a fever since your procedure? no  2.   Have you had an respiratory symptoms (SOB or cough) since your procedure? no  3.   Have you tested positive for COVID 19 since your procedure no  4.   Have you had any family members/close contacts diagnosed with the COVID 19 since your procedure?  no   If yes to any of these questions please route to Joylene John, RN and Alphonsa Gin, Therapist, sports.

## 2019-07-12 NOTE — Telephone Encounter (Signed)
Left message

## 2019-07-17 ENCOUNTER — Encounter: Payer: Self-pay | Admitting: Internal Medicine

## 2019-08-08 DIAGNOSIS — G4733 Obstructive sleep apnea (adult) (pediatric): Secondary | ICD-10-CM | POA: Diagnosis not present

## 2019-09-12 DIAGNOSIS — H5211 Myopia, right eye: Secondary | ICD-10-CM | POA: Diagnosis not present

## 2019-09-18 ENCOUNTER — Other Ambulatory Visit: Payer: Self-pay

## 2019-09-18 ENCOUNTER — Telehealth: Payer: Self-pay | Admitting: Family Medicine

## 2019-09-18 ENCOUNTER — Other Ambulatory Visit (INDEPENDENT_AMBULATORY_CARE_PROVIDER_SITE_OTHER): Payer: 59

## 2019-09-18 DIAGNOSIS — E7800 Pure hypercholesterolemia, unspecified: Secondary | ICD-10-CM | POA: Diagnosis not present

## 2019-09-18 LAB — COMPREHENSIVE METABOLIC PANEL
ALT: 51 U/L (ref 0–53)
AST: 29 U/L (ref 0–37)
Albumin: 4.3 g/dL (ref 3.5–5.2)
Alkaline Phosphatase: 47 U/L (ref 39–117)
BUN: 16 mg/dL (ref 6–23)
CO2: 32 mEq/L (ref 19–32)
Calcium: 9.7 mg/dL (ref 8.4–10.5)
Chloride: 103 mEq/L (ref 96–112)
Creatinine, Ser: 0.85 mg/dL (ref 0.40–1.50)
GFR: 99.18 mL/min (ref >60.00)
Glucose, Bld: 104 mg/dL — ABNORMAL HIGH (ref 70–99)
Potassium: 3.9 mEq/L (ref 3.5–5.1)
Sodium: 142 mEq/L (ref 135–145)
Total Bilirubin: 0.4 mg/dL (ref 0.2–1.2)
Total Protein: 6.6 g/dL (ref 6.0–8.3)

## 2019-09-18 LAB — LIPID PANEL
Cholesterol: 202 mg/dL — ABNORMAL HIGH (ref 0–200)
HDL: 41.4 mg/dL (ref >39.00)
LDL Cholesterol: 140 mg/dL — ABNORMAL HIGH (ref 0–99)
NonHDL: 160.59
Total CHOL/HDL Ratio: 5
Triglycerides: 101 mg/dL (ref 0.0–149.0)
VLDL: 20.2 mg/dL (ref 0.0–40.0)

## 2019-09-18 NOTE — Telephone Encounter (Signed)
-----   Message from Cloyd Stagers, RT sent at 09/08/2019  1:04 PM EST ----- Regarding: Lab Orders for Monday 2.1.2021 Please place lab orders for Monday 2.1.2021, office visit for physical on Friday 2.12.2021 Thank you, Dyke Maes RT(R)

## 2019-09-19 NOTE — Progress Notes (Signed)
No critical labs need to be addressed urgently. We will discuss labs in detail at upcoming office visit.   

## 2019-09-23 ENCOUNTER — Other Ambulatory Visit: Payer: Self-pay | Admitting: Family Medicine

## 2019-09-26 ENCOUNTER — Other Ambulatory Visit: Payer: Self-pay | Admitting: Family Medicine

## 2019-09-29 ENCOUNTER — Encounter: Payer: Self-pay | Admitting: Family Medicine

## 2019-09-29 ENCOUNTER — Other Ambulatory Visit: Payer: Self-pay

## 2019-09-29 ENCOUNTER — Ambulatory Visit (INDEPENDENT_AMBULATORY_CARE_PROVIDER_SITE_OTHER): Payer: 59 | Admitting: Family Medicine

## 2019-09-29 VITALS — BP 120/82 | HR 81 | Temp 98.3°F | Ht 66.5 in | Wt 245.5 lb

## 2019-09-29 DIAGNOSIS — R7309 Other abnormal glucose: Secondary | ICD-10-CM

## 2019-09-29 DIAGNOSIS — Z1321 Encounter for screening for nutritional disorder: Secondary | ICD-10-CM | POA: Diagnosis not present

## 2019-09-29 DIAGNOSIS — Z125 Encounter for screening for malignant neoplasm of prostate: Secondary | ICD-10-CM

## 2019-09-29 DIAGNOSIS — Z0000 Encounter for general adult medical examination without abnormal findings: Secondary | ICD-10-CM

## 2019-09-29 LAB — GLUCOSE, RANDOM: Glucose, Bld: 100 mg/dL — ABNORMAL HIGH (ref 70–99)

## 2019-09-29 LAB — HEMOGLOBIN A1C: Hgb A1c MFr Bld: 5.8 % (ref 4.6–6.5)

## 2019-09-29 LAB — PSA: PSA: 0.41 ng/mL (ref 0.10–4.00)

## 2019-09-29 LAB — VITAMIN D 25 HYDROXY (VIT D DEFICIENCY, FRACTURES): VITD: 14.57 ng/mL — ABNORMAL LOW (ref 30.00–100.00)

## 2019-09-29 NOTE — Progress Notes (Signed)
Chief Complaint  Patient presents with  . Annual Exam    History of Present Illness: HPI  The patient is here for annual wellness exam and preventative care.    Elevated Cholesterol:  Lab Results  Component Value Date   CHOL 202 (H) 09/18/2019   HDL 41.40 09/18/2019   LDLCALC 140 (H) 09/18/2019   LDLDIRECT 147.8 05/03/2013   TRIG 101.0 09/18/2019   CHOLHDL 5 09/18/2019  Using medications without problems: Muscle aches:  Diet compliance: improving Exercise: moderate Other complaints: Wt Readings from Last 3 Encounters:  09/29/19 245 lb 8 oz (111.4 kg)  07/10/19 245 lb (111.1 kg)  06/13/19 245 lb (111.1 kg)    MDD, situational anxiety:  Stable on Pristiq  PHQ3  OSA.. followed by Dr. Annamaria Boots. Using CPAP.   Prediabetes.. will eval with A1C today. Sister with PCOS.  This visit occurred during the SARS-CoV-2 public health emergency.  Safety protocols were in place, including screening questions prior to the visit, additional usage of staff PPE, and extensive cleaning of exam room while observing appropriate contact time as indicated for disinfecting solutions.   COVID 19 screen:  No recent travel or known exposure to COVID19 The patient denies respiratory symptoms of COVID 19 at this time. The importance of social distancing was discussed today.     Review of Systems  Constitutional: Negative for chills and fever.  HENT: Negative for congestion and ear pain.   Eyes: Negative for pain and redness.  Respiratory: Negative for cough and shortness of breath.   Cardiovascular: Negative for chest pain, palpitations and leg swelling.  Gastrointestinal: Negative for abdominal pain, blood in stool, constipation, diarrhea, nausea and vomiting.  Genitourinary: Negative for dysuria.  Musculoskeletal: Negative for falls and myalgias.  Skin: Negative for rash.  Neurological: Negative for dizziness.  Psychiatric/Behavioral: Negative for depression. The patient is not  nervous/anxious.       Past Medical History:  Diagnosis Date  . Carpal tunnel syndrome    bilateral  . Gallstones 2012  . History of kidney stones   . Hyperlipidemia   . Major depressive disorder, single episode, moderate ([place])   . Obesity   . Sleep apnea    uses a CPAP    reports that he has never smoked. He has never used smokeless tobacco. He reports current alcohol use. He reports that he does not use drugs.   Current Outpatient Medications:  .  acetaminophen (TYLENOL) 500 MG tablet, Take 500 mg by mouth every 6 (six) hours as needed., Disp: , Rfl:  .  desvenlafaxine (PRISTIQ) 100 MG 24 hr tablet, TAKE 1 TABLET BY MOUTH ONCE A DAY, Disp: 90 tablet, Rfl: 1 .  fluticasone (FLONASE) 50 MCG/ACT nasal spray, INSTILL 2 SPRAYS INTO BOTH NOSTRILS DAILY, Disp: 48 g, Rfl: 3 .  ibuprofen (ADVIL,MOTRIN) 600 MG tablet, Take 600 mg by mouth every 6 (six) hours as needed.  , Disp: , Rfl:  .  indapamide (LOZOL) 1.25 MG tablet, Take 1 tablet (1.25 mg total) by mouth every morning., Disp: 90 tablet, Rfl: 1 .  ipratropium (ATROVENT) 0.03 % nasal spray, PLACE 2 SPRAYS INTO BOTH NOSTRILS EVERY 12 HOURS., Disp: 30 mL, Rfl: 11 .  propranolol (INDERAL) 10 MG tablet, TAKE 1 TABLET BY MOUTH ONCE A DAY AS NEEDED, Disp: 90 tablet, Rfl: 0 .  pseudoephedrine (SUDAFED) 60 MG tablet, Take 60 mg by mouth every 4 (four) hours as needed for congestion., Disp: , Rfl:  .  Turmeric 450 MG CAPS, Take 450  mg by mouth daily., Disp: , Rfl:    Observations/Objective: Blood pressure 120/82, pulse 81, temperature 98.3 F (36.8 C), temperature source Temporal, height 5' 6.5" (1.689 m), weight 245 lb 8 oz (111.4 kg), SpO2 96 %.  Physical Exam Constitutional:      General: He is not in acute distress.    Appearance: Normal appearance. He is well-developed. He is obese. He is not ill-appearing or toxic-appearing.  HENT:     Head: Normocephalic and atraumatic.     Right Ear: Hearing, tympanic membrane, ear canal and  external ear normal.     Left Ear: Hearing, tympanic membrane, ear canal and external ear normal.     Nose: Nose normal.     Mouth/Throat:     Pharynx: Uvula midline.  Eyes:     General: Lids are normal. Lids are everted, no foreign bodies appreciated.     Conjunctiva/sclera: Conjunctivae normal.     Pupils: Pupils are equal, round, and reactive to light.  Neck:     Thyroid: No thyroid mass or thyromegaly.     Vascular: No carotid bruit.     Trachea: Trachea and phonation normal.  Cardiovascular:     Rate and Rhythm: Normal rate and regular rhythm.     Pulses: Normal pulses.     Heart sounds: S1 normal and S2 normal. No murmur. No gallop.   Pulmonary:     Breath sounds: Normal breath sounds. No wheezing, rhonchi or rales.  Abdominal:     General: Bowel sounds are normal.     Palpations: Abdomen is soft.     Tenderness: There is no abdominal tenderness. There is no guarding or rebound.     Hernia: No hernia is present.  Musculoskeletal:     Cervical back: Normal range of motion and neck supple.  Lymphadenopathy:     Cervical: No cervical adenopathy.  Skin:    General: Skin is warm and dry.     Findings: No rash.  Neurological:     Mental Status: He is alert.     Cranial Nerves: No cranial nerve deficit.     Sensory: No sensory deficit.     Gait: Gait normal.     Deep Tendon Reflexes: Reflexes are normal and symmetric.  Psychiatric:        Speech: Speech normal.        Behavior: Behavior normal.        Judgment: Judgment normal.      Assessment and Plan The patient's preventative maintenance and recommended screening tests for an annual wellness exam were reviewed in full today. Brought up to date unless services declined.  Counselled on the importance of diet, exercise, and its role in overall health and mortality. The patient's FH and SH was reviewed, including their home life, tobacco status, and drug and alcohol status.   Vaccines: Uptodate Td and flu, has  completed second COVID vaccine No family history of  first degreeprostate cancer.  MGF prostate cancer Mother with mutiple polyps, MGM colon cancer, uncle large polyp. Nosymptoms. Colonoscopy: precancerous polyps.. repeat in 5 years. Dr. Hilarie Fredrickson. No desire for STD screen.  No smoking      Eliezer Lofts, MD

## 2019-09-29 NOTE — Patient Instructions (Signed)
Please stop at the lab to have labs drawn.  Work on  exercise, weight loss, healthy eating habits.    Preventive Care 42-42 Years Old, Male Preventive care refers to lifestyle choices and visits with your health care provider that can promote health and wellness. This includes:  A yearly physical exam. This is also called an annual well check.  Regular dental and eye exams.  Immunizations.  Screening for certain conditions.  Healthy lifestyle choices, such as eating a healthy diet, getting regular exercise, not using drugs or products that contain nicotine and tobacco, and limiting alcohol use. What can I expect for my preventive care visit? Physical exam Your health care provider will check:  Height and weight. These may be used to calculate body mass index (BMI), which is a measurement that tells if you are at a healthy weight.  Heart rate and blood pressure.  Your skin for abnormal spots. Counseling Your health care provider may ask you questions about:  Alcohol, tobacco, and drug use.  Emotional well-being.  Home and relationship well-being.  Sexual activity.  Eating habits.  Work and work Statistician. What immunizations do I need?  Influenza (flu) vaccine  This is recommended every year. Tetanus, diphtheria, and pertussis (Tdap) vaccine  You may need a Td booster every 10 years. Varicella (chickenpox) vaccine  You may need this vaccine if you have not already been vaccinated. Zoster (shingles) vaccine  You may need this after age 42. Measles, mumps, and rubella (MMR) vaccine  You may need at least one dose of MMR if you were born in 1957 or later. You may also need a second dose. Pneumococcal conjugate (PCV13) vaccine  You may need this if you have certain conditions and were not previously vaccinated. Pneumococcal polysaccharide (PPSV23) vaccine  You may need one or two doses if you smoke cigarettes or if you have certain conditions. Meningococcal  conjugate (MenACWY) vaccine  You may need this if you have certain conditions. Hepatitis A vaccine  You may need this if you have certain conditions or if you travel or work in places where you may be exposed to hepatitis A. Hepatitis B vaccine  You may need this if you have certain conditions or if you travel or work in places where you may be exposed to hepatitis B. Haemophilus influenzae type b (Hib) vaccine  You may need this if you have certain risk factors. Human papillomavirus (HPV) vaccine  If recommended by your health care provider, you may need three doses over 6 months. You may receive vaccines as individual doses or as more than one vaccine together in one shot (combination vaccines). Talk with your health care provider about the risks and benefits of combination vaccines. What tests do I need? Blood tests  Lipid and cholesterol levels. These may be checked every 5 years, or more frequently if you are over 42 years old.  Hepatitis C test.  Hepatitis B test. Screening  Lung cancer screening. You may have this screening every year starting at age 42 if you have a 30-pack-year history of smoking and currently smoke or have quit within the past 15 years.  Prostate cancer screening. Recommendations will vary depending on your family history and other risks.  Colorectal cancer screening. All adults should have this screening starting at age 42 and continuing until age 42. Your health care provider may recommend screening at age 42 if you are at increased risk. You will have tests every 1-10 years, depending on your results and  the type of screening test.  Diabetes screening. This is done by checking your blood sugar (glucose) after you have not eaten for a while (fasting). You may have this done every 1-3 years.  Sexually transmitted disease (STD) testing. Follow these instructions at home: Eating and drinking  Eat a diet that includes fresh fruits and vegetables, whole  grains, lean protein, and low-fat dairy products.  Take vitamin and mineral supplements as recommended by your health care provider.  Do not drink alcohol if your health care provider tells you not to drink.  If you drink alcohol: ? Limit how much you have to 0-2 drinks a day. ? Be aware of how much alcohol is in your drink. In the [place] one drink equals one 12 oz bottle of beer (355 mL), one 5 oz glass of wine (148 mL), or one 1 oz glass of hard liquor (44 mL). Lifestyle  Take daily care of your teeth and gums.  Stay active. Exercise for at least 30 minutes on 5 or more days each week.  Do not use any products that contain nicotine or tobacco, such as cigarettes, e-cigarettes, and chewing tobacco. If you need help quitting, ask your health care provider.  If you are sexually active, practice safe sex. Use a condom or other form of protection to prevent STIs (sexually transmitted infections).  Talk with your health care provider about taking a low-dose aspirin every day starting at age 42. What's next?  Go to your health care provider once a year for a well check visit.  Ask your health care provider how often you should have your eyes and teeth checked.  Stay up to date on all vaccines. This information is not intended to replace advice given to you by your health care provider. Make sure you discuss any questions you have with your health care provider. Document Revised: 07/28/2018 Document Reviewed: 07/28/2018 Elsevier Patient Education  2020 [employer].

## 2019-10-02 ENCOUNTER — Other Ambulatory Visit: Payer: Self-pay | Admitting: Family Medicine

## 2019-10-03 ENCOUNTER — Other Ambulatory Visit: Payer: Self-pay | Admitting: Family Medicine

## 2019-10-03 MED ORDER — VITAMIN D3 1.25 MG (50000 UT) PO CAPS: 1.0000 | ORAL_CAPSULE | ORAL | 0 refills | Status: DC

## 2019-10-09 DIAGNOSIS — G4733 Obstructive sleep apnea (adult) (pediatric): Secondary | ICD-10-CM | POA: Diagnosis not present

## 2019-10-27 ENCOUNTER — Other Ambulatory Visit (HOSPITAL_COMMUNITY): Payer: Self-pay

## 2019-10-31 ENCOUNTER — Other Ambulatory Visit: Payer: Self-pay | Admitting: Family Medicine

## 2019-11-07 DIAGNOSIS — G4733 Obstructive sleep apnea (adult) (pediatric): Secondary | ICD-10-CM | POA: Diagnosis not present

## 2019-11-29 NOTE — Telephone Encounter (Signed)
Contacted pt and inquired if he needed education for the dexcom. 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 Advised there is no receiver and he will have to download the app. Pt verbalized understanding and will stop by office today to pick up the sample.

## 2019-12-07 ENCOUNTER — Other Ambulatory Visit: Payer: Self-pay | Admitting: Family Medicine

## 2019-12-08 ENCOUNTER — Other Ambulatory Visit: Payer: Self-pay | Admitting: Family Medicine

## 2019-12-08 MED ORDER — DEXCOM G6 RECEIVER DEVI: 0 refills | Status: DC

## 2019-12-08 MED ORDER — DEXCOM G6 SENSOR MISC: 0 refills | Status: DC

## 2019-12-08 MED ORDER — FREESTYLE LIBRE 14 DAY READER DEVI: 0 refills | Status: DC

## 2019-12-08 MED ORDER — DEXCOM G6 TRANSMITTER MISC: 0 refills | Status: DC

## 2019-12-08 MED ORDER — FREESTYLE LIBRE 14 DAY SENSOR MISC: 0 refills | Status: DC

## 2019-12-12 MED ORDER — FREESTYLE TEST VI STRP
ORAL_STRIP | 3 refills | Status: DC
Start: 2019-12-12 — End: 2021-07-07

## 2019-12-12 MED ORDER — FREESTYLE SYSTEM KIT: 1.0000 | PACK | 0 refills | Status: DC | PRN

## 2019-12-12 NOTE — Telephone Encounter (Signed)
Freestyle meter called into [facility].

## 2019-12-12 NOTE — Telephone Encounter (Signed)
Rx for Freestyle meter and test strips sent to [facility].

## 2019-12-18 ENCOUNTER — Other Ambulatory Visit: Payer: Self-pay | Admitting: Family Medicine

## 2019-12-20 ENCOUNTER — Other Ambulatory Visit: Payer: Self-pay | Admitting: Family Medicine

## 2019-12-20 NOTE — Telephone Encounter (Signed)
Last office visit 09/29/2019 for CPE.  Last refilled 10/03/2019 for #12 with 0 refills.  Last Vitamin D 09/29/2019 which was low at 14.57 ng/ml.  Refill?

## 2020-01-08 DIAGNOSIS — G4733 Obstructive sleep apnea (adult) (pediatric): Secondary | ICD-10-CM | POA: Diagnosis not present

## 2020-02-05 DIAGNOSIS — G4733 Obstructive sleep apnea (adult) (pediatric): Secondary | ICD-10-CM | POA: Diagnosis not present

## 2020-02-14 ENCOUNTER — Ambulatory Visit: Payer: 59 | Admitting: Internal Medicine

## 2020-02-20 ENCOUNTER — Other Ambulatory Visit: Payer: Self-pay

## 2020-02-20 MED ORDER — CIPROFLOXACIN HCL 500 MG PO TABS
500.0000 mg | ORAL_TABLET | Freq: Two times a day (BID) | ORAL | 0 refills | Status: AC
Start: 2020-02-20 — End: 2020-02-27

## 2020-02-20 MED ORDER — METRONIDAZOLE 250 MG PO TABS
250.0000 mg | ORAL_TABLET | Freq: Three times a day (TID) | ORAL | 0 refills | Status: AC
Start: 2020-02-20 — End: 2020-02-27

## 2020-02-20 NOTE — Telephone Encounter (Signed)
Please let patient know that based on pictures this does not appear to be an external hemorrhoid as it is away from the immediate anal canal I would recommend 7 days of antibiotics If not improving I will see him for an examination  Cipro 500 mg twice daily and metronidazole 250 mg 3 times daily x7 days Sitz baths as needed Avoid hydrocortisone cream for now  Let me know if he is not comfortable proceeding with the above

## 2020-02-22 ENCOUNTER — Ambulatory Visit: Payer: 59 | Admitting: Family Medicine

## 2020-02-22 ENCOUNTER — Encounter: Payer: Self-pay | Admitting: Family Medicine

## 2020-02-22 ENCOUNTER — Other Ambulatory Visit: Payer: Self-pay

## 2020-02-22 VITALS — BP 100/66 | HR 75 | Temp 97.6°F | Ht 66.5 in | Wt 235.4 lb

## 2020-02-22 DIAGNOSIS — E559 Vitamin D deficiency, unspecified: Secondary | ICD-10-CM | POA: Diagnosis not present

## 2020-02-22 DIAGNOSIS — L989 Disorder of the skin and subcutaneous tissue, unspecified: Secondary | ICD-10-CM

## 2020-02-22 DIAGNOSIS — K612 Anorectal abscess: Secondary | ICD-10-CM

## 2020-02-22 NOTE — Patient Instructions (Signed)
Area is improving.   Start warm sitz baths.  Continue antibiotics. Clean  House hold and bathroom items with warm water or bleach.  

## 2020-02-22 NOTE — Progress Notes (Signed)
Chief Complaint  Patient presents with  . Hemorrhoids    Pt is taking abx for possible infection or abscess gien by Dr Hilarie Fredrickson. Pt noticed a pea-sized knot on rectal area, then having some bleeding from rectum. No change in BMs  . Skin Problem    spot on left upper arm x 6 months    History of Present Illness: HPI    44 year old male presents for hemorrhoids and new skin issue.  Noted pea size lesion on rectum x 1 week.. since then area has ruptured on  02/18/2020   Dr. Hilarie Fredrickson felt area was an abscess... started on cipro and metronidazole. [place] hydrocortisone cream.  Now area open, less pain,  No fever. No flu like symptoms. No further discharge  Only sore with washing area.   Skin lesion x 6 months x right upper arm. Minimal size change.  Sometime sore, occ bleeding.  Has appt with derm 04/2020.  Has not treated with anything.   No skin issues in past except maybe eczema. No family history of  Melanoma.. some minor skin area.   Due for re-eval vit D.   This visit occurred during the SARS-CoV-2 public health emergency.  Safety protocols were in place, including screening questions prior to the visit, additional usage of staff PPE, and extensive cleaning of exam room while observing appropriate contact time as indicated for disinfecting solutions.   COVID 19 screen:  No recent travel or known exposure to COVID19 The patient denies respiratory symptoms of COVID 19 at this time. The importance of social distancing was discussed today.     Review of Systems  Constitutional: Negative for chills and fever.  HENT: Negative for congestion and ear pain.   Eyes: Negative for pain and redness.  Respiratory: Negative for cough and shortness of breath.   Cardiovascular: Negative for chest pain, palpitations and leg swelling.  Gastrointestinal: Negative for abdominal pain, blood in stool, constipation, diarrhea, nausea and vomiting.  Genitourinary: Negative for dysuria.   Musculoskeletal: Negative for falls and myalgias.  Skin: Negative for rash.  Neurological: Negative for dizziness.  Psychiatric/Behavioral: Negative for depression. The patient is not nervous/anxious.       Past Medical History:  Diagnosis Date  . Carpal tunnel syndrome    bilateral  . Gallstones 2012  . History of kidney stones   . Hyperlipidemia   . Major depressive disorder, single episode, moderate ([place])   . Obesity   . Sleep apnea    uses a CPAP    reports that he has never smoked. He has never used smokeless tobacco. He reports current alcohol use. He reports that he does not use drugs.   Current Outpatient Medications:  .  acetaminophen (TYLENOL) 500 MG tablet, Take 500 mg by mouth every 6 (six) hours as needed., Disp: , Rfl:  .  Cholecalciferol (VITAMIN D3) 1.25 MG (50000 UT) CAPS, TAKE 1 CAPSULE BY MOUTH ONCE A WEEK, Disp: 12 capsule, Rfl: 0 .  ciprofloxacin (CIPRO) 500 MG tablet, Take 1 tablet (500 mg total) by mouth 2 (two) times daily for 7 days., Disp: 14 tablet, Rfl: 0 .  Continuous Blood Gluc Receiver (DEXCOM G6 RECEIVER) DEVI, Check blood sugar as recommended., Disp: 1 each, Rfl: 0 .  Continuous Blood Gluc Receiver (FREESTYLE LIBRE 14 DAY READER) DEVI, Check blood sugar as recommended, Disp: 1 each, Rfl: 0 .  Continuous Blood Gluc Sensor (DEXCOM G6 SENSOR) MISC, Check blood sugar as recommended., Disp: 1 each, Rfl: 0 .  Continuous Blood Gluc Sensor (FREESTYLE LIBRE 14 DAY SENSOR) MISC, CHECK BLOOD SUGAR AS RECOMMENDED., Disp: 1 each, Rfl: 11 .  Continuous Blood Gluc Transmit (DEXCOM G6 TRANSMITTER) MISC, Check blood sugar as recommended., Disp: 1 each, Rfl: 0 .  desvenlafaxine (PRISTIQ) 100 MG 24 hr tablet, TAKE 1 TABLET BY MOUTH ONCE DAILY, Disp: 90 tablet, Rfl: 1 .  fluticasone (FLONASE) 50 MCG/ACT nasal spray, INSTILL 2 SPRAYS INTO BOTH NOSTRILS DAILY, Disp: 48 g, Rfl: 1 .  glucose blood (FREESTYLE TEST STRIPS) test strip, Use as instructed, Disp: 100 each, Rfl:  3 .  glucose monitoring kit (FREESTYLE) monitoring kit, 1 each by Does not apply route as needed for other., Disp: 1 each, Rfl: 0 .  ibuprofen (ADVIL,MOTRIN) 600 MG tablet, Take 600 mg by mouth every 6 (six) hours as needed.  , Disp: , Rfl:  .  indapamide (LOZOL) 1.25 MG tablet, TAKE 1 TABLET BY MOUTH EVERY MORNING, Disp: 90 tablet, Rfl: 3 .  ipratropium (ATROVENT) 0.03 % nasal spray, PLACE 2 SPRAYS INTO BOTH NOSTRILS EVERY 12 HOURS., Disp: 30 mL, Rfl: 11 .  metroNIDAZOLE (FLAGYL) 250 MG tablet, Take 1 tablet (250 mg total) by mouth 3 (three) times daily for 7 days., Disp: 21 tablet, Rfl: 0 .  propranolol (INDERAL) 10 MG tablet, TAKE 1 TABLET BY MOUTH ONCE A DAY AS NEEDED, Disp: 90 tablet, Rfl: 1 .  pseudoephedrine (SUDAFED) 60 MG tablet, Take 60 mg by mouth every 4 (four) hours as needed for congestion., Disp: , Rfl:  .  Turmeric 450 MG CAPS, Take 450 mg by mouth daily., Disp: , Rfl:    Observations/Objective: Blood pressure 100/66, pulse 75, temperature 97.6 F (36.4 C), temperature source Temporal, height 5' 6.5" (1.689 m), weight 235 lb 6.4 oz (106.8 kg), SpO2 96 %.  Physical Exam Constitutional:      Appearance: He is well-developed.  HENT:     Head: Normocephalic.     Right Ear: Hearing normal.     Left Ear: Hearing normal.     Nose: Nose normal.  Neck:     Thyroid: No thyroid mass or thyromegaly.     Vascular: No carotid bruit.     Trachea: Trachea normal.  Cardiovascular:     Rate and Rhythm: Normal rate and regular rhythm.     Pulses: Normal pulses.     Heart sounds: Heart sounds not distant. No murmur heard.  No friction rub. No gallop.      Comments: No peripheral edema Pulmonary:     Effort: Pulmonary effort is normal. No respiratory distress.     Breath sounds: Normal breath sounds.  Skin:    General: Skin is warm and dry.     Findings: Lesion present. No rash.     Comments: Left upper arm, dry flaky skin lesion  Psychiatric:        Speech: Speech normal.         Behavior: Behavior normal.        Thought Content: Thought content normal.      Assessment and Plan   Vitamin D deficiency Due for re-eval.  Abscess of anal and rectal regions Area is improving.   Start warm sitz baths.  Continue antibiotics. Clean  House hold and bathroom items with warm water or bleach.   Skin lesion Keep appt with dermatology as planned.     Eliezer Lofts, MD

## 2020-02-26 ENCOUNTER — Other Ambulatory Visit: Payer: Self-pay | Admitting: Internal Medicine

## 2020-03-01 ENCOUNTER — Other Ambulatory Visit: Payer: Self-pay

## 2020-03-01 ENCOUNTER — Other Ambulatory Visit (INDEPENDENT_AMBULATORY_CARE_PROVIDER_SITE_OTHER): Payer: 59

## 2020-03-01 DIAGNOSIS — E559 Vitamin D deficiency, unspecified: Secondary | ICD-10-CM | POA: Diagnosis not present

## 2020-03-01 LAB — VITAMIN D 25 HYDROXY (VIT D DEFICIENCY, FRACTURES): VITD: 56.17 ng/mL (ref 30.00–100.00)

## 2020-03-08 ENCOUNTER — Other Ambulatory Visit: Payer: Self-pay | Admitting: Family Medicine

## 2020-03-26 ENCOUNTER — Other Ambulatory Visit: Payer: Self-pay | Admitting: Family Medicine

## 2020-04-09 ENCOUNTER — Other Ambulatory Visit: Payer: Self-pay

## 2020-04-09 ENCOUNTER — Encounter: Payer: Self-pay | Admitting: Internal Medicine

## 2020-04-09 ENCOUNTER — Ambulatory Visit (INDEPENDENT_AMBULATORY_CARE_PROVIDER_SITE_OTHER): Payer: 59 | Admitting: Internal Medicine

## 2020-04-09 DIAGNOSIS — J302 Other seasonal allergic rhinitis: Secondary | ICD-10-CM | POA: Diagnosis not present

## 2020-04-09 DIAGNOSIS — G4733 Obstructive sleep apnea (adult) (pediatric): Secondary | ICD-10-CM | POA: Diagnosis not present

## 2020-04-09 DIAGNOSIS — J3089 Other allergic rhinitis: Secondary | ICD-10-CM

## 2020-04-09 NOTE — Patient Instructions (Addendum)
We can continue CPAP auto 5-20  Please call if we can help 

## 2020-04-09 NOTE — Progress Notes (Signed)
HPI  male never smoker followed for OSA, complicated by allergic rhinitis Unattended Home Sleep Test ([employer]) 06/23/16  AHI 23.4/ hr, desaturation to 78%, body weight 215 lbs  --------------------------------------------------------------------------------------   02/13/2019- 44 year old male never smoker followed for OSA, complicated by allergic rhinitis CPAP auto 5-20/[facility]  Download not available today. -------OSA on CPAP, DME: [facility]; pt reports using it every night, no complaints. His phone app records AHI< 1/ hr. No concerns and now acute health issues.   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 Machine is 42 years old. He self-paid for it so could apply to replace any time. He will discuss with [facility].  Denies other health issues of concern. Will get flu c=vax through work.  ROS-see HPI   + = positive Constitutional:   + weight loss, night sweats, fevers, chills, fatigue, lassitude. HEENT:    +headaches, difficulty swallowing, tooth/dental problems, sore throat,       sneezing, itching, ear ache, nasal congestion, post nasal drip, snoring CV:    chest pain, orthopnea, PND, swelling in lower extremities, anasarca,                                                        dizziness, palpitations Resp:   shortness of breath with exertion or at rest.                productive cough,   non-productive cough, coughing up of blood.              change in color of mucus.  wheezing.   Skin:    rash or lesions. GI:  No-   heartburn, indigestion, abdominal pain, nausea, vomiting, diarrhea,                 change in bowel habits, loss of appetite GU: dysuria, change in color of urine, no urgency or frequency.   flank pain. MS:   joint pain, stiffness, decreased range of motion,  back pain. Neuro-     nothing unusual Psych:  change in mood or affect.  +depression or anxiety.   memory loss.  OBJ- Physical Exam General- Alert, Oriented, Affect-appropriate, Distress- none acute,  + overweight Skin- rash-none, lesions- none, excoriation- none Lymphadenopathy- none Head- atraumatic            Eyes- Gross vision intact, PERRLA, conjunctivae and secretions clear            Ears- Hearing, canals-normal            Nose- Clear, no-Septal dev, mucus, polyps, erosion, perforation             Throat- Mallampati III , mucosa clear , drainage- none, tonsils- atrophic Neck- flexible , trachea midline, no stridor , thyroid nl, carotid no bruit Chest - symmetrical excursion , unlabored           Heart/CV- RRR , no murmur , no gallop  , no rub, nl s1 s2                           - JVD- none , edema- none, stasis changes- none, varices-  none           Lung- clear to P&A, wheeze- none, cough- none , dullness-none, rub- none           Chest wall-  Abd-  Br/ Gen/ Rectal- Not done, not indicated Extrem- cyanosis- none, clubbing, none, atrophy- none, strength- nl Neuro- grossly intact to observation

## 2020-04-09 NOTE — Assessment & Plan Note (Signed)
Benefits from CPAP, doesn't sleep without it. Plan- continue auto 5-20. Can replace this self-pay machine when ready, but ok for now.

## 2020-04-09 NOTE — Assessment & Plan Note (Signed)
flonase when needed

## 2020-04-22 DIAGNOSIS — K612 Anorectal abscess: Secondary | ICD-10-CM | POA: Insufficient documentation

## 2020-04-22 DIAGNOSIS — E559 Vitamin D deficiency, unspecified: Secondary | ICD-10-CM | POA: Insufficient documentation

## 2020-04-22 NOTE — Assessment & Plan Note (Signed)
Due for re-eval. 

## 2020-04-22 NOTE — Assessment & Plan Note (Signed)
Keep appt with dermatology as planned.

## 2020-04-22 NOTE — Assessment & Plan Note (Signed)
Area is improving.   Start warm sitz baths.  Continue antibiotics. Clean  House hold and bathroom items with warm water or bleach.

## 2020-05-06 DIAGNOSIS — G4733 Obstructive sleep apnea (adult) (pediatric): Secondary | ICD-10-CM | POA: Diagnosis not present

## 2020-05-08 DIAGNOSIS — Z20828 Contact with and (suspected) exposure to other viral communicable diseases: Secondary | ICD-10-CM | POA: Diagnosis not present

## 2020-05-14 DIAGNOSIS — D485 Neoplasm of uncertain behavior of skin: Secondary | ICD-10-CM | POA: Diagnosis not present

## 2020-05-14 DIAGNOSIS — D2362 Other benign neoplasm of skin of left upper limb, including shoulder: Secondary | ICD-10-CM | POA: Diagnosis not present

## 2020-05-14 DIAGNOSIS — D2262 Melanocytic nevi of left upper limb, including shoulder: Secondary | ICD-10-CM | POA: Diagnosis not present

## 2020-05-14 DIAGNOSIS — D2261 Melanocytic nevi of right upper limb, including shoulder: Secondary | ICD-10-CM | POA: Diagnosis not present

## 2020-05-14 DIAGNOSIS — D225 Melanocytic nevi of trunk: Secondary | ICD-10-CM | POA: Diagnosis not present

## 2020-06-07 ENCOUNTER — Other Ambulatory Visit: Payer: Self-pay | Admitting: Family Medicine

## 2020-06-07 MED ORDER — FREESTYLE LIBRE 2 READER DEVI: 0 refills | Status: DC

## 2020-06-07 MED ORDER — FREESTYLE LIBRE 2 SENSOR MISC: 5 refills | Status: DC

## 2020-06-10 ENCOUNTER — Other Ambulatory Visit: Payer: Self-pay | Admitting: Family Medicine

## 2020-08-05 DIAGNOSIS — G4733 Obstructive sleep apnea (adult) (pediatric): Secondary | ICD-10-CM | POA: Diagnosis not present

## 2020-08-17 DIAGNOSIS — U071 COVID-19: Secondary | ICD-10-CM

## 2020-08-17 HISTORY — DX: COVID-19: U07.1

## 2020-08-28 ENCOUNTER — Ambulatory Visit: Payer: 59 | Attending: Internal Medicine

## 2020-08-28 ENCOUNTER — Other Ambulatory Visit (HOSPITAL_COMMUNITY): Payer: Self-pay | Admitting: Internal Medicine

## 2020-08-28 DIAGNOSIS — Z23 Encounter for immunization: Secondary | ICD-10-CM

## 2020-08-28 NOTE — Progress Notes (Signed)
   Covid-19 Vaccination Clinic  Name:  Aaron Malone    MRN: [id number] DOB: 10-06-1975  08/28/2020  Mr. [name] was observed post Covid-19 immunization for 15 minutes without incident. He was provided with Vaccine Information Sheet and instruction to access the V-Safe system.   Mr. [name] was instructed to call 911 with any severe reactions post vaccine: Marland Kitchen Difficulty breathing  . Swelling of face and throat  . A fast heartbeat  . A bad rash all over body  . Dizziness and weakness   Immunizations Administered    Name Date Dose VIS Date Route   Moderna Covid-19 Booster Vaccine 08/28/2020  9:14 AM 0.25 mL 06/05/2020 Intramuscular   Manufacturer: Levan Hurst   Lot: [id number]   [place]: [id number]

## 2020-09-02 ENCOUNTER — Other Ambulatory Visit: Payer: Self-pay | Admitting: Adult Health

## 2020-09-02 DIAGNOSIS — U071 COVID-19: Secondary | ICD-10-CM

## 2020-09-02 NOTE — Progress Notes (Signed)
Ambulatory referral for covid treatment placed for patient.  Sx onset 1/17

## 2020-09-03 ENCOUNTER — Other Ambulatory Visit: Payer: Self-pay | Admitting: Adult Health

## 2020-09-03 DIAGNOSIS — U071 COVID-19: Secondary | ICD-10-CM

## 2020-09-03 MED ORDER — PROMETHAZINE-DM 6.25-15 MG/5ML PO SYRP: 5.0000 mL | ORAL_SOLUTION | Freq: Every evening | ORAL | 0 refills | Status: DC | PRN

## 2020-09-03 NOTE — Progress Notes (Signed)
.  I connected by phone with Aaron Malone on 09/03/2020 at 6:48 PM to discuss the potential use of a new treatment for mild to moderate COVID-19 viral infection in non-hospitalized patients.  This patient is a 45 y.o. male that meets the FDA criteria for Emergency Use Authorization of COVID monoclonal antibody sotrovimab.  Has a (+) direct SARS-CoV-2 viral test result  Has mild or moderate COVID-19   Is NOT hospitalized due to COVID-19  Is within 10 days of symptom onset  Has at least one of the high risk factor(s) for progression to severe COVID-19 and/or hospitalization as defined in EUA.  Specific high risk criteria : BMI > 25, Diabetes and Cardiovascular disease or hypertension   I have spoken and communicated the following to the patient or parent/caregiver regarding COVID monoclonal antibody treatment:  1. FDA has authorized the emergency use for the treatment of mild to moderate COVID-19 in adults and pediatric patients with positive results of direct SARS-CoV-2 viral testing who are 23 years of age and older weighing at least 40 kg, and who are at high risk for progressing to severe COVID-19 and/or hospitalization.  2. The significant known and potential risks and benefits of COVID monoclonal antibody, and the extent to which such potential risks and benefits are unknown.  3. Information on available alternative treatments and the risks and benefits of those alternatives, including clinical trials.  4. Patients treated with COVID monoclonal antibody should continue to self-isolate and use infection control measures (e.g., wear mask, isolate, social distance, avoid sharing personal items, clean and disinfect "high touch" surfaces, and frequent handwashing) according to CDC guidelines.   5. The patient or parent/caregiver has the option to accept or refuse COVID monoclonal antibody treatment.  After reviewing this information with the patient, the patient has agreed to receive one  of the available covid 19 monoclonal antibodies and will be provided an appropriate fact sheet prior to infusion. Scot Dock, NP 09/03/2020 6:48 PM

## 2020-09-04 ENCOUNTER — Ambulatory Visit (HOSPITAL_COMMUNITY)
Admission: RE | Admit: 2020-09-04 | Discharge: 2020-09-04 | Disposition: A | Discharge status: Home / Self Care | Payer: 59 | Source: Ambulatory Visit | Attending: Pulmonary Disease | Admitting: Pulmonary Disease

## 2020-09-04 DIAGNOSIS — U071 COVID-19: Secondary | ICD-10-CM | POA: Diagnosis not present

## 2020-09-04 MED ORDER — ALBUTEROL SULFATE HFA 108 (90 BASE) MCG/ACT IN AERS: 2.0000 | INHALATION_SPRAY | Freq: Once | RESPIRATORY_TRACT | Status: DC | PRN

## 2020-09-04 MED ORDER — EPINEPHRINE 0.3 MG/0.3ML IJ SOAJ: 0.3000 mg | Freq: Once | INTRAMUSCULAR | Status: DC | PRN

## 2020-09-04 MED ORDER — FAMOTIDINE IN NACL 20-0.9 MG/50ML-% IV SOLN: 20.0000 mg | Freq: Once | INTRAVENOUS | Status: DC | PRN

## 2020-09-04 MED ORDER — METHYLPREDNISOLONE SODIUM SUCC 125 MG IJ SOLR: 125.0000 mg | Freq: Once | INTRAMUSCULAR | Status: DC | PRN

## 2020-09-04 MED ORDER — SODIUM CHLORIDE 0.9 % IV SOLN: INTRAVENOUS | Status: DC | PRN

## 2020-09-04 MED ORDER — DIPHENHYDRAMINE HCL 50 MG/ML IJ SOLN: 50.0000 mg | Freq: Once | INTRAMUSCULAR | Status: DC | PRN

## 2020-09-04 MED ORDER — SOTROVIMAB 500 MG/8ML IV SOLN
500.0000 mg | Freq: Once | INTRAVENOUS | Status: AC
  Administered 2020-09-04: 500 mg via INTRAVENOUS

## 2020-09-04 NOTE — Progress Notes (Signed)
Diagnosis: COVID-19  Physician: Dr. Patrick Wright  Procedure: Covid Infusion Clinic Med: Sotrovimab infusion - Provided patient with sotrovimab fact sheet for patients, parents, and caregivers prior to infusion.   Complications: No immediate complications noted  Discharge: Discharged home    

## 2020-09-04 NOTE — Discharge Instructions (Signed)

## 2020-09-04 NOTE — Progress Notes (Signed)
Patient reviewed Fact Sheet for Patients, Parents, and Caregivers for Emergency Use Authorization (EUA) of sotrovimab for the Treatment of Coronavirus. Patient also reviewed and is agreeable to the estimated cost of treatment. Patient is agreeable to proceed.   

## 2020-09-14 ENCOUNTER — Telehealth: Payer: Self-pay | Admitting: Family Medicine

## 2020-09-14 DIAGNOSIS — E7800 Pure hypercholesterolemia, unspecified: Secondary | ICD-10-CM

## 2020-09-14 DIAGNOSIS — E559 Vitamin D deficiency, unspecified: Secondary | ICD-10-CM

## 2020-09-14 NOTE — Telephone Encounter (Signed)
-----   Message from Terri J Walsh sent at 09/09/2020 11:19 AM EST ----- Regarding: Lab orders for Friday, 2.11.22 Patient is scheduled for CPX labs, please order future labs, Thanks , Terri   

## 2020-09-16 ENCOUNTER — Other Ambulatory Visit: Payer: Self-pay | Admitting: Family Medicine

## 2020-09-21 ENCOUNTER — Other Ambulatory Visit: Payer: Self-pay | Admitting: Family Medicine

## 2020-09-23 ENCOUNTER — Other Ambulatory Visit: Payer: Self-pay | Admitting: Family Medicine

## 2020-09-24 ENCOUNTER — Other Ambulatory Visit: Payer: Self-pay | Admitting: Family Medicine

## 2020-09-24 ENCOUNTER — Encounter: Payer: Self-pay | Admitting: *Deleted

## 2020-09-27 ENCOUNTER — Other Ambulatory Visit: Payer: Self-pay

## 2020-09-27 ENCOUNTER — Other Ambulatory Visit (INDEPENDENT_AMBULATORY_CARE_PROVIDER_SITE_OTHER): Payer: 59

## 2020-09-27 DIAGNOSIS — E7800 Pure hypercholesterolemia, unspecified: Secondary | ICD-10-CM | POA: Diagnosis not present

## 2020-09-27 DIAGNOSIS — E559 Vitamin D deficiency, unspecified: Secondary | ICD-10-CM | POA: Diagnosis not present

## 2020-09-27 LAB — LIPID PANEL
Cholesterol: 233 mg/dL — ABNORMAL HIGH (ref 0–200)
HDL: 52.5 mg/dL (ref >39.00)
LDL Cholesterol: 156 mg/dL — ABNORMAL HIGH (ref 0–99)
NonHDL: 180.02
Total CHOL/HDL Ratio: 4
Triglycerides: 122 mg/dL (ref 0.0–149.0)
VLDL: 24.4 mg/dL (ref 0.0–40.0)

## 2020-09-27 LAB — COMPREHENSIVE METABOLIC PANEL
ALT: 72 U/L — ABNORMAL HIGH (ref 0–53)
AST: 33 U/L (ref 0–37)
Albumin: 4.6 g/dL (ref 3.5–5.2)
Alkaline Phosphatase: 48 U/L (ref 39–117)
BUN: 15 mg/dL (ref 6–23)
CO2: 32 mEq/L (ref 19–32)
Calcium: 10 mg/dL (ref 8.4–10.5)
Chloride: 97 mEq/L (ref 96–112)
Creatinine, Ser: 0.9 mg/dL (ref 0.40–1.50)
GFR: 105.46 mL/min (ref >60.00)
Glucose, Bld: 91 mg/dL (ref 70–99)
Potassium: 3.8 mEq/L (ref 3.5–5.1)
Sodium: 139 mEq/L (ref 135–145)
Total Bilirubin: 0.8 mg/dL (ref 0.2–1.2)
Total Protein: 7.8 g/dL (ref 6.0–8.3)

## 2020-09-27 LAB — VITAMIN D 25 HYDROXY (VIT D DEFICIENCY, FRACTURES): VITD: 50.1 ng/mL (ref 30.00–100.00)

## 2020-09-27 NOTE — Progress Notes (Signed)
No critical labs need to be addressed urgently. We will discuss labs in detail at upcoming office visit.   

## 2020-10-04 ENCOUNTER — Other Ambulatory Visit: Payer: Self-pay

## 2020-10-04 ENCOUNTER — Ambulatory Visit (INDEPENDENT_AMBULATORY_CARE_PROVIDER_SITE_OTHER): Payer: 59 | Admitting: Family Medicine

## 2020-10-04 VITALS — BP 130/82 | HR 97 | Temp 98.1°F | Ht 66.0 in | Wt 237.5 lb

## 2020-10-04 DIAGNOSIS — F331 Major depressive disorder, recurrent, moderate: Secondary | ICD-10-CM

## 2020-10-04 DIAGNOSIS — Z0000 Encounter for general adult medical examination without abnormal findings: Secondary | ICD-10-CM | POA: Diagnosis not present

## 2020-10-04 DIAGNOSIS — J8410 Pulmonary fibrosis, unspecified: Secondary | ICD-10-CM

## 2020-10-04 DIAGNOSIS — E7800 Pure hypercholesterolemia, unspecified: Secondary | ICD-10-CM | POA: Diagnosis not present

## 2020-10-04 DIAGNOSIS — E559 Vitamin D deficiency, unspecified: Secondary | ICD-10-CM

## 2020-10-04 NOTE — Assessment & Plan Note (Signed)
Resolved on supplementation. 

## 2020-10-04 NOTE — Progress Notes (Signed)
Patient ID: Aaron Malone, male    DOB: 1976/06/21, 45 y.o.   MRN: [id number]  This visit was conducted in person.  BP 140/82   Pulse 97   Temp 98.1 F (36.7 C) (Temporal)   Ht 5' 6"  (1.676 m)   Wt 237 lb 8 oz (107.7 kg)   SpO2 97%   BMI 38.33 kg/m    CC  Chief Complaint  Patient presents with  . Annual Exam    No concerns     Subjective:   HPI: Aaron Malone is a 45 y.o. male presenting on 10/04/2020 for Annual Exam (No concerns )   COVID  1 months ago, S/P monoclonal antibodies... fatigue improving.  Elevated Cholesterol: Above goal but no clear indication for statin Lab Results  Component Value Date   CHOL 233 (H) 09/27/2020   HDL 52.50 09/27/2020   LDLCALC 156 (H) 09/27/2020   LDLDIRECT 147.8 05/03/2013   TRIG 122.0 09/27/2020   CHOLHDL 4 09/27/2020  Using medications without problems: Muscle aches:  Diet compliance: moderate Exercise: minimal Other complaints: The 10-year ASCVD risk score Mikey Bussing DC Brooke Bonito., et al., 2013) is: 2.1%   Values used to calculate the score:     Age: 6 years     Sex: Male     Is Non-Hispanic African American: No     Diabetic: No     Tobacco smoker: No     Systolic Blood Pressure: 010 mmHg     Is BP treated: No     HDL Cholesterol: 52.5 mg/dL     Total Cholesterol: 233 mg/dL  Has been wearing free style libre .. averaging around 100  OSA on CPAP  MDD: well controlled  on Pristiq, no SE.   Vit D OFH:QRFXJOIT with supplementation 1000 IU daily     Wt Readings from Last 3 Encounters:  10/04/20 237 lb 8 oz (107.7 kg)  04/09/20 240 lb 12.8 oz (109.2 kg)  02/22/20 235 lb 6.4 oz (106.8 kg)   BP Readings from Last 3 Encounters:  10/04/20 140/82  09/04/20 133/87  04/09/20 126/68     Relevant past medical, surgical, family and social history reviewed and updated as indicated. Interim medical history since our last visit reviewed. Allergies and medications reviewed and updated. Outpatient Medications Prior to Visit   Medication Sig Dispense Refill  . acetaminophen (TYLENOL) 500 MG tablet Take 500 mg by mouth every 6 (six) hours as needed.    . Continuous Blood Gluc Receiver (FREESTYLE LIBRE 2 READER) DEVI Use to check blood sugar continuous as instructed. 1 each 0  . Continuous Blood Gluc Sensor (FREESTYLE LIBRE 2 SENSOR) MISC Use to check blood sugar continuous as directed. 1 each 5  . Continuous Blood Gluc Transmit (DEXCOM G6 TRANSMITTER) MISC Check blood sugar as recommended. 1 each 0  . desvenlafaxine (PRISTIQ) 100 MG 24 hr tablet TAKE 1 TABLET BY MOUTH ONCE DAILY 90 tablet 0  . fluticasone (FLONASE) 50 MCG/ACT nasal spray INSTILL 2 SPRAYS INTO BOTH NOSTRILS DAILY 48 g 1  . glucose blood (FREESTYLE TEST STRIPS) test strip Use as instructed 100 each 3  . glucose monitoring kit (FREESTYLE) monitoring kit 1 each by Does not apply route as needed for other. 1 each 0  . ibuprofen (ADVIL,MOTRIN) 600 MG tablet Take 600 mg by mouth every 6 (six) hours as needed.    . indapamide (LOZOL) 1.25 MG tablet TAKE 1 TABLET BY MOUTH EVERY MORNING 90 tablet 0  . ipratropium (ATROVENT)  0.03 % nasal spray PLACE 2 SPRAYS INTO BOTH NOSTRILS EVERY 12 HOURS. 30 mL 11  . promethazine-dextromethorphan (PROMETHAZINE-DM) 6.25-15 MG/5ML syrup Take 5 mLs by mouth at bedtime as needed for cough. 118 mL 0  . propranolol (INDERAL) 10 MG tablet TAKE 1 TABLET BY MOUTH ONCE A DAY AS NEEDED 90 tablet 1  . pseudoephedrine (SUDAFED) 60 MG tablet Take 60 mg by mouth every 4 (four) hours as needed for congestion.    . Turmeric 450 MG CAPS Take 450 mg by mouth daily.     No facility-administered medications prior to visit.     Per HPI unless specifically indicated in ROS section below Review of Systems  Constitutional: Negative for fatigue and fever.  HENT: Negative for ear pain.   Eyes: Negative for pain.  Respiratory: Negative for cough and shortness of breath.   Cardiovascular: Negative for chest pain, palpitations and leg swelling.   Gastrointestinal: Negative for abdominal pain.  Genitourinary: Negative for dysuria.  Musculoskeletal: Negative for arthralgias.  Neurological: Negative for syncope, light-headedness and headaches.  Psychiatric/Behavioral: Negative for dysphoric mood.   Objective:  BP 140/82   Pulse 97   Temp 98.1 F (36.7 C) (Temporal)   Ht 5' 6"  (1.676 m)   Wt 237 lb 8 oz (107.7 kg)   SpO2 97%   BMI 38.33 kg/m   Wt Readings from Last 3 Encounters:  10/04/20 237 lb 8 oz (107.7 kg)  04/09/20 240 lb 12.8 oz (109.2 kg)  02/22/20 235 lb 6.4 oz (106.8 kg)      Physical Exam Constitutional:      General: He is not in acute distress.Vital signs are normal.     Appearance: Normal appearance. He is well-developed and well-nourished. He is not ill-appearing or toxic-appearing.  HENT:     Head: Normocephalic and atraumatic.     Right Ear: Hearing, tympanic membrane, ear canal and external ear normal.     Left Ear: Hearing, tympanic membrane, ear canal and external ear normal.     Nose: Nose normal.     Mouth/Throat:     Mouth: Oropharynx is clear and moist and mucous membranes are normal.     Pharynx: Uvula midline.  Eyes:     General: Lids are normal. Lids are everted, no foreign bodies appreciated.     Extraocular Movements: EOM normal.     Conjunctiva/sclera: Conjunctivae normal.     Pupils: Pupils are equal, round, and reactive to light.  Neck:     Thyroid: No thyroid mass or thyromegaly.     Vascular: No carotid bruit.     Trachea: Trachea and phonation normal.  Cardiovascular:     Rate and Rhythm: Normal rate and regular rhythm.     Pulses: Normal pulses and intact distal pulses.     Heart sounds: S1 normal and S2 normal. Heart sounds not distant. No murmur heard. No friction rub. No gallop.      Comments: No peripheral edema Pulmonary:     Effort: Pulmonary effort is normal. No respiratory distress.     Breath sounds: Normal breath sounds. No wheezing, rhonchi or rales.  Abdominal:      General: Bowel sounds are normal.     Palpations: Abdomen is soft. There is no hepatosplenomegaly.     Tenderness: There is no abdominal tenderness. There is no CVA tenderness, guarding or rebound.     Hernia: No hernia is present.  Musculoskeletal:     Cervical back: Normal range of motion and neck  supple.  Lymphadenopathy:     Cervical: No cervical adenopathy.  Skin:    General: Skin is warm, dry and intact.     Findings: No rash.  Neurological:     Mental Status: He is alert.     Cranial Nerves: No cranial nerve deficit.     Sensory: No sensory deficit.     Gait: Gait normal.     Deep Tendon Reflexes: Strength normal and reflexes are normal and symmetric.  Psychiatric:        Mood and Affect: Mood and affect normal.        Speech: Speech normal.        Behavior: Behavior normal.        Thought Content: Thought content normal.        Judgment: Judgment normal.       Results for orders placed or performed in visit on 09/27/20  VITAMIN D 25 Hydroxy (Vit-D Deficiency, Fractures)  Result Value Ref Range   VITD 50.10 30.00 - 100.00 ng/mL  Comprehensive metabolic panel  Result Value Ref Range   Sodium 139 135 - 145 mEq/L   Potassium 3.8 3.5 - 5.1 mEq/L   Chloride 97 96 - 112 mEq/L   CO2 32 19 - 32 mEq/L   Glucose, Bld 91 70 - 99 mg/dL   BUN 15 6 - 23 mg/dL   Creatinine, Ser 0.90 0.40 - 1.50 mg/dL   Total Bilirubin 0.8 0.2 - 1.2 mg/dL   Alkaline Phosphatase 48 39 - 117 U/L   AST 33 0 - 37 U/L   ALT 72 (H) 0 - 53 U/L   Total Protein 7.8 6.0 - 8.3 g/dL   Albumin 4.6 3.5 - 5.2 g/dL   GFR 105.46 >60.00 mL/min   Calcium 10.0 8.4 - 10.5 mg/dL  Lipid panel  Result Value Ref Range   Cholesterol 233 (H) 0 - 200 mg/dL   Triglycerides 122.0 0.0 - 149.0 mg/dL   HDL 52.50 >39.00 mg/dL   VLDL 24.4 0.0 - 40.0 mg/dL   LDL Cholesterol 156 (H) 0 - 99 mg/dL   Total CHOL/HDL Ratio 4    NonHDL 180.02     This visit occurred during the SARS-CoV-2 public health emergency.  Safety  protocols were in place, including screening questions prior to the visit, additional usage of staff PPE, and extensive cleaning of exam room while observing appropriate contact time as indicated for disinfecting solutions.   COVID 19 screen:  No recent travel or known exposure to COVID19 The patient denies respiratory symptoms of COVID 19 at this time. The importance of social distancing was discussed today.   Assessment and Plan The patient's preventative maintenance and recommended screening tests for an annual wellness exam were reviewed in full today. Brought up to date unless services declined.  Counselled on the importance of diet, exercise, and its role in overall health and mortality. The patient's FH and SH was reviewed, including their home life, tobacco status, and drug and alcohol status.   Vaccines:Uptodate Tdap and flu, has completed three COVID vaccine No family history of first degreeprostate cancer.  MGF prostate cancer Mother with mutiple polyps, MGM colon cancer, uncle large polyp. Nosymptoms. Colonoscopy:2020 precancerous polyps.. repeat in 5 years. Dr. Hilarie Fredrickson. No desire for STD screen.  No smoking   Problem List Items Addressed This Visit    Major depressive disorder, recurrent, moderate (HCC)    Stable, chronic.  Continue current medication.   Pristiq 100 mg daily  Pulmonary granuloma (HCC)    No further eval needed.      PURE HYPERCHOLESTEROLEMIA    Reviewed lifestyle changes needed. No current indication for statin.      Vitamin D deficiency    Resolved on supplementation.       Other Visit Diagnoses    Routine general medical examination at a health care facility    -  Primary       Eliezer Lofts, MD

## 2020-10-04 NOTE — Assessment & Plan Note (Signed)
Reviewed lifestyle changes needed. No current indication for statin.

## 2020-10-04 NOTE — Patient Instructions (Signed)

## 2020-10-04 NOTE — Assessment & Plan Note (Signed)
No further eval needed. 

## 2020-10-04 NOTE — Assessment & Plan Note (Signed)
Stable, chronic.  Continue current medication.   Pristiq 100 mg daily

## 2020-10-09 DIAGNOSIS — H5211 Myopia, right eye: Secondary | ICD-10-CM | POA: Diagnosis not present

## 2020-10-28 ENCOUNTER — Other Ambulatory Visit: Payer: Self-pay | Admitting: Family Medicine

## 2020-10-28 DIAGNOSIS — N200 Calculus of kidney: Secondary | ICD-10-CM

## 2020-10-29 ENCOUNTER — Telehealth: Payer: Self-pay

## 2020-10-29 ENCOUNTER — Encounter: Payer: Self-pay | Admitting: Urology

## 2020-10-29 ENCOUNTER — Ambulatory Visit
Admission: RE | Admit: 2020-10-29 | Discharge: 2020-10-29 | Disposition: A | Discharge status: Home / Self Care | Payer: 59 | Source: Ambulatory Visit | Attending: Urology | Admitting: Urology

## 2020-10-29 ENCOUNTER — Ambulatory Visit
Admission: RE | Admit: 2020-10-29 | Discharge: 2020-10-29 | Disposition: A | Discharge status: Home / Self Care | Payer: 59 | Attending: Urology | Admitting: Urology

## 2020-10-29 ENCOUNTER — Other Ambulatory Visit: Payer: Self-pay | Admitting: *Deleted

## 2020-10-29 ENCOUNTER — Other Ambulatory Visit: Payer: Self-pay

## 2020-10-29 ENCOUNTER — Ambulatory Visit (INDEPENDENT_AMBULATORY_CARE_PROVIDER_SITE_OTHER): Payer: 59 | Admitting: Urology

## 2020-10-29 ENCOUNTER — Other Ambulatory Visit: Payer: Self-pay | Admitting: Urology

## 2020-10-29 VITALS — BP 134/84 | HR 91 | Ht 66.0 in | Wt 238.0 lb

## 2020-10-29 DIAGNOSIS — N200 Calculus of kidney: Secondary | ICD-10-CM

## 2020-10-29 DIAGNOSIS — R109 Unspecified abdominal pain: Secondary | ICD-10-CM | POA: Diagnosis not present

## 2020-10-29 DIAGNOSIS — N201 Calculus of ureter: Secondary | ICD-10-CM

## 2020-10-29 DIAGNOSIS — R1031 Right lower quadrant pain: Secondary | ICD-10-CM | POA: Diagnosis not present

## 2020-10-29 LAB — URINALYSIS, COMPLETE
Bilirubin, UA: NEGATIVE
Glucose, UA: NEGATIVE
Ketones, UA: NEGATIVE
Leukocytes,UA: NEGATIVE
Nitrite, UA: NEGATIVE
Protein,UA: NEGATIVE
Specific Gravity, UA: 1.02 (ref 1.005–1.030)
Urobilinogen, Ur: 0.2 mg/dL (ref 0.2–1.0)
pH, UA: 6.5 (ref 5.0–7.5)

## 2020-10-29 LAB — MICROSCOPIC EXAMINATION: Bacteria, UA: NONE SEEN

## 2020-10-29 MED ORDER — ONDANSETRON 4 MG PO TBDP: 4.0000 mg | ORAL_TABLET | Freq: Three times a day (TID) | ORAL | 0 refills | Status: DC | PRN

## 2020-10-29 MED ORDER — CEPHALEXIN 250 MG PO CAPS: 500.0000 mg | ORAL_CAPSULE | ORAL | Status: AC

## 2020-10-29 MED ORDER — HYDROCODONE-ACETAMINOPHEN 5-325 MG PO TABS: 1.0000 | ORAL_TABLET | Freq: Four times a day (QID) | ORAL | 0 refills | Status: DC | PRN

## 2020-10-29 MED ORDER — TAMSULOSIN HCL 0.4 MG PO CAPS: 0.4000 mg | ORAL_CAPSULE | Freq: Every day | ORAL | 0 refills | Status: DC

## 2020-10-29 NOTE — H&P (View-Only) (Signed)
10/29/2020 10:21 AM   Aaron Malone 08-01-1976 [id number]  Referring provider: Jinny Sanders, MD [address] [place],  [place] [address]  Chief Complaint  Patient presents with  . Nephrolithiasis    HPI: 45 year old male with a personal history of nephrolithiasis presents today for further evaluation of right flank pain.  Patient has a personal history of kidney stones.  He had lithotripsy in 2005 in 2007.  He is not had any stone attacks since.  He is previously followed by Dr. Karsten Ro at [facility].   Today, he reports that he has been having intermittent right flank pain radiating into the right lower abdomen.  This is reminiscent of his previous stone attacks.  This for started over the weekend and was initially more severe.  He is still having some dull intermittent right flank pain today which is not debilitating.  KUB today shows a 6 mm right mid ureteral calculus.  He also has some smaller nonobstructing right-sided renal stones.  He also reports a history of having a renal cyst.  He cannot remember the laterality.  He was told that this was stable over the course of several years and did not need follow-up.  He denies any gross hematuria, dysuria fever chills today.  He does have microscopic blood, 11-30 red blood cells per high-powered field in his urine today but otherwise unremarkable.   PMH: Past Medical History:  Diagnosis Date  . Carpal tunnel syndrome    bilateral  . Gallstones 2012  . History of kidney stones   . Hyperlipidemia   . Major depressive disorder, single episode, moderate ([place])   . Obesity   . Sleep apnea    uses a CPAP    Surgical History: Past Surgical History:  Procedure Laterality Date  . CARPAL TUNNEL RELEASE Bilateral 08/24/2014   Procedure: RIGHT LIMITED OPEN CARPAL TUNNEL RELEASE, LEFT CARPAL  TUNNEL INJECTION;  Surgeon: Roseanne Kaufman, MD;  Location: [facility];  Service: Orthopedics;  Laterality:  Bilateral;  . CARPAL TUNNEL RELEASE Left 06/20/2015   Procedure: LEFT CARPAL TUNNEL RELEASE;  Surgeon: Roseanne Kaufman, MD;  Location: [facility];  Service: Orthopedics;  Laterality: Left;  . CHOLECYSTECTOMY  2012  . INGUINAL HERNIA REPAIR Bilateral 1983  . LITHOTRIPSY  12/2003, 09/2006  . TRIGGER FINGER RELEASE     thumb    Home Medications:  Allergies as of 10/29/2020   No Known Allergies     Medication List       Accurate as of October 29, 2020 10:21 AM. If you have any questions, ask your nurse or doctor.        acetaminophen 500 MG tablet Commonly known as: TYLENOL Take 500 mg by mouth every 6 (six) hours as needed.   desvenlafaxine 100 MG 24 hr tablet Commonly known as: PRISTIQ TAKE 1 TABLET BY MOUTH ONCE DAILY   Dexcom G6 Transmitter Misc Check blood sugar as recommended.   fluticasone 50 MCG/ACT nasal spray Commonly known as: FLONASE INSTILL 2 SPRAYS INTO BOTH NOSTRILS DAILY   FreeStyle Libre 2 Reader Devi Use to check blood sugar continuous as instructed.   FreeStyle Libre 2 Sensor Misc Use to check blood sugar continuous as directed.   FREESTYLE TEST STRIPS test strip Generic drug: glucose blood Use as instructed   glucose monitoring kit monitoring kit 1 each by Does not apply route as needed for other.   HYDROcodone-acetaminophen 5-325 MG tablet Commonly known as: NORCO/VICODIN Take 1 tablet  by mouth every 6 (six) hours as needed for moderate pain. Started by: Hollice Espy, MD   ibuprofen 600 MG tablet Commonly known as: ADVIL Take 600 mg by mouth every 6 (six) hours as needed.   indapamide 1.25 MG tablet Commonly known as: LOZOL TAKE 1 TABLET BY MOUTH EVERY MORNING   ipratropium 0.03 % nasal spray Commonly known as: ATROVENT PLACE 2 SPRAYS INTO BOTH NOSTRILS EVERY 12 HOURS.   promethazine-dextromethorphan 6.25-15 MG/5ML syrup Commonly known as: PROMETHAZINE-DM Take 5 mLs by mouth at bedtime as needed for cough.   propranolol  10 MG tablet Commonly known as: INDERAL TAKE 1 TABLET BY MOUTH ONCE A DAY AS NEEDED   pseudoephedrine 60 MG tablet Commonly known as: SUDAFED Take 60 mg by mouth every 4 (four) hours as needed for congestion.   tamsulosin 0.4 MG Caps capsule Commonly known as: FLOMAX Take 1 capsule (0.4 mg total) by mouth daily. Started by: Hollice Espy, MD   Turmeric 450 MG Caps Take 450 mg by mouth daily.       Allergies: No Known Allergies  Family History: Family History  Problem Relation Age of Onset  . Hyperlipidemia Mother   . Hypertension Mother   . Irritable bowel syndrome Mother   . Colon polyps Mother        x lots  . Polycystic ovary syndrome Sister   . Colon cancer Maternal Grandmother        dx in her 74's or 106's  . Lung cancer Maternal Grandmother   . Heart failure Maternal Grandfather   . Heart disease Maternal Grandfather   . Prostate cancer Maternal Grandfather   . Multiple myeloma Paternal Grandfather   . Diabetes Paternal Grandmother   . Colon polyps Maternal Uncle     Social History:  reports that he has never smoked. He has never used smokeless tobacco. He reports current alcohol use. He reports that he does not use drugs.   Physical Exam: BP 134/84   Pulse 91   Ht _0  (1.676 m)   Wt 238 lb (108 kg)   BMI 38.41 kg/m   Constitutional:  Alert and oriented, No acute distress. HEENT: [place] AT, moist mucus membranes.  Trachea midline, no masses. Cardiovascular: No clubbing, cyanosis, or edema. Respiratory: Normal respiratory effort, no increased work of breathing. Skin: No rashes, bruises or suspicious lesions. Neurologic: Grossly intact, no focal deficits, moving all 4 extremities. Psychiatric: Normal mood and affect.  Laboratory Data: Lab Results  Component Value Date   WBC 4.4 07/13/2011   HGB 15.4 08/24/2014   HCT 41.8 07/13/2011   MCV 91.5 07/13/2011   PLT 240 07/13/2011    Lab Results  Component Value Date   CREATININE 0.90 09/27/2020     Lab Results  Component Value Date   PSA 0.41 09/29/2019    Lab Results  Component Value Date   HGBA1C 5.8 09/29/2019   Pertinent Imaging: KUB images personally reviewed.  There is no previous imaging for comparison.  He does have a approximately 6 mm right mid ureteral calculus along with some smaller nonobstructing left renal calculi approximately 2 mm each x2.  Assessment & Plan:    1. Right ureteral calculus 6 mm mid right ureteral calculus without signs or symptoms of infection  We discussed various treatment options for urolithiasis including observation with or without medical expulsive therapy, shockwave lithotripsy (SWL), ureteroscopy and laser lithotripsy with stent placement, and percutaneous nephrolithotomy.   We discussed that management is based on stone  size, location, density, patient co-morbidities, and patient preference.    Stones <37m in size have a >80% spontaneous passage rate. Data surrounding the use of tamsulosin for medical expulsive therapy is controversial, but meta analyses suggests it is most efficacious for distal stones between 5-165min size. Possible side effects include dizziness/lightheadedness, and retrograde ejaculation.   SWL has a lower stone free rate in a single procedure, but also a lower complication rate compared to ureteroscopy and avoids a stent and associated stent related symptoms. Possible complications include renal hematoma, steinstrasse, and need for additional treatment. We discussed the role of his increased skin to stone distance can lead to decreased efficacy with shockwave lithotripsy.   Ureteroscopy with laser lithotripsy and stent placement has a higher stone free rate than SWL in a single procedure, however increased complication rate including possible infection, ureteral injury, bleeding, and stent related morbidity. Common stent related symptoms include dysuria, urgency/frequency, and flank pain.   After an extensive  discussion of the risks and benefits of the above treatment options, the patient would like to proceed with right ESWL.    Flomax as well as hydrocodone was sent to pharmacy in the interim.  He will strain his urine.  - Urinalysis, Complete - CULTURE, URINE COMPREHENSIVE  AsHollice EspyMD  BuMount Carmel[address] StreetSuJarrettsvilleuBelvedere[place] [id number]

## 2020-10-29 NOTE — Progress Notes (Signed)
10/29/2020 10:21 AM   Aaron Malone 08-01-1976 [id number]  Referring provider: Jinny Sanders, MD [address] [place],  [place] [address]  Chief Complaint  Patient presents with  . Nephrolithiasis    HPI: 45 year old male with a personal history of nephrolithiasis presents today for further evaluation of right flank pain.  Patient has a personal history of kidney stones.  He had lithotripsy in 2005 in 2007.  He is not had any stone attacks since.  He is previously followed by Dr. Karsten Ro at [facility].   Today, he reports that he has been having intermittent right flank pain radiating into the right lower abdomen.  This is reminiscent of his previous stone attacks.  This for started over the weekend and was initially more severe.  He is still having some dull intermittent right flank pain today which is not debilitating.  KUB today shows a 6 mm right mid ureteral calculus.  He also has some smaller nonobstructing right-sided renal stones.  He also reports a history of having a renal cyst.  He cannot remember the laterality.  He was told that this was stable over the course of several years and did not need follow-up.  He denies any gross hematuria, dysuria fever chills today.  He does have microscopic blood, 11-30 red blood cells per high-powered field in his urine today but otherwise unremarkable.   PMH: Past Medical History:  Diagnosis Date  . Carpal tunnel syndrome    bilateral  . Gallstones 2012  . History of kidney stones   . Hyperlipidemia   . Major depressive disorder, single episode, moderate ([place])   . Obesity   . Sleep apnea    uses a CPAP    Surgical History: Past Surgical History:  Procedure Laterality Date  . CARPAL TUNNEL RELEASE Bilateral 08/24/2014   Procedure: RIGHT LIMITED OPEN CARPAL TUNNEL RELEASE, LEFT CARPAL  TUNNEL INJECTION;  Surgeon: Roseanne Kaufman, MD;  Location: [place];  Service: Orthopedics;  Laterality:  Bilateral;  . CARPAL TUNNEL RELEASE Left 06/20/2015   Procedure: LEFT CARPAL TUNNEL RELEASE;  Surgeon: Roseanne Kaufman, MD;  Location: [place];  Service: Orthopedics;  Laterality: Left;  . CHOLECYSTECTOMY  2012  . INGUINAL HERNIA REPAIR Bilateral 1983  . LITHOTRIPSY  12/2003, 09/2006  . TRIGGER FINGER RELEASE     thumb    Home Medications:  Allergies as of 10/29/2020   No Known Allergies     Medication List       Accurate as of October 29, 2020 10:21 AM. If you have any questions, ask your nurse or doctor.        acetaminophen 500 MG tablet Commonly known as: TYLENOL Take 500 mg by mouth every 6 (six) hours as needed.   desvenlafaxine 100 MG 24 hr tablet Commonly known as: PRISTIQ TAKE 1 TABLET BY MOUTH ONCE DAILY   Dexcom G6 Transmitter Misc Check blood sugar as recommended.   fluticasone 50 MCG/ACT nasal spray Commonly known as: FLONASE INSTILL 2 SPRAYS INTO BOTH NOSTRILS DAILY   FreeStyle Libre 2 Reader [place]i Use to check blood sugar continuous as instructed.   FreeStyle Libre 2 Sensor Misc Use to check blood sugar continuous as directed.   FREESTYLE TEST STRIPS test strip Generic drug: glucose blood Use as instructed   glucose monitoring kit monitoring kit 1 each by Does not apply route as needed for other.   HYDROcodone-acetaminophen 5-325 MG tablet Commonly known as: NORCO/VICODIN Take 1 tablet  by mouth every 6 (six) hours as needed for moderate pain. Started by: Hollice Espy, MD   ibuprofen 600 MG tablet Commonly known as: ADVIL Take 600 mg by mouth every 6 (six) hours as needed.   indapamide 1.25 MG tablet Commonly known as: LOZOL TAKE 1 TABLET BY MOUTH EVERY MORNING   ipratropium 0.03 % nasal spray Commonly known as: ATROVENT PLACE 2 SPRAYS INTO BOTH NOSTRILS EVERY 12 HOURS.   promethazine-dextromethorphan 6.25-15 MG/5ML syrup Commonly known as: PROMETHAZINE-DM Take 5 mLs by mouth at bedtime as needed for cough.   propranolol  10 MG tablet Commonly known as: INDERAL TAKE 1 TABLET BY MOUTH ONCE A DAY AS NEEDED   pseudoephedrine 60 MG tablet Commonly known as: SUDAFED Take 60 mg by mouth every 4 (four) hours as needed for congestion.   tamsulosin 0.4 MG Caps capsule Commonly known as: FLOMAX Take 1 capsule (0.4 mg total) by mouth daily. Started by: Hollice Espy, MD   Turmeric 450 MG Caps Take 450 mg by mouth daily.       Allergies: No Known Allergies  Family History: Family History  Problem Relation Age of Onset  . Hyperlipidemia Mother   . Hypertension Mother   . Irritable bowel syndrome Mother   . Colon polyps Mother        x lots  . Polycystic ovary syndrome Sister   . Colon cancer Maternal Grandmother        dx in her 74's or 106's  . Lung cancer Maternal Grandmother   . Heart failure Maternal Grandfather   . Heart disease Maternal Grandfather   . Prostate cancer Maternal Grandfather   . Multiple myeloma Paternal Grandfather   . Diabetes Paternal Grandmother   . Colon polyps Maternal Uncle     Social History:  reports that he has never smoked. He has never used smokeless tobacco. He reports current alcohol use. He reports that he does not use drugs.   Physical Exam: BP 134/84   Pulse 91   Ht _0  (1.676 m)   Wt 238 lb (108 kg)   BMI 38.41 kg/m   Constitutional:  Alert and oriented, No acute distress. HEENT:  AT, moist mucus membranes.  Trachea midline, no masses. Cardiovascular: No clubbing, cyanosis, or edema. Respiratory: Normal respiratory effort, no increased work of breathing. Skin: No rashes, bruises or suspicious lesions. Neurologic: Grossly intact, no focal deficits, moving all 4 extremities. Psychiatric: Normal mood and affect.  Laboratory Data: Lab Results  Component Value Date   WBC 4.4 07/13/2011   HGB 15.4 08/24/2014   HCT 41.8 07/13/2011   MCV 91.5 07/13/2011   PLT 240 07/13/2011    Lab Results  Component Value Date   CREATININE 0.90 09/27/2020     Lab Results  Component Value Date   PSA 0.41 09/29/2019    Lab Results  Component Value Date   HGBA1C 5.8 09/29/2019   Pertinent Imaging: KUB images personally reviewed.  There is no previous imaging for comparison.  He does have a approximately 6 mm right mid ureteral calculus along with some smaller nonobstructing left renal calculi approximately 2 mm each x2.  Assessment & Plan:    1. Right ureteral calculus 6 mm mid right ureteral calculus without signs or symptoms of infection  We discussed various treatment options for urolithiasis including observation with or without medical expulsive therapy, shockwave lithotripsy (SWL), ureteroscopy and laser lithotripsy with stent placement, and percutaneous nephrolithotomy.   We discussed that management is based on stone  size, location, density, patient co-morbidities, and patient preference.    Stones <37m in size have a >80% spontaneous passage rate. Data surrounding the use of tamsulosin for medical expulsive therapy is controversial, but meta analyses suggests it is most efficacious for distal stones between 5-165min size. Possible side effects include dizziness/lightheadedness, and retrograde ejaculation.   SWL has a lower stone free rate in a single procedure, but also a lower complication rate compared to ureteroscopy and avoids a stent and associated stent related symptoms. Possible complications include renal hematoma, steinstrasse, and need for additional treatment. We discussed the role of his increased skin to stone distance can lead to decreased efficacy with shockwave lithotripsy.   Ureteroscopy with laser lithotripsy and stent placement has a higher stone free rate than SWL in a single procedure, however increased complication rate including possible infection, ureteral injury, bleeding, and stent related morbidity. Common stent related symptoms include dysuria, urgency/frequency, and flank pain.   After an extensive  discussion of the risks and benefits of the above treatment options, the patient would like to proceed with right ESWL.    Flomax as well as hydrocodone was sent to pharmacy in the interim.  He will strain his urine.  - Urinalysis, Complete - CULTURE, URINE COMPREHENSIVE  AsHollice EspyMD  BuMount Carmel[address] StreetSuJarrettsvilleuBelvedere[place] [id number] [phone]

## 2020-10-29 NOTE — Telephone Encounter (Signed)
As per dr Erlene Quan ok to send in zofran. Sent medication to pharmacy . Patient aware.

## 2020-10-30 MED ORDER — ONDANSETRON HCL 4 MG/2ML IJ SOLN: 4.0000 mg | Freq: Once | INTRAMUSCULAR | Status: AC | PRN

## 2020-10-30 MED ORDER — SODIUM CHLORIDE 0.9 % IV SOLN: INTRAVENOUS | Status: DC

## 2020-10-30 MED ORDER — DIAZEPAM 5 MG PO TABS: 10.0000 mg | ORAL_TABLET | ORAL | Status: AC

## 2020-10-30 MED ORDER — DIPHENHYDRAMINE HCL 25 MG PO CAPS: 25.0000 mg | ORAL_CAPSULE | ORAL | Status: AC

## 2020-10-31 ENCOUNTER — Encounter
Admission: RE | Disposition: A | Discharge status: Home / Self Care | Payer: Self-pay | Source: Home / Self Care | Attending: Urology

## 2020-10-31 ENCOUNTER — Encounter: Payer: Self-pay | Admitting: Anesthesiology

## 2020-10-31 ENCOUNTER — Ambulatory Visit: Payer: 59

## 2020-10-31 ENCOUNTER — Other Ambulatory Visit: Payer: Self-pay

## 2020-10-31 ENCOUNTER — Other Ambulatory Visit: Payer: Self-pay | Admitting: Urology

## 2020-10-31 ENCOUNTER — Ambulatory Visit
Admission: RE | Admit: 2020-10-31 | Discharge: 2020-10-31 | Disposition: A | Discharge status: Home / Self Care | Payer: 59 | Attending: Urology | Admitting: Urology

## 2020-10-31 DIAGNOSIS — E669 Obesity, unspecified: Secondary | ICD-10-CM | POA: Insufficient documentation

## 2020-10-31 DIAGNOSIS — Z79899 Other long term (current) drug therapy: Secondary | ICD-10-CM | POA: Insufficient documentation

## 2020-10-31 DIAGNOSIS — N200 Calculus of kidney: Secondary | ICD-10-CM

## 2020-10-31 DIAGNOSIS — N202 Calculus of kidney with calculus of ureter: Secondary | ICD-10-CM | POA: Diagnosis not present

## 2020-10-31 DIAGNOSIS — G4730 Sleep apnea, unspecified: Secondary | ICD-10-CM | POA: Insufficient documentation

## 2020-10-31 DIAGNOSIS — N201 Calculus of ureter: Secondary | ICD-10-CM | POA: Diagnosis not present

## 2020-10-31 DIAGNOSIS — I878 Other specified disorders of veins: Secondary | ICD-10-CM | POA: Diagnosis not present

## 2020-10-31 HISTORY — PX: EXTRACORPOREAL SHOCK WAVE LITHOTRIPSY: SHX1557

## 2020-10-31 SURGERY — LITHOTRIPSY, ESWL
Anesthesia: Moderate Sedation | Laterality: Right

## 2020-10-31 MED ORDER — ONDANSETRON HCL 4 MG/2ML IJ SOLN
INTRAMUSCULAR | Status: AC
  Administered 2020-10-31: 4 mg via INTRAVENOUS
  Filled 2020-10-31: qty 2

## 2020-10-31 MED ORDER — DIPHENHYDRAMINE HCL 25 MG PO CAPS
ORAL_CAPSULE | ORAL | Status: AC
  Administered 2020-10-31: 25 mg via ORAL
  Filled 2020-10-31: qty 1

## 2020-10-31 MED ORDER — OXYCODONE-ACETAMINOPHEN 5-325 MG PO TABS
1.0000 | ORAL_TABLET | ORAL | Status: DC | PRN
  Administered 2020-10-31: 1 via ORAL

## 2020-10-31 MED ORDER — DIAZEPAM 5 MG PO TABS
ORAL_TABLET | ORAL | Status: AC
  Administered 2020-10-31: 10 mg via ORAL
  Filled 2020-10-31: qty 2

## 2020-10-31 MED ORDER — OXYCODONE-ACETAMINOPHEN 5-325 MG PO TABS: 1.0000 | ORAL_TABLET | ORAL | 0 refills | Status: DC | PRN

## 2020-10-31 MED ORDER — OXYCODONE-ACETAMINOPHEN 5-325 MG PO TABS
ORAL_TABLET | ORAL | Status: AC
  Filled 2020-10-31: qty 1

## 2020-10-31 NOTE — Discharge Instructions (Addendum)
See [facility] discharge instructions in chart.  AMBULATORY SURGERY  DISCHARGE INSTRUCTIONS   1) The drugs that you were given will stay in your system until tomorrow so for the next 24 hours you should not:  A) Drive an automobile B) Make any legal decisions C) Drink any alcoholic beverage   2) You may resume regular meals tomorrow.  Today it is better to start with liquids and gradually work up to solid foods.  You may eat anything you prefer, but it is better to start with liquids, then soup and crackers, and gradually work up to solid foods.   3) Please notify your doctor immediately if you have any unusual bleeding, trouble breathing, redness and pain at the surgery site, drainage, fever, or pain not relieved by medication.    4) Additional Instructions:        Please contact your physician with any problems or Same Day Surgery at [phone], Monday through Friday 6 am to 4 pm, or [facility] number at [phone].  

## 2020-10-31 NOTE — Interval H&P Note (Signed)
History and Physical Interval Note:  10/31/2020 9:06 AM  Aaron Malone  has presented today for surgery, with the diagnosis of Kidney Stone.  The various methods of treatment have been discussed with the patient and family. After consideration of risks, benefits and other options for treatment, the patient has consented to  Procedure(s): EXTRACORPOREAL SHOCK WAVE LITHOTRIPSY (ESWL) (Right) as a surgical intervention.  The patient's history has been reviewed, patient examined, no change in status, stable for surgery.  I have reviewed the patient's chart and labs.  Questions were answered to the patient's satisfaction.    RRR CTAB  Hollice Espy

## 2020-11-01 ENCOUNTER — Encounter: Payer: Self-pay | Admitting: Urology

## 2020-11-04 DIAGNOSIS — G4733 Obstructive sleep apnea (adult) (pediatric): Secondary | ICD-10-CM | POA: Diagnosis not present

## 2020-11-04 LAB — CULTURE, URINE COMPREHENSIVE

## 2020-11-11 ENCOUNTER — Other Ambulatory Visit: Payer: Self-pay | Admitting: Family Medicine

## 2020-11-11 ENCOUNTER — Encounter: Payer: Self-pay | Admitting: Urology

## 2020-11-11 ENCOUNTER — Other Ambulatory Visit: Payer: Self-pay | Admitting: *Deleted

## 2020-11-11 MED ORDER — FLUTICASONE PROPIONATE 50 MCG/ACT NA SUSP: 2.0000 | Freq: Every day | NASAL | 3 refills | Status: DC

## 2020-11-13 ENCOUNTER — Other Ambulatory Visit: Payer: Self-pay

## 2020-11-13 DIAGNOSIS — N200 Calculus of kidney: Secondary | ICD-10-CM

## 2020-11-14 ENCOUNTER — Other Ambulatory Visit: Payer: Self-pay | Admitting: Physician Assistant

## 2020-11-14 ENCOUNTER — Encounter: Payer: Self-pay | Admitting: Physician Assistant

## 2020-11-14 ENCOUNTER — Ambulatory Visit
Admission: RE | Admit: 2020-11-14 | Discharge: 2020-11-14 | Disposition: A | Discharge status: Home / Self Care | Payer: 59 | Source: Ambulatory Visit | Attending: Physician Assistant | Admitting: Physician Assistant

## 2020-11-14 ENCOUNTER — Ambulatory Visit (INDEPENDENT_AMBULATORY_CARE_PROVIDER_SITE_OTHER): Payer: 59 | Admitting: Physician Assistant

## 2020-11-14 ENCOUNTER — Other Ambulatory Visit: Payer: Self-pay

## 2020-11-14 ENCOUNTER — Ambulatory Visit
Admission: RE | Admit: 2020-11-14 | Discharge: 2020-11-14 | Disposition: A | Discharge status: Home / Self Care | Payer: 59 | Attending: Physician Assistant | Admitting: Physician Assistant

## 2020-11-14 VITALS — BP 147/91 | HR 91 | Ht 67.0 in | Wt 239.0 lb

## 2020-11-14 DIAGNOSIS — N200 Calculus of kidney: Secondary | ICD-10-CM

## 2020-11-14 DIAGNOSIS — N201 Calculus of ureter: Secondary | ICD-10-CM | POA: Diagnosis not present

## 2020-11-14 DIAGNOSIS — N2889 Other specified disorders of kidney and ureter: Secondary | ICD-10-CM | POA: Diagnosis not present

## 2020-11-14 LAB — URINALYSIS, COMPLETE
Bilirubin, UA: NEGATIVE
Glucose, UA: NEGATIVE
Ketones, UA: NEGATIVE
Leukocytes,UA: NEGATIVE
Nitrite, UA: NEGATIVE
Protein,UA: NEGATIVE
Specific Gravity, UA: 1.02 (ref 1.005–1.030)
Urobilinogen, Ur: 0.2 mg/dL (ref 0.2–1.0)
pH, UA: 7.5 (ref 5.0–7.5)

## 2020-11-14 LAB — MICROSCOPIC EXAMINATION: Bacteria, UA: NONE SEEN

## 2020-11-14 MED ORDER — TAMSULOSIN HCL 0.4 MG PO CAPS: 0.4000 mg | ORAL_CAPSULE | Freq: Every day | ORAL | 0 refills | Status: DC

## 2020-11-14 NOTE — Patient Instructions (Addendum)
For the next 4 weeks, please do the following: -Take Flomax 0.4mg  daily as prescribed today -Stay well hydrated -Strain your urine to catch any fragments that pass -Treat any pain with ibuprofen/tylenol or narcotics as needed -Treat any nausea with Zofran  Dr. Erlene Quan will see you back in clinic in 4 weeks with another x-ray prior to see if you have passed your residual fragments.  Please call our office immediately (we are open 8a-5p Monday-Friday) or go to the Emergency Department if you develop any of the following: -Fever -Chills -Nausea and/or vomiting uncontrollable with Zofran -Pain uncontrollable with narcotics

## 2020-11-14 NOTE — Progress Notes (Signed)
11/14/2020 9:04 AM   Aaron Malone [birth year] [id number]  CC: Chief Complaint  Patient presents with  . Nephrolithiasis    HPI: Aaron Malone is a 45 y.o. male with PMH nephrolithiasis requiring ESWL in 2005 and 2007 now s/p ESWL with Dr. Erlene Malone 14 days ago for management of a 6 mm proximal right ureteral calculus.  Intraoperative findings notable for smudging of the stone.  Today he reports having passed several punctate "dust" particles that he brings with him to clinic today for analysis.  He reports ongoing right flank discomfort that is tolerable.  He is treating it with ibuprofen 40 mg twice daily.  He is no longer requiring narcotic pain medication or Zofran.  He denies fever, chills, nausea, or vomiting.  KUB today with multiple residual fragments visualized over the proximal right ureter, notable for slight distal migration compared to prior.  Notably, patient reports having completed a metabolic work-up with his previous urologist, Dr. Rodney Malone.  Previous findings notable for hypercalciuria and he has been treating this with indapamide 1.25 mg daily x15 years.  In-office UA today positive for 2+ blood; urine microscopy with 6-10 WBCs/HPF and 11-30 RBCs/HPF.  PMH: Past Medical History:  Diagnosis Date  . Carpal tunnel syndrome    bilateral  . Gallstones 2012  . History of kidney stones   . Hyperlipidemia   . Major depressive disorder, single episode, moderate ([place])   . Obesity   . Sleep apnea    uses a CPAP    Surgical History: Past Surgical History:  Procedure Laterality Date  . CARPAL TUNNEL RELEASE Bilateral 08/24/2014   Procedure: RIGHT LIMITED OPEN CARPAL TUNNEL RELEASE, LEFT CARPAL  TUNNEL INJECTION;  Surgeon: Roseanne Kaufman, MD;  Location: [place];  Service: Orthopedics;  Laterality: Bilateral;  . CARPAL TUNNEL RELEASE Left 06/20/2015   Procedure: LEFT CARPAL TUNNEL RELEASE;  Surgeon: Roseanne Kaufman, MD;  Location: [place];  Service: Orthopedics;  Laterality: Left;  . CHOLECYSTECTOMY  2012  . EXTRACORPOREAL SHOCK WAVE LITHOTRIPSY Right 10/31/2020   Procedure: EXTRACORPOREAL SHOCK WAVE LITHOTRIPSY (ESWL);  Surgeon: Hollice Espy, MD;  Location: [facility];  Service: Urology;  Laterality: Right;  . INGUINAL HERNIA REPAIR Bilateral 1983  . LITHOTRIPSY  12/2003, 09/2006  . TRIGGER FINGER RELEASE     thumb    Home Medications:  Allergies as of 11/14/2020   No Known Allergies     Medication List       Accurate as of November 14, 2020  9:04 AM. If you have any questions, ask your nurse or doctor.        acetaminophen 500 MG tablet Commonly known as: TYLENOL Take 500 mg by mouth every 6 (six) hours as needed.   desvenlafaxine 100 MG 24 hr tablet Commonly known as: PRISTIQ TAKE 1 TABLET BY MOUTH ONCE DAILY   Dexcom G6 Transmitter Misc Check blood sugar as recommended.   fluticasone 50 MCG/ACT nasal spray Commonly known as: FLONASE Place 2 sprays into both nostrils daily.   FreeStyle Libre 2 Reader [place] Use to check blood sugar continuous as instructed.   FreeStyle Libre 2 Sensor Misc Use to check blood sugar continuous as directed.   FREESTYLE TEST STRIPS test strip Generic drug: glucose blood Use as instructed   glucose monitoring kit monitoring kit 1 each by Does not apply route as needed for other.   HYDROcodone-acetaminophen 5-325 MG tablet Commonly known as: NORCO/VICODIN Take 1 tablet by mouth every 6 (six)  hours as needed for moderate pain.   ibuprofen 600 MG tablet Commonly known as: ADVIL Take 600 mg by mouth every 6 (six) hours as needed.   indapamide 1.25 MG tablet Commonly known as: LOZOL TAKE 1 TABLET BY MOUTH EVERY MORNING   ipratropium 0.03 % nasal spray Commonly known as: ATROVENT PLACE 2 SPRAYS INTO BOTH NOSTRILS EVERY 12 HOURS.   ondansetron 4 MG disintegrating tablet Commonly known as: Zofran ODT Take 1 tablet (4 mg total) by mouth every 8 (eight)  hours as needed for nausea or vomiting.   oxyCODONE-acetaminophen 5-325 MG tablet Commonly known as: Percocet Take 1-2 tablets by mouth every 4 (four) hours as needed for moderate pain or severe pain.   promethazine-dextromethorphan 6.25-15 MG/5ML syrup Commonly known as: PROMETHAZINE-DM Take 5 mLs by mouth at bedtime as needed for cough.   propranolol 10 MG tablet Commonly known as: INDERAL TAKE 1 TABLET BY MOUTH ONCE A DAY AS NEEDED   pseudoephedrine 60 MG tablet Commonly known as: SUDAFED Take 60 mg by mouth every 4 (four) hours as needed for congestion.   tamsulosin 0.4 MG Caps capsule Commonly known as: FLOMAX Take 1 capsule (0.4 mg total) by mouth daily.   Turmeric 450 MG Caps Take 450 mg by mouth daily.       Allergies:  No Known Allergies  Family History: Family History  Problem Relation Age of Onset  . Hyperlipidemia Mother   . Hypertension Mother   . Irritable bowel syndrome Mother   . Colon polyps Mother        x lots  . Polycystic ovary syndrome Sister   . Colon cancer Maternal Grandmother        dx in her 81's or 33's  . Lung cancer Maternal Grandmother   . Heart failure Maternal Grandfather   . Heart disease Maternal Grandfather   . Prostate cancer Maternal Grandfather   . Multiple myeloma Paternal Grandfather   . Diabetes Paternal Grandmother   . Colon polyps Maternal Uncle     Social History:   reports that he has never smoked. He has never used smokeless tobacco. He reports current alcohol use. He reports that he does not use drugs.  Physical Exam: BP (!) 147/91   Pulse 91   Ht 5' 7"  (1.702 m)   Wt 239 lb (108.4 kg)   BMI 37.43 kg/m   Constitutional:  Alert and oriented, no acute distress, nontoxic appearing HEENT: [place], AT Cardiovascular: No clubbing, cyanosis, or edema Respiratory: Normal respiratory effort, no increased work of breathing Skin: No rashes, bruises or suspicious lesions Neurologic: Grossly intact, no focal deficits,  moving all 4 extremities Psychiatric: Normal mood and affect  Laboratory Data: Results for orders placed or performed in visit on 11/14/20  Microscopic Examination   Urine  Result Value Ref Range   WBC, UA 6-10 (A) 0 - 5 /hpf   RBC 11-30 (A) 0 - 2 /hpf   Epithelial Cells (non renal) 0-10 0 - 10 /hpf   Bacteria, UA None seen None seen/Few  Urinalysis, Complete  Result Value Ref Range   Specific Gravity, UA 1.020 1.005 - 1.030   pH, UA 7.5 5.0 - 7.5   Color, UA Yellow Yellow   Appearance Ur Clear Clear   Leukocytes,UA Negative Negative   Protein,UA Negative Negative/Trace   Glucose, UA Negative Negative   Ketones, UA Negative Negative   RBC, UA 2+ (A) Negative   Bilirubin, UA Negative Negative   Urobilinogen, Ur 0.2 0.2 -  1.0 mg/dL   Nitrite, UA Negative Negative   Microscopic Examination See below:    Pertinent Imaging: KUB, 11/15/2018: CLINICAL DATA:  Kidney stone follow-up  EXAM: ABDOMEN - 1 VIEW  COMPARISON:  October 31, 2020  FINDINGS: Bilateral renal nephroliths are again identified. There are several calcifications along the expected course of the right ureter which raise suspicion for right-sided ureteral stones  IMPRESSION: 1. Bilateral nephrolithiasis. 2. Multiple calcifications project along the expected course of the right ureter raising suspicion for right-sided ureteral stones. This can be further evaluated with cross-sectional imaging as clinically indicated.   Electronically Signed   By: Constance Holster M.D.   On: 11/14/2020 14:21  I personally reviewed the images referenced above and note interval fragmentation and distal migration of multiple stone fragments, still in the proximal right ureter.  Assessment & Plan:   1. Nephrolithiasis Retained fragments noted on KUB today, pain controllable with ibuprofen.  UA consistent with ongoing stone passage and patient is not clinically infected today with stable vitals.  Counseled patient to  continue Flomax, pushing fluids, and straining urine with plans for repeat KUB and UA in 4 weeks.  Due to his work schedule, patient requests follow-up in 3 weeks to coincide with his previously scheduled vasectomy consult with Dr. Erlene Malone.  I am in agreement with this plan.  We discussed return precautions today including fever, chills, uncontrollable pain, and uncontrollable nausea/vomiting.  He expressed understanding.  Recommend repeat metabolic work-up once he is at least 30 days out from passing his residual fragments to ensure he is medically optimized for stone prevention.  We also discussed maintaining moderate dietary calcium, 1000-1200 mg daily, for stone prevention. - Urinalysis, Complete - tamsulosin (FLOMAX) 0.4 MG CAPS capsule; Take 1 capsule (0.4 mg total) by mouth daily.  Dispense: 30 capsule; Refill: 0 - DG Abd 1 View; Future   Return in about 3 weeks (around 12/05/2020) for Vasectomy consult and stone f/u with UA + KUB prior with Dr. Erlene Malone.  Debroah Loop, PA-C  [facility] [address], [place], [place] [address] [phone]

## 2020-11-19 LAB — CALCULI, WITH PHOTOGRAPH (CLINICAL LAB)
Calcium Oxalate Dihydrate: 90 %
Hydroxyapatite: 10 %
Weight Calculi: 2 mg

## 2020-11-24 ENCOUNTER — Emergency Department
Admission: EM | Admit: 2020-11-24 | Discharge: 2020-11-24 | Disposition: A | Discharge status: Home / Self Care | Payer: 59 | Attending: Emergency Medicine | Admitting: Emergency Medicine

## 2020-11-24 ENCOUNTER — Emergency Department: Payer: 59

## 2020-11-24 ENCOUNTER — Other Ambulatory Visit: Payer: Self-pay

## 2020-11-24 DIAGNOSIS — N200 Calculus of kidney: Secondary | ICD-10-CM

## 2020-11-24 DIAGNOSIS — N281 Cyst of kidney, acquired: Secondary | ICD-10-CM | POA: Diagnosis not present

## 2020-11-24 DIAGNOSIS — R109 Unspecified abdominal pain: Secondary | ICD-10-CM | POA: Diagnosis present

## 2020-11-24 DIAGNOSIS — N202 Calculus of kidney with calculus of ureter: Secondary | ICD-10-CM | POA: Diagnosis not present

## 2020-11-24 DIAGNOSIS — N130 Hydronephrosis with ureteropelvic junction obstruction: Secondary | ICD-10-CM | POA: Diagnosis not present

## 2020-11-24 DIAGNOSIS — N132 Hydronephrosis with renal and ureteral calculous obstruction: Secondary | ICD-10-CM | POA: Diagnosis not present

## 2020-11-24 DIAGNOSIS — K760 Fatty (change of) liver, not elsewhere classified: Secondary | ICD-10-CM | POA: Diagnosis not present

## 2020-11-24 LAB — COMPREHENSIVE METABOLIC PANEL
ALT: 52 U/L — ABNORMAL HIGH (ref 0–44)
AST: 31 U/L (ref 15–41)
Albumin: 4.4 g/dL (ref 3.5–5.0)
Alkaline Phosphatase: 49 U/L (ref 38–126)
Anion gap: 12 (ref 5–15)
BUN: 21 mg/dL — ABNORMAL HIGH (ref 6–20)
CO2: 31 mmol/L (ref 22–32)
Calcium: 10 mg/dL (ref 8.9–10.3)
Chloride: 97 mmol/L — ABNORMAL LOW (ref 98–111)
Creatinine, Ser: 1.13 mg/dL (ref 0.61–1.24)
GFR, Estimated: >60 mL/min (ref >60)
Glucose, Bld: 143 mg/dL — ABNORMAL HIGH (ref 70–99)
Potassium: 4.1 mmol/L (ref 3.5–5.1)
Sodium: 140 mmol/L (ref 135–145)
Total Bilirubin: 0.5 mg/dL (ref 0.3–1.2)
Total Protein: 7.5 g/dL (ref 6.5–8.1)

## 2020-11-24 LAB — LIPASE, BLOOD: Lipase: 41 U/L (ref 11–51)

## 2020-11-24 LAB — CBC
HCT: 40.7 % (ref 39.0–52.0)
Hemoglobin: 14.1 g/dL (ref 13.0–17.0)
MCH: 32.6 pg (ref 26.0–34.0)
MCHC: 34.6 g/dL (ref 30.0–36.0)
MCV: 94 fL (ref 80.0–100.0)
Platelets: 354 10*3/uL (ref 150–400)
RBC: 4.33 MIL/uL (ref 4.22–5.81)
RDW: 12 % (ref 11.5–15.5)
WBC: 10.9 10*3/uL — ABNORMAL HIGH (ref 4.0–10.5)
nRBC: 0 % (ref 0.0–0.2)

## 2020-11-24 LAB — URINALYSIS, COMPLETE (UACMP) WITH MICROSCOPIC
Bilirubin Urine: NEGATIVE
Glucose, UA: NEGATIVE mg/dL
Ketones, ur: NEGATIVE mg/dL
Leukocytes,Ua: NEGATIVE
Nitrite: NEGATIVE
Protein, ur: NEGATIVE mg/dL
RBC / HPF: >50 RBC/hpf — ABNORMAL HIGH (ref 0–5)
Specific Gravity, Urine: 1.017 (ref 1.005–1.030)
pH: 8 (ref 5.0–8.0)

## 2020-11-24 MED ORDER — OXYCODONE-ACETAMINOPHEN 5-325 MG PO TABS
1.0000 | ORAL_TABLET | Freq: Four times a day (QID) | ORAL | 0 refills | Status: DC | PRN
  Filled 2020-11-24: qty 10, 2d supply, fill #0

## 2020-11-24 MED ORDER — SODIUM CHLORIDE 0.9 % IV BOLUS
1000.0000 mL | Freq: Once | INTRAVENOUS | Status: AC
  Administered 2020-11-24: 1000 mL via INTRAVENOUS

## 2020-11-24 MED ORDER — MORPHINE SULFATE (PF) 4 MG/ML IV SOLN: 4.0000 mg | Freq: Once | INTRAVENOUS | Status: AC

## 2020-11-24 MED ORDER — MORPHINE SULFATE (PF) 4 MG/ML IV SOLN
4.0000 mg | Freq: Once | INTRAVENOUS | Status: AC
  Administered 2020-11-24: 4 mg via INTRAVENOUS
  Filled 2020-11-24: qty 1

## 2020-11-24 MED ORDER — ONDANSETRON HCL 4 MG/2ML IJ SOLN
4.0000 mg | INTRAMUSCULAR | Status: AC
  Administered 2020-11-24: 4 mg via INTRAVENOUS
  Filled 2020-11-24: qty 2

## 2020-11-24 MED ORDER — MORPHINE SULFATE (PF) 4 MG/ML IV SOLN
INTRAVENOUS | Status: AC
  Administered 2020-11-24: 4 mg via INTRAVENOUS
  Filled 2020-11-24: qty 1

## 2020-11-24 MED FILL — Tamsulosin HCl Cap 0.4 MG: ORAL | 30 days supply | Qty: 30 | Fill #0 | Status: AC

## 2020-11-24 NOTE — ED Notes (Signed)
Report to [employer], RN now assuming care

## 2020-11-24 NOTE — ED Triage Notes (Signed)
Pt presents to ER c/o dysuria x6 hours.  Pt states he had lithotripsy around 3 weeks ago, but the urinary retention began tonight.  Pt states he has urinated some, but it has only been small amounts.

## 2020-11-24 NOTE — Discharge Instructions (Addendum)
You have been seen in the Emergency Department (ED) today for pain that we believe based on your workup, is caused by kidney stones.  As we have discussed, please drink plenty of fluids.  Please make a follow up appointment with the physician(s) listed elsewhere in this documentation.  You may take pain medication as needed but ONLY as prescribed.  Please also take your prescribed Flomax daily.    Do not drink alcohol, drive or participate in any other potentially dangerous activities while taking opiate pain medication as it may make you sleepy. Do not take this medication with any other sedating medications, either prescription or over-the-counter. If you were prescribed Percocet or Vicodin, do not take these with acetaminophen (Tylenol) as it is already contained within these medications.   This medication is an opiate (or narcotic) pain medication and can be habit forming.  Use it as little as possible to achieve adequate pain control.  Do not use or use it with extreme caution if you have a history of opiate abuse or dependence.  If you are on a pain contract with your primary care doctor or a pain specialist, be sure to let them know you were prescribed this medication today from the [facility] Emergency Department.  This medication is intended for your use only - do not give any to anyone else and keep it in a secure place where nobody else, especially children, have access to it.  It will also cause or worsen constipation, so you may want to consider taking an over-the-counter stool softener while you are taking this medication.  Return to the Emergency Department (ED) or call your doctor if you have any worsening pain, fever, painful urination, are unable to urinate, or develop other symptoms that concern you.

## 2020-11-24 NOTE — ED Notes (Signed)
Pt has voided 10cc cloudy yellow urine; no gross hematuria -- will send to lab.  After voiding pt began vomiting again-- will notify Dr Jacqualine Code

## 2020-11-24 NOTE — ED Notes (Signed)
Late entry- Pt requesting pain med at this time-- reports 7/10 dull/ache pain to L flank and 7/10 sharp pain to lower abdomen -- nausea controlled at this time after IVP Zofran.  1L NS bolus continues to infuse to 20G R AC; line is patent and no signs of redness, tenderness or infiltration.  Pt remains awake and alert with spouse at bedside awaiting CT renal stone study results.  Will continue to monitor for acute changes and maintain plan of care.

## 2020-11-24 NOTE — ED Provider Notes (Addendum)
[facility] Emergency Department Provider Note   ____________________________________________   Event Date/Time   First MD Initiated Contact with Patient 11/24/20 [phone]     (approximate)  I have reviewed the triage vital signs and the nursing notes.   HISTORY  Chief Complaint Dysuria    HPI Aaron Malone is a 45 y.o. male the history of multiple and recurrent kidney stones, recent lithotripsy.  Patient reports that he was in his normal state of health, this evening he woke up from sleep in the early morning with left flank pain.  He felt like this felt like a possible kidney stone, he took a pain medication and antiemetic at home, and thereafter he is felt a severe urgency to urinate.  He reports it feels like his bladder is full.  He is also experiencing a somewhat sharp pain in his left lower pelvis left lower back that feels similar to previous kidney stones.  Mild nausea but pain is relatively well controlled now.  Does not wish for additional pain medication at this time  He has been feeling well recovering from recent lithotripsy with urology.  He continues on Flomax.  No chest pain no fever no trouble breathing.  Has not noticed any irregularity to his urine other than severe urgency to urinate at this time.  Has had previous lithotripsies.   Past Medical History:  Diagnosis Date  . Carpal tunnel syndrome    bilateral  . Gallstones 2012  . History of kidney stones   . Hyperlipidemia   . Major depressive disorder, single episode, moderate ([place])   . Obesity   . Sleep apnea    uses a CPAP    Patient Active Problem List   Diagnosis Date Noted  . Vitamin D deficiency 04/22/2020  . Situational anxiety 07/31/2016  . Nephrolithiasis 07/31/2016  . Obstructive sleep apnea 07/29/2016  . Premature ejaculation 05/03/2012  . Pulmonary granuloma ([place]) 11/08/2010  . Major depressive disorder, recurrent, moderate ([place]) 06/13/2010  . PURE  HYPERCHOLESTEROLEMIA 05/30/2010  . Seasonal and perennial allergic rhinitis 11/23/2008  . NEPHROLITHIASIS, HX OF 06/15/2007    Past Surgical History:  Procedure Laterality Date  . CARPAL TUNNEL RELEASE Bilateral 08/24/2014   Procedure: RIGHT LIMITED OPEN CARPAL TUNNEL RELEASE, LEFT CARPAL  TUNNEL INJECTION;  Surgeon: Roseanne Kaufman, MD;  Location: [place];  Service: Orthopedics;  Laterality: Bilateral;  . CARPAL TUNNEL RELEASE Left 06/20/2015   Procedure: LEFT CARPAL TUNNEL RELEASE;  Surgeon: Roseanne Kaufman, MD;  Location: [place];  Service: Orthopedics;  Laterality: Left;  . CHOLECYSTECTOMY  2012  . EXTRACORPOREAL SHOCK WAVE LITHOTRIPSY Right 10/31/2020   Procedure: EXTRACORPOREAL SHOCK WAVE LITHOTRIPSY (ESWL);  Surgeon: Hollice Espy, MD;  Location: [facility];  Service: Urology;  Laterality: Right;  . INGUINAL HERNIA REPAIR Bilateral 1983  . LITHOTRIPSY  12/2003, 09/2006  . TRIGGER FINGER RELEASE     thumb    Prior to Admission medications   Medication Sig Start Date End Date Taking? Authorizing Provider  oxyCODONE-acetaminophen (PERCOCET/ROXICET) 5-325 MG tablet Take 1-2 tablets by mouth every 6 (six) hours as needed for severe pain. 11/24/20  Yes Delman Kitten, MD  acetaminophen (TYLENOL) 500 MG tablet Take 500 mg by mouth every 6 (six) hours as needed.    [provider]  Continuous Blood Gluc Receiver (FREESTYLE LIBRE 2 READER) DEVI Use to check blood sugar continuous as instructed. 06/07/20   Jinny Sanders, MD  Continuous Blood Gluc Sensor (FREESTYLE LIBRE 2  SENSOR) MISC USE TO CHECK BLOOD SUGAR CONTINUOUS AS DIRECTED. 06/07/20 06/07/21  Bedsole, Amy E, MD  Continuous Blood Gluc Transmit (DEXCOM G6 TRANSMITTER) MISC Check blood sugar as recommended. 12/08/19   Jinny Sanders, MD  COVID-19 mRNA vaccine, Moderna, 100 MCG/0.5ML injection USE AS DIRECTED 08/28/20 08/28/21  Carlyle Basques, MD  desvenlafaxine (PRISTIQ) 100 MG 24 hr tablet TAKE 1 TABLET  BY MOUTH ONCE DAILY 09/16/20   Bedsole, Amy E, MD  fluticasone (FLONASE) 50 MCG/ACT nasal spray USE 2 SPRAYS IN [facility] NOSTIL ONCE A DAY 11/11/20 11/11/21  Bedsole, Amy E, MD  glucose blood (FREESTYLE TEST STRIPS) test strip Use as instructed 12/12/19   Bedsole, Amy E, MD  glucose monitoring kit (FREESTYLE) monitoring kit 1 each by Does not apply route as needed for other. 12/12/19   Bedsole, Amy E, MD  ibuprofen (ADVIL,MOTRIN) 600 MG tablet Take 600 mg by mouth every 6 (six) hours as needed.    [provider]  indapamide (LOZOL) 1.25 MG tablet TAKE 1 TABLET BY MOUTH EVERY MORNING 09/23/20 09/23/21  Bedsole, Amy E, MD  ipratropium (ATROVENT) 0.03 % nasal spray PLACE 2 SPRAYS INTO BOTH NOSTRILS EVERY 12 HOURS. 10/31/19   Copland, Frederico Hamman, MD  ondansetron (ZOFRAN-ODT) 4 MG disintegrating tablet DISSOLVE 1 TABLET BY MOUTH EVERY 8 HOURS AS NEEDED FOR NAUSEA AND/OR VOMITING Patient not taking: Reported on 11/14/2020 10/29/20 10/29/21  Hollice Espy, MD  promethazine-dextromethorphan (PROMETHAZINE-DM) 6.25-15 MG/5ML syrup TAKE 5 MLS BY MOUTH AT BEDTIME AS NEEDED FOR COUGH. Patient not taking: Reported on 11/14/2020 09/03/20 09/03/21  Jinny Sanders, MD  propranolol (INDERAL) 10 MG tablet TAKE 1 TABLET BY MOUTH ONCE DAILY AS NEEDED 06/10/20 06/10/21  Jinny Sanders, MD  pseudoephedrine (SUDAFED) 60 MG tablet Take 60 mg by mouth every 4 (four) hours as needed for congestion.    [provider]  tamsulosin (FLOMAX) 0.4 MG CAPS capsule TAKE 1 CAPSULE (0.4 MG TOTAL) BY MOUTH DAILY. 11/14/20 11/14/21  Debroah Loop, PA-C  Turmeric 450 MG CAPS Take 450 mg by mouth daily.    [provider]    Allergies Patient has no known allergies.  Family History  Problem Relation Age of Onset  . Hyperlipidemia Mother   . Hypertension Mother   . Irritable bowel syndrome Mother   . Colon polyps Mother        x lots  . Polycystic ovary syndrome Sister   . Colon cancer Maternal Grandmother         dx in her 49's or 69's  . Lung cancer Maternal Grandmother   . Heart failure Maternal Grandfather   . Heart disease Maternal Grandfather   . Prostate cancer Maternal Grandfather   . Multiple myeloma Paternal Grandfather   . Diabetes Paternal Grandmother   . Colon polyps Maternal Uncle     Social History Social History   Tobacco Use  . Smoking status: Never Smoker  . Smokeless tobacco: Never Used  Vaping Use  . Vaping Use: Never used  Substance Use Topics  . Alcohol use: Yes    Comment: occasionally  . Drug use: No    Review of Systems Constitutional: No fever/chills Eyes: No visual changes. Cardiovascular: Denies chest pain. Respiratory: Denies shortness of breath. Gastrointestinal: No abdominal pain.  Her other reports more of a left flank discomfort and also left lower back. Genitourinary: Negative for dysuria.  Reports significant urge to urinate feels like he cannot empty his bladder. Musculoskeletal: Negative for back pain except as noted on  left. Skin: Negative for rash.  Denies any scrotal or penile pain.  Neurological: Negative for headaches, areas of focal weakness or numbness.    ____________________________________________   PHYSICAL EXAM:  VITAL SIGNS: ED Triage Vitals  Enc Vitals Group     BP 11/24/20 0520 (!) 154/114     Pulse Rate 11/24/20 0520 68     Resp 11/24/20 0520 18     Temp 11/24/20 0520 97.8 F (36.6 C)     Temp Source 11/24/20 0520 Oral     SpO2 11/24/20 0520 97 %     Weight 11/24/20 0521 230 lb (104.3 kg)     Height 11/24/20 0521 5' 7"  (1.702 m)     Head Circumference --      Peak Flow --      Pain Score 11/24/20 0521 4     Pain Loc --      Pain Edu? --      Excl. in [place]? --   Blood pressure elevated I suspect secondary to pain  Constitutional: Alert and oriented. Well appearing and in no acute distress.  Ambulatory in room from bathroom back.  He shows a small about 5 mL of clear urine, but reports urgency to urinate. Eyes:  Conjunctivae are normal. Head: Atraumatic. Nose: No congestion/rhinnorhea. Mouth/Throat: Mucous membranes are moist. Neck: No stridor.  Cardiovascular: Normal rate, regular rhythm. Grossly normal heart sounds.  Good peripheral circulation. Respiratory: Normal respiratory effort.  No retractions. Lungs CTAB. Gastrointestinal: Soft and nontender. No distention. Musculoskeletal: No lower extremity tenderness nor edema. Neurologic:  Normal speech and language. No gross focal neurologic deficits are appreciated.  Skin:  Skin is warm, dry and intact. No rash noted. Psychiatric: Mood and affect are normal. Speech and behavior are normal.  ____________________________________________   LABS (all labs ordered are listed, but only abnormal results are displayed)  Labs Reviewed  CBC - Abnormal; Notable for the following components:      Result Value   WBC 10.9 (*)    All other components within normal limits  COMPREHENSIVE METABOLIC PANEL - Abnormal; Notable for the following components:   Chloride 97 (*)    Glucose, Bld 143 (*)    BUN 21 (*)    ALT 52 (*)    All other components within normal limits  URINALYSIS, COMPLETE (UACMP) WITH MICROSCOPIC - Abnormal; Notable for the following components:   Color, Urine YELLOW (*)    APPearance TURBID (*)    Hgb urine dipstick MODERATE (*)    RBC / HPF >50 (*)    Bacteria, UA RARE (*)    All other components within normal limits  URINE CULTURE  LIPASE, BLOOD   ____________________________________________  EKG   ____________________________________________  RADIOLOGY  CT Renal Stone Study  Result Date: 11/24/2020 CLINICAL DATA:  Flank pain with dysuria for 6 hours. History of lithotripsy 3 weeks ago. EXAM: CT ABDOMEN AND PELVIS WITHOUT CONTRAST TECHNIQUE: Multidetector CT imaging of the abdomen and pelvis was performed following the standard protocol without IV contrast. COMPARISON:  01/01/2016 FINDINGS: Lower chest:  Calcified pulmonary  and thoracic nodal granulomas. Hepatobiliary: Steatosis of the liver. No focal abnormality.Cholecystectomy. Pancreas: Unremarkable. Spleen: Unremarkable. Adrenals/Urinary Tract: Negative adrenals. Long-standing lesion from the lower pole right kidney with cystic density in 2012 and homogeneous high-density in 2017. There has been enlargement since priors, now 5.6 cm with high-density and peripheral stranding. Additional nodule exophytic from the upper pole is low-density right kidney. 2.4 cm cystic density at the upper pole left  kidney. 3 mm left UVJ calculus with mild hydroureteronephrosis. No right hydronephrosis but there are 3 mid ureteral calculi clustered at the level of L3/4 and individually measuring 3 mm. Bilateral nephrolithiasis numbering at least 2 on the left and 3 on the right. The interpolar calculus on the right is largest and measures 4 mm. Stomach/Bowel:  No obstruction. No visible bowel inflammation. Vascular/Lymphatic: No acute vascular abnormality. No mass or adenopathy. Reproductive:Nonspecific prostate calcification. Other: No ascites or pneumoperitoneum. Musculoskeletal: No acute abnormalities. IMPRESSION: 1. Mild left hydroureteronephrosis from a 3 mm UVJ calculus. 2. 3 mid right ureteral calculi individually measuring up to 3 mm. No associated hydronephrosis. 3. Chronic hemorrhagic cyst at the lower pole right kidney when compared with priors. There has been enlargement and adjacent fat stranding suggesting recent intracystic hemorrhage. Recommend renal MRI with contrast after convalescence. 4. Bilateral nephrolithiasis. 5. Hepatic steatosis. Electronically Signed   By: Monte Fantasia M.D.   On: 11/24/2020 06:28    CT imaging reviewed.  Positive for mild left hydronephrosis 3 mm UVJ calculus.  Also noted are right ureteral calculi without hydronephrosis.  Also noted are hemorrhagic cystic component of the right kidney.  Findings and CT scan results as well as labs, presentation and plan  of care discussed and developed in conjunction with Dr. Windell Norfolk of urology. ____________________________________________   PROCEDURES  Procedure(s) performed: None  Procedures  Critical Care performed: No  ____________________________________________   INITIAL IMPRESSION / ASSESSMENT AND PLAN / ED COURSE  Pertinent labs & imaging results that were available during my care of the patient were reviewed by me and considered in my medical decision making (see chart for details).   Differential diagnosis includes but is not limited to, abdominal perforation, aortic dissection, cholecystitis, appendicitis, diverticulitis, colitis, esophagitis/gastritis, kidney stone, pyelonephritis, urinary tract infection, aortic aneurysm. All are considered in decision and treatment plan. Based upon the patient's presentation and risk factors, and his chief complaint primarily around left flank pain and feeling of urinary retention/urgency to void.  I suspect based on my evaluation that patient is likely having recurrent nephrolithiasis possibly on the left side this time.  Discussed with the patient reports been several years since he had a CT scan of the abdomen.  We will proceed with CT stone study.  Hydrate, check labs, check creatinine, UA etc.  No obvious systemic symptoms of infection.  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  Will provide additional antiemetic as well  Bedside quick fast ultrasound performed by me demonstrates left-sided hydronephrosis, and the bladder appears decompressed to bedside ultrasound at 0530am. No free fluid.    ----------------------------------------- 7:28 AM on 11/24/2020 -----------------------------------------  Ongoing care assigned to Dr. Cheri Fowler.  Follow-up on reassessment of pain, symptomatology, and evaluation for voiding/postvoid residual.  If well controlled,  anticipate discharge to follow-up closely with urology.  Prescription for Percocet already given.  Patient has adequate Zofran and Flomax at home already  Have discussed results with urology, Dr. Abner Greenspan.  Recommends close outpatient follow-up and will notify [facility] urology.  I will prescribe the patient a narcotic pain medicine due to their condition which I anticipate will cause at least moderate pain short term. I discussed with the patient safe use of narcotic pain medicines, and that they are not to drive, work in dangerous areas, or ever take more than prescribed (no more than 1-2 pill every 6 hours). We discussed  that this is the type of medication that can be  overdosed on and the risks of this type of medicine. Patient is very agreeable to only use as prescribed and to never use more than prescribed. Patient is a Software engineer as well, both he and his wife voiced understanding           ____________________________________________   FINAL CLINICAL IMPRESSION(S) / ED DIAGNOSES  Final diagnoses:  Kidney stone on left side        Note:  This document was prepared using Dragon voice recognition software and may include unintentional dictation errors       Delman Kitten, MD 11/24/20 Wonda Amis    Delman Kitten, MD 11/24/20 [id number]

## 2020-11-24 NOTE — ED Notes (Signed)
Pt now returned from [place]

## 2020-11-24 NOTE — ED Notes (Signed)
Patient transported to CT 

## 2020-11-25 ENCOUNTER — Other Ambulatory Visit (HOSPITAL_COMMUNITY): Payer: Self-pay

## 2020-11-25 ENCOUNTER — Other Ambulatory Visit: Payer: Self-pay

## 2020-11-25 LAB — URINE CULTURE: Culture: NO GROWTH

## 2020-11-29 ENCOUNTER — Other Ambulatory Visit (HOSPITAL_COMMUNITY): Payer: Self-pay

## 2020-12-03 NOTE — Progress Notes (Signed)
12/04/2020 8:37 AM   Aaron Malone [birth year] [id number]  Referring provider: Jinny Sanders, MD [address] [place],  [place] [address]  Chief Complaint  Patient presents with  . VAS Consult  . Nephrolithiasis    HPI: Aaron Malone is a 45 y.o. year old male with a history of recurrent nephrolithiasis, who returns today for a stone follow up and a vasectomy consult.  He had a recent ESWL from [facility] on 10/31/2020, when a 86m proximal right ureteral calculus was resolved. He presents today for vasectomy counseling and a stone follow up with a KUB and UA prior. [facility] for a left renal stone with a chief complaint of dysuria/concern for incomplete bladder emptying.  Urinalysis at that time showed multiple red blood cells, a few white blood cells, nitrate negative.  Urine culture was negative.  CT abdomen pelvis without contrast showed mild left hydroureteronephrosis with a 3 mm left UVJ calculus.  He also had 3 individual mid right ureteral calculi measuring up to 3 mm without associated hydronephrosis.  Incidental finding including a hemorrhagic cyst on the right lower pole.  Patient reports that he has been straining some since the ER, but has not noticed passing any stones. He reports right flank pain, and urgency. He had an issue after the ER visit when he could not void, and thought the he might need a catheter, but he didn't. He said that he's had small stones on both sides. Patient reports some "twinges" but nothing major.  Expressed that he does want a vasectomy and will further discuss when before ureteroscopy procedure. He asked how long he would need to recover from the vasectomy. SMichela Pitcherit might be easier to do it in July because of work.  Patient asked if his cyst is anything to worry about.  He mentions that he was told about this cyst dating back to 2005.   PMH: Past Medical History:  Diagnosis Date  . Carpal  tunnel syndrome    bilateral  . Gallstones 2012  . History of kidney stones   . Hyperlipidemia   . Major depressive disorder, single episode, moderate ([facility]   . Obesity   . Sleep apnea    uses a CPAP    Surgical History: Past Surgical History:  Procedure Laterality Date  . CARPAL TUNNEL RELEASE Bilateral 08/24/2014   Procedure: RIGHT LIMITED OPEN CARPAL TUNNEL RELEASE, LEFT CARPAL  TUNNEL INJECTION;  Surgeon: WRoseanne Kaufman MD;  [facility]  Service: Orthopedics;  Laterality: Bilateral;  . CARPAL TUNNEL RELEASE Left 06/20/2015   Procedure: LEFT CARPAL TUNNEL RELEASE;  Surgeon: WRoseanne Kaufman MD;  [facility]  Service: Orthopedics;  Laterality: Left;  . CHOLECYSTECTOMY  2012  . EXTRACORPOREAL SHOCK WAVE LITHOTRIPSY Right 10/31/2020   Procedure: EXTRACORPOREAL SHOCK WAVE LITHOTRIPSY (ESWL);  Surgeon: BHollice Espy MD;  Location: [facility];  Service: Urology;  Laterality: Right;  . INGUINAL HERNIA REPAIR Bilateral 1983  . LITHOTRIPSY  12/2003, 09/2006  . TRIGGER FINGER RELEASE     thumb    Home Medications:  Allergies as of 12/04/2020   No Known Allergies     Medication List       Accurate as of December 04, 2020  8:37 AM. If you have any questions, ask your nurse or doctor.        STOP taking these medications   promethazine-dextromethorphan 6.25-15 MG/5ML syrup Commonly known as:  PROMETHAZINE-DM Stopped by: Hollice Espy, MD     TAKE these medications   acetaminophen 500 MG tablet Commonly known as: TYLENOL Take 500 mg by mouth every 6 (six) hours as needed.   desvenlafaxine 100 MG 24 hr tablet Commonly known as: PRISTIQ TAKE 1 TABLET BY MOUTH ONCE DAILY   Dexcom G6 Transmitter Misc Check blood sugar as recommended.   fluticasone 50 MCG/ACT nasal spray Commonly known as: FLONASE USE 2 SPRAYS IN EACH NOSTIL ONCE A DAY   FreeStyle Libre 2 Reader Devi Use to check blood sugar continuous as instructed.   FreeStyle  Libre 2 Sensor Misc USE TO CHECK BLOOD SUGAR CONTINUOUS AS DIRECTED.   FREESTYLE TEST STRIPS test strip Generic drug: glucose blood Use as instructed   glucose monitoring kit monitoring kit 1 each by Does not apply route as needed for other.   ibuprofen 600 MG tablet Commonly known as: ADVIL Take 600 mg by mouth every 6 (six) hours as needed.   indapamide 1.25 MG tablet Commonly known as: LOZOL TAKE 1 TABLET BY MOUTH EVERY MORNING   ipratropium 0.03 % nasal spray Commonly known as: ATROVENT PLACE 2 SPRAYS INTO BOTH NOSTRILS EVERY 12 HOURS.   Moderna COVID-19 Vaccine 100 MCG/0.5ML injection Generic drug: COVID-19 mRNA vaccine (Moderna) USE AS DIRECTED   ondansetron 4 MG disintegrating tablet Commonly known as: ZOFRAN-ODT DISSOLVE 1 TABLET BY MOUTH EVERY 8 HOURS AS NEEDED FOR NAUSEA AND/OR VOMITING   oxyCODONE-acetaminophen 5-325 MG tablet Commonly known as: PERCOCET/ROXICET Take 1-2 tablets by mouth every 6 (six) hours as needed for severe pain.   propranolol 10 MG tablet Commonly known as: INDERAL TAKE 1 TABLET BY MOUTH ONCE DAILY AS NEEDED   pseudoephedrine 60 MG tablet Commonly known as: SUDAFED Take 60 mg by mouth every 4 (four) hours as needed for congestion.   tamsulosin 0.4 MG Caps capsule Commonly known as: FLOMAX TAKE 1 CAPSULE (0.4 MG TOTAL) BY MOUTH DAILY.   Turmeric 450 MG Caps Take 450 mg by mouth daily.       Allergies: No Known Allergies  Family History: Family History  Problem Relation Age of Onset  . Hyperlipidemia Mother   . Hypertension Mother   . Irritable bowel syndrome Mother   . Colon polyps Mother        x lots  . Polycystic ovary syndrome Sister   . Colon cancer Maternal Grandmother        dx in her 46's or 34's  . Lung cancer Maternal Grandmother   . Heart failure Maternal Grandfather   . Heart disease Maternal Grandfather   . Prostate cancer Maternal Grandfather   . Multiple myeloma Paternal Grandfather   . Diabetes  Paternal Grandmother   . Colon polyps Maternal Uncle     Social History:  reports that he has never smoked. He has never used smokeless tobacco. He reports current alcohol use. He reports that he does not use drugs.   Physical Exam: BP (!) 143/88   Pulse 88   Ht 5' 7"  (1.702 m)   Wt 230 lb (104.3 kg)   BMI 36.02 kg/m   Constitutional:  Alert and oriented, No acute distress. HEENT: [place] AT, moist mucus membranes.  Trachea midline, no masses. Cardiovascular: No clubbing, cyanosis, or edema. Respiratory: Normal respiratory effort, no increased work of breathing. Skin: No rashes, bruises or suspicious lesions. Neurologic: Grossly intact, no focal deficits, moving all 4 extremities. Psychiatric: Normal mood and affect.  Urinalysis Urinalysis today with greater than 50 red  blood cells, otherwise unremarkable   Pertinent Imaging:  CLINICAL DATA:  Flank pain with dysuria for 6 hours. History of lithotripsy 3 weeks ago.  EXAM: CT ABDOMEN AND PELVIS WITHOUT CONTRAST  TECHNIQUE: Multidetector CT imaging of the abdomen and pelvis was performed following the standard protocol without IV contrast.  COMPARISON:  01/01/2016  FINDINGS: Lower chest:  Calcified pulmonary and thoracic nodal granulomas.  Hepatobiliary: Steatosis of the liver. No focal abnormality.Cholecystectomy.  Pancreas: Unremarkable.  Spleen: Unremarkable.  Adrenals/Urinary Tract: Negative adrenals. Long-standing lesion from the lower pole right kidney with cystic density in 2012 and homogeneous high-density in 2017. There has been enlargement since priors, now 5.6 cm with high-density and peripheral stranding. Additional nodule exophytic from the upper pole is low-density right kidney. 2.4 cm cystic density at the upper pole left kidney. 3 mm left UVJ calculus with mild hydroureteronephrosis. No right hydronephrosis but there are 3 mid ureteral calculi clustered at the level of L3/4 and individually measuring  3 mm. Bilateral nephrolithiasis numbering at least 2 on the left and 3 on the right. The interpolar calculus on the right is largest and measures 4 mm.  Stomach/Bowel:  No obstruction. No visible bowel inflammation.  Vascular/Lymphatic: No acute vascular abnormality. No mass or adenopathy.  Reproductive:Nonspecific prostate calcification.  Other: No ascites or pneumoperitoneum.  Musculoskeletal: No acute abnormalities.  IMPRESSION: 1. Mild left hydroureteronephrosis from a 3 mm UVJ calculus. 2. 3 mid right ureteral calculi individually measuring up to 3 mm. No associated hydronephrosis. 3. Chronic hemorrhagic cyst at the lower pole right kidney when compared with priors. There has been enlargement and adjacent fat stranding suggesting recent intracystic hemorrhage. Recommend renal MRI with contrast after convalescence. 4. Bilateral nephrolithiasis. 5. Hepatic steatosis.   Electronically Signed   By: Monte Fantasia M.D.   On: 11/24/2020 06:28   I have personally reviewed the images and agree with radiologist interpretation.  He also had a KUB today which was fairly low quality.  I was able to see that nonobstructing stones on the right but ureteral calculi were not visualized.  Assessment & Plan:    1. Nephrolithiasis Retained right ureteral calculi x3 status post ESWL on most recent CT scan however nonobstructing and currently asymptomatic  KUB today does not clearly show the stones however based on previous CT scan as well as persistence of microscopic blood, suspect is retained  We discussed that given that he is failed to pass the spontaneously, would strongly consider ureteroscopic intervention to clear the stones as well as treat all of his upper tract stone as well at the same time.  Risks and benefits of ureteroscopy were reviewed including but not limited to infection, bleeding, pain, ureteral injury which could require open surgery versus prolonged indwelling if  ureteral perforation occurs, persistent stone disease, requirement for staged procedure, possible stent, and global anesthesia risks. Patient expressed understanding and desires to proceed with ureteroscopy.  Also plan to do a left retrograde pyelogram to ensure that he is passed his left punctate ureteral calculus although given lack of symptoms, likely has done so  2. Renal cyst Seen on previous imaging, likely benign Will evaluate with RUS following ureteroscopy, pending on appearance, no consider MRI depending on renal ultrasound results Also discussed it may be helpful to have previous imaging for comparison, assess need for more advanced imaging  3. Undesired fertility Desires to schedule vasectomy, worried about recovery, will defer this until July in the office.   Ardyth Gal  I, Ardyth Gal, am  acting as a scribe for Dr. Hollice Espy.   I have reviewed the above documentation for accuracy and completeness, and I agree with the above.   Hollice Espy, MD   [facility] [address], [place], [place] [address] [phone]

## 2020-12-03 NOTE — H&P (View-Only) (Signed)
12/04/2020 8:37 AM   Aaron Malone 05-Jun-1976 [id number]  Referring provider: Jinny Sanders, MD [address] [place],  [place] [address]  Chief Complaint  Patient presents with  . VAS Consult  . Nephrolithiasis    HPI: Aaron Malone is a 45 y.o. year old male with a history of recurrent nephrolithiasis, who returns today for a stone follow up and a vasectomy consult.  He had a recent ESWL from [facility] on 10/31/2020, when a 70m proximal right ureteral calculus was resolved. He presents today for vasectomy counseling and a stone follow up with a KUB and UA prior. [facility] for a left renal stone with a chief complaint of dysuria/concern for incomplete bladder emptying.  Urinalysis at that time showed multiple red blood cells, a few white blood cells, nitrate negative.  Urine culture was negative.  CT abdomen pelvis without contrast showed mild left hydroureteronephrosis with a 3 mm left UVJ calculus.  He also had 3 individual mid right ureteral calculi measuring up to 3 mm without associated hydronephrosis.  Incidental finding including a hemorrhagic cyst on the right lower pole.  Patient reports that he has been straining some since the ER, but has not noticed passing any stones. He reports right flank pain, and urgency. He had an issue after the ER visit when he could not void, and thought the he might need a catheter, but he didn't. He said that he's had small stones on both sides. Patient reports some "twinges" but nothing major.  Expressed that he does want a vasectomy and will further discuss when before ureteroscopy procedure. He asked how long he would need to recover from the vasectomy. SMichela Pitcherit might be easier to do it in July because of work.  Patient asked if his cyst is anything to worry about.  He mentions that he was told about this cyst dating back to 2005.   PMH: Past Medical History:  Diagnosis Date  . Carpal  tunnel syndrome    bilateral  . Gallstones 2012  . History of kidney stones   . Hyperlipidemia   . Major depressive disorder, single episode, moderate ([facility]   . Obesity   . Sleep apnea    uses a CPAP    Surgical History: Past Surgical History:  Procedure Laterality Date  . CARPAL TUNNEL RELEASE Bilateral 08/24/2014   Procedure: RIGHT LIMITED OPEN CARPAL TUNNEL RELEASE, LEFT CARPAL  TUNNEL INJECTION;  Surgeon: WRoseanne Kaufman MD;  [facility]  Service: Orthopedics;  Laterality: Bilateral;  . CARPAL TUNNEL RELEASE Left 06/20/2015   Procedure: LEFT CARPAL TUNNEL RELEASE;  Surgeon: WRoseanne Kaufman MD;  [facility] [facility]  Service: Orthopedics;  Laterality: Left;  . CHOLECYSTECTOMY  2012  . EXTRACORPOREAL SHOCK WAVE LITHOTRIPSY Right 10/31/2020   Procedure: EXTRACORPOREAL SHOCK WAVE LITHOTRIPSY (ESWL);  Surgeon: BHollice Espy MD;  Location: [facility];  Service: Urology;  Laterality: Right;  . INGUINAL HERNIA REPAIR Bilateral 1983  . LITHOTRIPSY  12/2003, 09/2006  . TRIGGER FINGER RELEASE     thumb    Home Medications:  Allergies as of 12/04/2020   No Known Allergies     Medication List       Accurate as of December 04, 2020  8:37 AM. If you have any questions, ask your nurse or doctor.        STOP taking these medications   promethazine-dextromethorphan 6.25-15 MG/5ML syrup Commonly known as:  PROMETHAZINE-DM Stopped by: Hollice Espy, MD     TAKE these medications   acetaminophen 500 MG tablet Commonly known as: TYLENOL Take 500 mg by mouth every 6 (six) hours as needed.   desvenlafaxine 100 MG 24 hr tablet Commonly known as: PRISTIQ TAKE 1 TABLET BY MOUTH ONCE DAILY   Dexcom G6 Transmitter Misc Check blood sugar as recommended.   fluticasone 50 MCG/ACT nasal spray Commonly known as: FLONASE USE 2 SPRAYS IN EACH NOSTIL ONCE A DAY   FreeStyle Libre 2 Reader Devi Use to check blood sugar continuous as instructed.   FreeStyle  Libre 2 Sensor Misc USE TO CHECK BLOOD SUGAR CONTINUOUS AS DIRECTED.   FREESTYLE TEST STRIPS test strip Generic drug: glucose blood Use as instructed   glucose monitoring kit monitoring kit 1 each by Does not apply route as needed for other.   ibuprofen 600 MG tablet Commonly known as: ADVIL Take 600 mg by mouth every 6 (six) hours as needed.   indapamide 1.25 MG tablet Commonly known as: LOZOL TAKE 1 TABLET BY MOUTH EVERY MORNING   ipratropium 0.03 % nasal spray Commonly known as: ATROVENT PLACE 2 SPRAYS INTO BOTH NOSTRILS EVERY 12 HOURS.   Moderna COVID-19 Vaccine 100 MCG/0.5ML injection Generic drug: COVID-19 mRNA vaccine (Moderna) USE AS DIRECTED   ondansetron 4 MG disintegrating tablet Commonly known as: ZOFRAN-ODT DISSOLVE 1 TABLET BY MOUTH EVERY 8 HOURS AS NEEDED FOR NAUSEA AND/OR VOMITING   oxyCODONE-acetaminophen 5-325 MG tablet Commonly known as: PERCOCET/ROXICET Take 1-2 tablets by mouth every 6 (six) hours as needed for severe pain.   propranolol 10 MG tablet Commonly known as: INDERAL TAKE 1 TABLET BY MOUTH ONCE DAILY AS NEEDED   pseudoephedrine 60 MG tablet Commonly known as: SUDAFED Take 60 mg by mouth every 4 (four) hours as needed for congestion.   tamsulosin 0.4 MG Caps capsule Commonly known as: FLOMAX TAKE 1 CAPSULE (0.4 MG TOTAL) BY MOUTH DAILY.   Turmeric 450 MG Caps Take 450 mg by mouth daily.       Allergies: No Known Allergies  Family History: Family History  Problem Relation Age of Onset  . Hyperlipidemia Mother   . Hypertension Mother   . Irritable bowel syndrome Mother   . Colon polyps Mother        x lots  . Polycystic ovary syndrome Sister   . Colon cancer Maternal Grandmother        dx in her 82's or 63's  . Lung cancer Maternal Grandmother   . Heart failure Maternal Grandfather   . Heart disease Maternal Grandfather   . Prostate cancer Maternal Grandfather   . Multiple myeloma Paternal Grandfather   . Diabetes  Paternal Grandmother   . Colon polyps Maternal Uncle     Social History:  reports that he has never smoked. He has never used smokeless tobacco. He reports current alcohol use. He reports that he does not use drugs.   Physical Exam: BP (!) 143/88   Pulse 88   Ht 5' 7"  (1.702 m)   Wt 230 lb (104.3 kg)   BMI 36.02 kg/m   Constitutional:  Alert and oriented, No acute distress. HEENT: [place] AT, moist mucus membranes.  Trachea midline, no masses. Cardiovascular: No clubbing, cyanosis, or edema. Respiratory: Normal respiratory effort, no increased work of breathing. Skin: No rashes, bruises or suspicious lesions. Neurologic: Grossly intact, no focal deficits, moving all 4 extremities. Psychiatric: Normal mood and affect.  Urinalysis Urinalysis today with greater than 50 red  blood cells, otherwise unremarkable   Pertinent Imaging:  CLINICAL DATA:  Flank pain with dysuria for 6 hours. History of lithotripsy 3 weeks ago.  EXAM: CT ABDOMEN AND PELVIS WITHOUT CONTRAST  TECHNIQUE: Multidetector CT imaging of the abdomen and pelvis was performed following the standard protocol without IV contrast.  COMPARISON:  01/01/2016  FINDINGS: Lower chest:  Calcified pulmonary and thoracic nodal granulomas.  Hepatobiliary: Steatosis of the liver. No focal abnormality.Cholecystectomy.  Pancreas: Unremarkable.  Spleen: Unremarkable.  Adrenals/Urinary Tract: Negative adrenals. Long-standing lesion from the lower pole right kidney with cystic density in 2012 and homogeneous high-density in 2017. There has been enlargement since priors, now 5.6 cm with high-density and peripheral stranding. Additional nodule exophytic from the upper pole is low-density right kidney. 2.4 cm cystic density at the upper pole left kidney. 3 mm left UVJ calculus with mild hydroureteronephrosis. No right hydronephrosis but there are 3 mid ureteral calculi clustered at the level of L3/4 and individually measuring  3 mm. Bilateral nephrolithiasis numbering at least 2 on the left and 3 on the right. The interpolar calculus on the right is largest and measures 4 mm.  Stomach/Bowel:  No obstruction. No visible bowel inflammation.  Vascular/Lymphatic: No acute vascular abnormality. No mass or adenopathy.  Reproductive:Nonspecific prostate calcification.  Other: No ascites or pneumoperitoneum.  Musculoskeletal: No acute abnormalities.  IMPRESSION: 1. Mild left hydroureteronephrosis from a 3 mm UVJ calculus. 2. 3 mid right ureteral calculi individually measuring up to 3 mm. No associated hydronephrosis. 3. Chronic hemorrhagic cyst at the lower pole right kidney when compared with priors. There has been enlargement and adjacent fat stranding suggesting recent intracystic hemorrhage. Recommend renal MRI with contrast after convalescence. 4. Bilateral nephrolithiasis. 5. Hepatic steatosis.   Electronically Signed   By: Monte Fantasia M.D.   On: 11/24/2020 06:28   I have personally reviewed the images and agree with radiologist interpretation.  He also had a KUB today which was fairly low quality.  I was able to see that nonobstructing stones on the right but ureteral calculi were not visualized.  Assessment & Plan:    1. Nephrolithiasis Retained right ureteral calculi x3 status post ESWL on most recent CT scan however nonobstructing and currently asymptomatic  KUB today does not clearly show the stones however based on previous CT scan as well as persistence of microscopic blood, suspect is retained  We discussed that given that he is failed to pass the spontaneously, would strongly consider ureteroscopic intervention to clear the stones as well as treat all of his upper tract stone as well at the same time.  Risks and benefits of ureteroscopy were reviewed including but not limited to infection, bleeding, pain, ureteral injury which could require open surgery versus prolonged indwelling if  ureteral perforation occurs, persistent stone disease, requirement for staged procedure, possible stent, and global anesthesia risks. Patient expressed understanding and desires to proceed with ureteroscopy.  Also plan to do a left retrograde pyelogram to ensure that he is passed his left punctate ureteral calculus although given lack of symptoms, likely has done so  2. Renal cyst Seen on previous imaging, likely benign Will evaluate with RUS following ureteroscopy, pending on appearance, no consider MRI depending on renal ultrasound results Also discussed it may be helpful to have previous imaging for comparison, assess need for more advanced imaging  3. Undesired fertility Desires to schedule vasectomy, worried about recovery, will defer this until July in the office.   Ardyth Gal  I, Ardyth Gal, am  acting as a scribe for Dr. Hollice Espy.   I have reviewed the above documentation for accuracy and completeness, and I agree with the above.   Hollice Espy, MD   [facility] [address] [place], [place] [address] [phone]

## 2020-12-04 ENCOUNTER — Encounter: Payer: Self-pay | Admitting: Urology

## 2020-12-04 ENCOUNTER — Ambulatory Visit
Admission: RE | Admit: 2020-12-04 | Discharge: 2020-12-04 | Disposition: A | Discharge status: Home / Self Care | Payer: 59 | Source: Ambulatory Visit | Attending: Physician Assistant | Admitting: Physician Assistant

## 2020-12-04 ENCOUNTER — Other Ambulatory Visit: Payer: Self-pay

## 2020-12-04 ENCOUNTER — Other Ambulatory Visit (HOSPITAL_COMMUNITY): Payer: Self-pay

## 2020-12-04 ENCOUNTER — Ambulatory Visit (INDEPENDENT_AMBULATORY_CARE_PROVIDER_SITE_OTHER): Payer: 59 | Admitting: Urology

## 2020-12-04 ENCOUNTER — Other Ambulatory Visit: Payer: Self-pay | Admitting: Urology

## 2020-12-04 VITALS — BP 143/88 | HR 88 | Ht 67.0 in | Wt 230.0 lb

## 2020-12-04 DIAGNOSIS — N200 Calculus of kidney: Secondary | ICD-10-CM

## 2020-12-04 DIAGNOSIS — N201 Calculus of ureter: Secondary | ICD-10-CM

## 2020-12-05 ENCOUNTER — Other Ambulatory Visit (HOSPITAL_COMMUNITY): Payer: Self-pay

## 2020-12-05 LAB — URINALYSIS, COMPLETE
Bilirubin, UA: NEGATIVE
Glucose, UA: NEGATIVE
Ketones, UA: NEGATIVE
Leukocytes,UA: NEGATIVE
Nitrite, UA: NEGATIVE
Protein,UA: NEGATIVE
Specific Gravity, UA: 1.02 (ref 1.005–1.030)
Urobilinogen, Ur: 0.2 mg/dL (ref 0.2–1.0)
pH, UA: 6 (ref 5.0–7.5)

## 2020-12-05 LAB — MICROSCOPIC EXAMINATION
Bacteria, UA: NONE SEEN
RBC, Urine: >30 /hpf — AB (ref 0–2)

## 2020-12-05 MED ORDER — ARMOUR THYROID 30 MG PO TABS
ORAL_TABLET | ORAL | 0 refills | Status: DC
  Filled 2020-12-05: qty 180, 90d supply, fill #0

## 2020-12-09 LAB — CULTURE, URINE COMPREHENSIVE

## 2020-12-10 ENCOUNTER — Other Ambulatory Visit: Payer: Self-pay

## 2020-12-10 ENCOUNTER — Other Ambulatory Visit
Admission: RE | Admit: 2020-12-10 | Discharge: 2020-12-10 | Disposition: A | Discharge status: Home / Self Care | Payer: 59 | Source: Ambulatory Visit | Attending: Urology | Admitting: Urology

## 2020-12-10 NOTE — Patient Instructions (Addendum)
Your procedure is scheduled on: 12/16/20 - Monday Report to the Registration Desk on the 1st floor of the [facility]. To find out your arrival time, please call [phone] between 1PM - 3PM on: 12/13/20 - Friday  REMEMBER: Instructions that are not followed completely may result in serious medical risk, up to and including death; or upon the discretion of your surgeon and anesthesiologist your surgery may need to be rescheduled.  Do not eat food or drink any fluids after midnight the night before surgery.  No gum chewing, lozengers or hard candies.   TAKE THESE MEDICATIONS THE MORNING OF SURGERY WITH A SIP OF WATER: - desvenlafaxine (PRISTIQ) 100 MG 24 hr tablet - fluticasone (FLONASE) 50 MCG/ACT nasal spray - tamsulosin (FLOMAX) 0.4 MG CAPS capsule  Follow recommendations from Cardiologist, Pulmonologist or PCP regarding stopping Aspirin, Coumadin, Plavix, Eliquis, Pradaxa, or Pletal.  One week prior to surgery: Stop Anti-inflammatories (NSAIDS) such as Advil, Aleve, Ibuprofen, Motrin, Naproxen, Naprosyn and Aspirin based products such as Excedrin, Goodys Powder, BC Powder. Stop ANY OVER THE COUNTER supplements until after surgery.  No Alcohol for 24 hours before or after surgery.  No Smoking including e-cigarettes for 24 hours prior to surgery.  No chewable tobacco products for at least 6 hours prior to surgery.  No nicotine patches on the day of surgery.  Do not use any "recreational" drugs for at least a week prior to your surgery.  Please be advised that the combination of cocaine and anesthesia may have negative outcomes, up to and including death. If you test positive for cocaine, your surgery will be cancelled.  On the morning of surgery brush your teeth with toothpaste and water, you may rinse your mouth with mouthwash if you wish. Do not swallow any toothpaste or mouthwash.  Do not wear jewelry, make-up, hairpins, clips or nail polish.  Do not wear lotions,  powders, or perfumes.   Do not shave body from the neck down 48 hours prior to surgery just in case you cut yourself which could leave a site for infection.  Also, freshly shaved skin may become irritated if using the CHG soap.  Contact lenses, hearing aids and dentures may not be worn into surgery.  Do not bring valuables to the hospital. [facility] is not responsible for any missing/lost belongings or valuabl  Bring your C-PAP to the hospital with you in case you may have to spend the night.   Notify your doctor if there is any change in your medical condition (cold, fever, infection).  Wear comfortable clothing (specific to your surgery type) to the hospital.  Plan for stool softeners for home use; pain medications have a tendency to cause constipation. You can also help prevent constipation by eating foods high in fiber such as fruits and vegetables and drinking plenty of fluids as your diet allows.  After surgery, you can help prevent lung complications by doing breathing exercises.  Take deep breaths and cough every 1-2 hours. Your doctor may order a device called an Incentive Spirometer to help you take deep breaths. When coughing or sneezing, hold a pillow firmly against your incision with both hands. This is called "splinting." Doing this helps protect your incision. It also decreases belly discomfort.  If you are being admitted to the hospital overnight, leave your suitcase in the car. After surgery it may be brought to your room.  If you are being discharged the day of surgery, you will not be allowed to drive home. You  will need a responsible adult (18 years or older) to drive you home and stay with you that night.   If you are taking public transportation, you will need to have a responsible adult (18 years or older) with you. Please confirm with your physician that it is acceptable to use public transportation.   Please call the [facility] at [phone] if you have any questions about these instructions.  Surgery Visitation Policy:  Patients undergoing a surgery or procedure may have one family member or support person with them as long as that person is not COVID-19 positive or experiencing its symptoms.  That person may remain in the waiting area during the procedure.  Inpatient Visitation:    Visiting hours are 7 a.m. to 8 p.m. Inpatients will be allowed two visitors daily. The visitors may change each day during the patient's stay. No visitors under the age of 22. Any visitor under the age of 37 must be accompanied by an adult. The visitor must pass COVID-19 screenings, use hand sanitizer when entering and exiting the patient's room and wear a mask at all times, including in the patient's room. Patients must also wear a mask when staff or their visitor are in the room. Masking is required regardless of vaccination status.

## 2020-12-12 ENCOUNTER — Ambulatory Visit: Payer: Self-pay | Admitting: Physician Assistant

## 2020-12-16 ENCOUNTER — Encounter
Admission: RE | Disposition: A | Discharge status: Home / Self Care | Payer: Self-pay | Source: Home / Self Care | Attending: Urology

## 2020-12-16 ENCOUNTER — Ambulatory Visit: Payer: 59 | Admitting: Anesthesiology

## 2020-12-16 ENCOUNTER — Ambulatory Visit
Admission: RE | Admit: 2020-12-16 | Discharge: 2020-12-16 | Disposition: A | Discharge status: Home / Self Care | Payer: 59 | Attending: Urology | Admitting: Urology

## 2020-12-16 ENCOUNTER — Other Ambulatory Visit: Payer: Self-pay

## 2020-12-16 ENCOUNTER — Encounter: Payer: Self-pay | Admitting: Urology

## 2020-12-16 ENCOUNTER — Ambulatory Visit: Payer: 59

## 2020-12-16 DIAGNOSIS — E669 Obesity, unspecified: Secondary | ICD-10-CM | POA: Insufficient documentation

## 2020-12-16 DIAGNOSIS — Z9049 Acquired absence of other specified parts of digestive tract: Secondary | ICD-10-CM | POA: Diagnosis not present

## 2020-12-16 DIAGNOSIS — Z79899 Other long term (current) drug therapy: Secondary | ICD-10-CM | POA: Insufficient documentation

## 2020-12-16 DIAGNOSIS — Z8379 Family history of other diseases of the digestive system: Secondary | ICD-10-CM | POA: Insufficient documentation

## 2020-12-16 DIAGNOSIS — Z8371 Family history of colonic polyps: Secondary | ICD-10-CM | POA: Diagnosis not present

## 2020-12-16 DIAGNOSIS — G4733 Obstructive sleep apnea (adult) (pediatric): Secondary | ICD-10-CM | POA: Insufficient documentation

## 2020-12-16 DIAGNOSIS — N202 Calculus of kidney with calculus of ureter: Secondary | ICD-10-CM | POA: Insufficient documentation

## 2020-12-16 DIAGNOSIS — N281 Cyst of kidney, acquired: Secondary | ICD-10-CM | POA: Insufficient documentation

## 2020-12-16 DIAGNOSIS — N201 Calculus of ureter: Secondary | ICD-10-CM | POA: Diagnosis not present

## 2020-12-16 DIAGNOSIS — E785 Hyperlipidemia, unspecified: Secondary | ICD-10-CM | POA: Insufficient documentation

## 2020-12-16 DIAGNOSIS — Z6836 Body mass index (BMI) 36.0-36.9, adult: Secondary | ICD-10-CM | POA: Insufficient documentation

## 2020-12-16 DIAGNOSIS — N200 Calculus of kidney: Secondary | ICD-10-CM

## 2020-12-16 HISTORY — PX: CYSTOSCOPY W/ RETROGRADES: SHX1426

## 2020-12-16 HISTORY — PX: CYSTOSCOPY/URETEROSCOPY/HOLMIUM LASER/STENT PLACEMENT: SHX6546

## 2020-12-16 SURGERY — CYSTOSCOPY/URETEROSCOPY/HOLMIUM LASER/STENT PLACEMENT
Anesthesia: General | Laterality: Right

## 2020-12-16 MED ORDER — FENTANYL CITRATE (PF) 100 MCG/2ML IJ SOLN: 25.0000 ug | INTRAMUSCULAR | Status: DC | PRN

## 2020-12-16 MED ORDER — CEFAZOLIN SODIUM-DEXTROSE 2-4 GM/100ML-% IV SOLN
INTRAVENOUS | Status: AC
  Filled 2020-12-16: qty 100

## 2020-12-16 MED ORDER — IOHEXOL 180 MG/ML  SOLN
INTRAMUSCULAR | Status: DC | PRN
  Administered 2020-12-16: 6 mL

## 2020-12-16 MED ORDER — ACETAMINOPHEN 10 MG/ML IV SOLN: 1000.0000 mg | Freq: Once | INTRAVENOUS | Status: DC | PRN

## 2020-12-16 MED ORDER — ONDANSETRON HCL 4 MG/2ML IJ SOLN
INTRAMUSCULAR | Status: DC | PRN
  Administered 2020-12-16: 4 mg via INTRAVENOUS

## 2020-12-16 MED ORDER — PROPOFOL 10 MG/ML IV BOLUS
INTRAVENOUS | Status: AC
  Filled 2020-12-16: qty 20

## 2020-12-16 MED ORDER — MIDAZOLAM HCL 2 MG/2ML IJ SOLN
INTRAMUSCULAR | Status: AC
  Filled 2020-12-16: qty 2

## 2020-12-16 MED ORDER — OXYBUTYNIN CHLORIDE 5 MG PO TABS
5.0000 mg | ORAL_TABLET | Freq: Three times a day (TID) | ORAL | 0 refills | Status: DC | PRN
  Filled 2020-12-16: qty 10, 4d supply, fill #0

## 2020-12-16 MED ORDER — DEXAMETHASONE SODIUM PHOSPHATE 10 MG/ML IJ SOLN
INTRAMUSCULAR | Status: AC
  Filled 2020-12-16: qty 1

## 2020-12-16 MED ORDER — OXYCODONE HCL 5 MG PO TABS: 5.0000 mg | ORAL_TABLET | Freq: Once | ORAL | Status: DC | PRN

## 2020-12-16 MED ORDER — ORAL CARE MOUTH RINSE: 15.0000 mL | Freq: Once | OROMUCOSAL | Status: AC

## 2020-12-16 MED ORDER — FENTANYL CITRATE (PF) 100 MCG/2ML IJ SOLN
INTRAMUSCULAR | Status: AC
  Filled 2020-12-16: qty 2

## 2020-12-16 MED ORDER — MIDAZOLAM HCL 2 MG/2ML IJ SOLN
INTRAMUSCULAR | Status: DC | PRN
  Administered 2020-12-16: 2 mg via INTRAVENOUS

## 2020-12-16 MED ORDER — LACTATED RINGERS IV SOLN: INTRAVENOUS | Status: DC

## 2020-12-16 MED ORDER — LIDOCAINE HCL (PF) 2 % IJ SOLN
INTRAMUSCULAR | Status: AC
  Filled 2020-12-16: qty 5

## 2020-12-16 MED ORDER — CEFAZOLIN SODIUM-DEXTROSE 2-4 GM/100ML-% IV SOLN
2.0000 g | INTRAVENOUS | Status: AC
  Administered 2020-12-16: 2 g via INTRAVENOUS

## 2020-12-16 MED ORDER — CHLORHEXIDINE GLUCONATE 0.12 % MT SOLN: 15.0000 mL | Freq: Once | OROMUCOSAL | Status: AC

## 2020-12-16 MED ORDER — ROCURONIUM BROMIDE 10 MG/ML (PF) SYRINGE
PREFILLED_SYRINGE | INTRAVENOUS | Status: AC
  Filled 2020-12-16: qty 10

## 2020-12-16 MED ORDER — OXYCODONE HCL 5 MG/5ML PO SOLN: 5.0000 mg | Freq: Once | ORAL | Status: DC | PRN

## 2020-12-16 MED ORDER — SUGAMMADEX SODIUM 200 MG/2ML IV SOLN
INTRAVENOUS | Status: DC | PRN
  Administered 2020-12-16: 200 mg via INTRAVENOUS

## 2020-12-16 MED ORDER — KETOROLAC TROMETHAMINE 30 MG/ML IJ SOLN
INTRAMUSCULAR | Status: AC
  Filled 2020-12-16: qty 1

## 2020-12-16 MED ORDER — FAMOTIDINE 20 MG PO TABS: 20.0000 mg | ORAL_TABLET | Freq: Once | ORAL | Status: AC

## 2020-12-16 MED ORDER — LIDOCAINE HCL (CARDIAC) PF 100 MG/5ML IV SOSY
PREFILLED_SYRINGE | INTRAVENOUS | Status: DC | PRN
  Administered 2020-12-16: 100 mg via INTRAVENOUS

## 2020-12-16 MED ORDER — ONDANSETRON HCL 4 MG/2ML IJ SOLN
INTRAMUSCULAR | Status: AC
  Filled 2020-12-16: qty 2

## 2020-12-16 MED ORDER — ACETAMINOPHEN 10 MG/ML IV SOLN
INTRAVENOUS | Status: AC
  Filled 2020-12-16: qty 100

## 2020-12-16 MED ORDER — ROCURONIUM BROMIDE 100 MG/10ML IV SOLN
INTRAVENOUS | Status: DC | PRN
  Administered 2020-12-16: 50 mg via INTRAVENOUS

## 2020-12-16 MED ORDER — PROPOFOL 10 MG/ML IV BOLUS
INTRAVENOUS | Status: DC | PRN
  Administered 2020-12-16: 200 mg via INTRAVENOUS

## 2020-12-16 MED ORDER — FAMOTIDINE 20 MG PO TABS
ORAL_TABLET | ORAL | Status: AC
  Administered 2020-12-16: 20 mg via ORAL
  Filled 2020-12-16: qty 1

## 2020-12-16 MED ORDER — FENTANYL CITRATE (PF) 100 MCG/2ML IJ SOLN
INTRAMUSCULAR | Status: DC | PRN
  Administered 2020-12-16 (×2): 50 ug via INTRAVENOUS

## 2020-12-16 MED ORDER — CHLORHEXIDINE GLUCONATE 0.12 % MT SOLN
OROMUCOSAL | Status: AC
  Administered 2020-12-16: 15 mL via OROMUCOSAL
  Filled 2020-12-16: qty 15

## 2020-12-16 MED ORDER — DEXAMETHASONE SODIUM PHOSPHATE 10 MG/ML IJ SOLN
INTRAMUSCULAR | Status: DC | PRN
  Administered 2020-12-16: 10 mg via INTRAVENOUS

## 2020-12-16 MED ORDER — KETOROLAC TROMETHAMINE 30 MG/ML IJ SOLN
INTRAMUSCULAR | Status: DC | PRN
  Administered 2020-12-16: 30 mg via INTRAVENOUS

## 2020-12-16 MED ORDER — ONDANSETRON HCL 4 MG/2ML IJ SOLN: 4.0000 mg | Freq: Once | INTRAMUSCULAR | Status: DC | PRN

## 2020-12-16 MED ORDER — ACETAMINOPHEN 10 MG/ML IV SOLN
INTRAVENOUS | Status: DC | PRN
  Administered 2020-12-16: 1000 mg via INTRAVENOUS

## 2020-12-16 SURGICAL SUPPLY — 31 items
ADH LQ OCL WTPRF AMP STRL LF (MISCELLANEOUS) ×2
ADHESIVE MASTISOL STRL (MISCELLANEOUS) ×2 IMPLANT
BAG DRAIN CYSTO-URO LG1000N (MISCELLANEOUS) ×3 IMPLANT
BASKET ZERO TIP 1.9FR (BASKET) ×1 IMPLANT
BRUSH SCRUB EZ 1% IODOPHOR (MISCELLANEOUS) ×3 IMPLANT
BSKT STON RTRVL ZERO TP 1.9FR (BASKET) ×2
CATH URET FLEX-TIP 2 LUMEN 10F (CATHETERS) IMPLANT
CATH URETL 5X70 OPEN END (CATHETERS) ×3 IMPLANT
CNTNR SPEC 2.5X3XGRAD LEK (MISCELLANEOUS) ×2
CONT SPEC 4OZ STER OR WHT (MISCELLANEOUS) ×1
CONT SPEC 4OZ STRL OR WHT (MISCELLANEOUS) ×2
CONTAINER SPEC 2.5X3XGRAD LEK (MISCELLANEOUS) IMPLANT
DRAPE UTILITY 15X26 TOWEL STRL (DRAPES) ×3 IMPLANT
DRSG TEGADERM 2-3/8X2-3/4 SM (GAUZE/BANDAGES/DRESSINGS) ×2 IMPLANT
GLOVE SURG ENC MOIS LTX SZ6.5 (GLOVE) ×3 IMPLANT
GOWN STRL REUS W/ TWL LRG LVL3 (GOWN DISPOSABLE) ×4 IMPLANT
GOWN STRL REUS W/TWL LRG LVL3 (GOWN DISPOSABLE) ×6
GUIDEWIRE GREEN .038 145CM (MISCELLANEOUS) IMPLANT
GUIDEWIRE STR DUAL SENSOR (WIRE) ×3 IMPLANT
INFUSOR MANOMETER BAG 3000ML (MISCELLANEOUS) ×3 IMPLANT
IV NS IRRIG 3000ML ARTHROMATIC (IV SOLUTION) ×3 IMPLANT
KIT TURNOVER CYSTO (KITS) ×3 IMPLANT
MANIFOLD NEPTUNE II (INSTRUMENTS) ×3 IMPLANT
PACK CYSTO AR (MISCELLANEOUS) ×3 IMPLANT
SET CYSTO W/LG BORE CLAMP LF (SET/KITS/TRAYS/PACK) ×3 IMPLANT
SHEATH URETERAL 12FRX35CM (MISCELLANEOUS) IMPLANT
STENT URET 6FRX24 CONTOUR (STENTS) IMPLANT
STENT URET 6FRX26 CONTOUR (STENTS) ×1 IMPLANT
SURGILUBE 2OZ TUBE FLIPTOP (MISCELLANEOUS) ×3 IMPLANT
TRACTIP FLEXIVA PULSE ID 200 (Laser) ×3 IMPLANT
WATER STERILE IRR 1000ML POUR (IV SOLUTION) ×3 IMPLANT

## 2020-12-16 NOTE — Interval H&P Note (Signed)
History and Physical Interval Note:  12/16/2020 12:27 PM  Aaron Malone  has presented today for surgery, with the diagnosis of Right Ureteral/ Kidney Stones.  The various methods of treatment have been discussed with the patient and family. After consideration of risks, benefits and other options for treatment, the patient has consented to  Procedure(s): CYSTOSCOPY/URETEROSCOPY/HOLMIUM LASER/STENT PLACEMENT (Right) CYSTOSCOPY WITH RETROGRADE PYELOGRAM (Bilateral) as a surgical intervention.  The patient's history has been reviewed, patient examined, no change in status, stable for surgery.  I have reviewed the patient's chart and labs.  Questions were answered to the patient's satisfaction.     RRR CTAB  Hollice Espy

## 2020-12-16 NOTE — Transfer of Care (Signed)
Immediate Anesthesia Transfer of Care Note  Patient: Aaron Malone  Procedure(s) Performed: CYSTOSCOPY/URETEROSCOPY/HOLMIUM LASER/STENT PLACEMENT (Right ) CYSTOSCOPY WITH RETROGRADE PYELOGRAM (Bilateral )  Patient Location: PACU  Anesthesia Type:General  Level of Consciousness: awake, alert  and oriented  Airway & Oxygen Therapy: Patient Spontanous Breathing and Patient connected to face mask oxygen  Post-op Assessment: Report given to RN and Post -op Vital signs reviewed and stable  Post vital signs: Reviewed and stable  Last Vitals:  Vitals Value Taken Time  BP 110/81 12/16/20 1400  Temp    Pulse 90 12/16/20 1405  Resp 21 12/16/20 1405  SpO2 92 % 12/16/20 1405  Vitals shown include unvalidated device data.  Last Pain:  Vitals:   12/16/20 0948  TempSrc: Temporal  PainSc: 0-No pain      Patients Stated Pain Goal: 0 (04/54/09 8119)  Complications: No complications documented.

## 2020-12-16 NOTE — Anesthesia Preprocedure Evaluation (Signed)
Anesthesia Evaluation  Patient identified by MRN, date of birth, ID band Patient awake    Reviewed: Allergy & Precautions, NPO status , Patient's Chart, lab work & pertinent test results  History of Anesthesia Complications Negative for: history of anesthetic complications  Airway Mallampati: II  TM Distance: >3 FB Neck ROM: Full    Dental  (+) Chipped   Pulmonary sleep apnea and Continuous Positive Airway Pressure Ventilation , neg COPD, Patient abstained from smoking.Not current smoker,    Pulmonary exam normal breath sounds clear to auscultation       Cardiovascular Exercise Tolerance: Good METS(-) hypertension(-) CAD and (-) Past MI negative cardio ROS  (-) dysrhythmias  Rhythm:Regular Rate:Normal - Systolic murmurs    Neuro/Psych PSYCHIATRIC DISORDERS Anxiety Depression  Neuromuscular disease    GI/Hepatic neg GERD  ,(+)     (-) substance abuse  ,   Endo/Other  neg diabetes  Renal/GU negative Renal ROS     Musculoskeletal   Abdominal   Peds  Hematology   Anesthesia Other Findings Past Medical History: No date: Carpal tunnel syndrome     Comment:  bilateral 08/2020: COVID-19 2012: Gallstones No date: History of kidney stones No date: Hyperlipidemia No date: Major depressive disorder, single episode, moderate (HCC) No date: Obesity No date: Sleep apnea     Comment:  uses a CPAP  Reproductive/Obstetrics                             Anesthesia Physical Anesthesia Plan  ASA: II  Anesthesia Plan: General   Post-op Pain Management:    Induction: Intravenous  PONV Risk Score and Plan: 3 and Ondansetron, Dexamethasone and Midazolam  Airway Management Planned: Oral ETT  Additional Equipment: None  Intra-op Plan:   Post-operative Plan: Extubation in OR  Informed Consent: I have reviewed the patients History and Physical, chart, labs and discussed the procedure including  the risks, benefits and alternatives for the proposed anesthesia with the patient or authorized representative who has indicated his/her understanding and acceptance.     Dental advisory given  Plan Discussed with: CRNA and Surgeon  Anesthesia Plan Comments: (Discussed risks of anesthesia with patient, including PONV, sore throat, lip/dental damage. Rare risks discussed as well, such as cardiorespiratory and neurological sequelae. Patient understands.)        Anesthesia Quick Evaluation

## 2020-12-16 NOTE — Discharge Instructions (Signed)
You have a ureteral stent in place.  This is a tube that extends from your kidney to your bladder.  This may cause urinary bleeding, burning with urination, and urinary frequency.  Please call our office or present to the ED if you develop fevers >101 or pain which is not able to be controlled with oral pain medications.  You may be given either Flomax and/ or ditropan to help with bladder spasms and stent pain in addition to pain medications.    You may remove your stent on Thursday.  To remove it, untape the string and pulled gently until the entire stent is removed.  If you have any issues, please call our office and we will be happy to assist you.  [facility] [address] [place], [place] [address] [phone]

## 2020-12-16 NOTE — Anesthesia Postprocedure Evaluation (Signed)
Anesthesia Post Note  Patient: Aaron Malone  Procedure(s) Performed: CYSTOSCOPY/URETEROSCOPY/HOLMIUM LASER/STENT PLACEMENT (Right ) CYSTOSCOPY WITH RETROGRADE PYELOGRAM (Bilateral )  Patient location during evaluation: PACU Anesthesia Type: General Level of consciousness: awake and alert Pain management: pain level controlled Vital Signs Assessment: post-procedure vital signs reviewed and stable Respiratory status: spontaneous breathing, nonlabored ventilation, respiratory function stable and patient connected to nasal cannula oxygen Cardiovascular status: blood pressure returned to baseline and stable Postop Assessment: no apparent nausea or vomiting Anesthetic complications: no   No complications documented.   Last Vitals:  Vitals:   12/16/20 1415 12/16/20 1430  BP: (!) 110/96 115/69  Pulse: 90 86  Resp: 14 17  Temp: 36.4 C   SpO2: 93% 95%    Last Pain:  Vitals:   12/16/20 1430  TempSrc:   PainSc: 0-No pain                 Precious Haws [name]

## 2020-12-16 NOTE — Op Note (Signed)
Date of procedure: 12/16/20  Preoperative diagnosis:  1. Right ureteral calculi 2. Right renal calculi 3. Left UVJ stone  Postoperative diagnosis:  1. Same as above  Procedure: 1. Bilateral retrograde pyelogram 2. Right ureteroscopy with laser lithotripsy 3. Basket extraction of stone fragment 4. Right ureteral stent placement 5. Interpretation of fluoroscopy less than 30 minutes  Surgeon: Hollice Espy, MD  Anesthesia: General  Complications: None  Intraoperative findings: At least 3 or 4 right distal ureteral calculi, largest of which was located right at the UO obstructing the more proximal stones.  Nonobstructing 4 mm right renal calculus treated in the midpole.  EBL: Minimal  Specimens: Stone fragment  Drains: 6 x 26 French double-J ureteral stent on right with tether  Indication: Aaron Malone is a 45 y.o. patient with obstructing ureteral fragments on the right following ESWL which he failed to pass.  After reviewing the management options for treatment, he elected to proceed with the above surgical procedure(s). We have discussed the potential benefits and risks of the procedure, side effects of the proposed treatment, the likelihood of the patient achieving the goals of the procedure, and any potential problems that might occur during the procedure or recuperation. Informed consent has been obtained.  Description of procedure:  The patient was taken to the operating room and general anesthesia was induced.  The patient was placed in the dorsal lithotomy position, prepped and draped in the usual sterile fashion, and preoperative antibiotics were administered. A preoperative time-out was performed.   A 21 French scope was advanced per urethra into the bladder.  Attention was first turned to the left ureteral orifice which was cannulated using a 5 [place] open-ended ureteral catheter.  A gentle retrograde pyelogram on this side revealed no hydroureteronephrosis or filling  defects.  There was no concern for retained UVJ stone.  Attention was then turned to the level of right side.  The tip of the stone was seen emanating from the orifice itself.  I was able to manipulate it slightly in order to intubate the orifice with a 5 [place] open-ended ureteral catheter.  A gentle retrograde pyelogram was performed which showed some filling defects within the distal ureter concerning for retained stone.  There are some mild dilation of the collecting system as well.  A wire was then placed up to the level of the kidney.  This was snapped in place as a safety wire.  A 4.5 French semirigid ureteroscope was then brought in and advanced through the UO where the for stone was encountered.  This leading stone was actually quite large, at least 4 mm and was too large to basket.  I brought in a 242 m laser fiber and using both dusting settings of 0.3 J and 60 Hz as well as more standard settings of 0.8 J and 10 Hz, the stones were fragmented.  I ended up using a tipless nitinol basket to remove all of the fragments.  Notably, there were at least 3 or 4 stones in the distal ureter all the way up to the level of the iliacs but nothing above this level.  I was not able to scope beyond the mid ureter.  Once the ureter was adequately cleared, he advanced a Super Stiff wire up to the level of the kidney as a working wire.  I then advanced a 7 [place] digital flexible ureteroscope over this wire up to the level of the kidney.  The kidney was surveyed.  A 4 mm stone  in the midpole calyx was identified.  There are few Randall's plaques but no other nonobstructing stones appreciated.  The stone was then dusted.  A final retrograde pyelogram created a roadmap to ensure that each every calyx have been directly visualized.  Most of the stone was dusted but a small fragment of the stone which was slightly larger than the tip of the laser fiber was grasped using the basket and removed while removing the  ureteroscope.  I inspected the ureter along the way which revealed no significant trauma other than a small mucosal abrasion where the bleeding stone had been lodged.  There is also a small amount of edema right at the UO.  No other additional fragments were appreciated.  Finally, 6 x 26 French double-J ureteral stent was advanced over the safety wire up to the level of the renal pelvis.  Once wire was then withdrawn, full coils noted both within the renal pelvis as well as within the bladder.  The string was left attached to the distal coil which was it fixed to the patient's glans using Mastisol and Tegaderm.  Plan: We will have him remove his own stent on Thursday.  He will follow-up in 4 weeks with renal ultrasound prior.  Hollice Espy, M.D.

## 2020-12-16 NOTE — Interval H&P Note (Signed)
History and Physical Interval Note:  12/16/2020 12:25 PM  Aaron Malone  has presented today for surgery, with the diagnosis of Right Ureteral/ Kidney Stones.  The various methods of treatment have been discussed with the patient and family. After consideration of risks, benefits and other options for treatment, the patient has consented to  Procedure(s): CYSTOSCOPY/URETEROSCOPY/HOLMIUM LASER/STENT PLACEMENT (Right) CYSTOSCOPY WITH RETROGRADE PYELOGRAM (Bilateral) as a surgical intervention.  The patient's history has been reviewed, patient examined, no change in status, stable for surgery.  I have reviewed the patient's chart and labs.  Questions were answered to the patient's satisfaction.    RRR CTAB  Hollice Espy

## 2020-12-16 NOTE — Anesthesia Procedure Notes (Signed)
Procedure Name: Intubation Date/Time: 12/16/2020 12:59 PM Performed by: Debe Coder, CRNA Pre-anesthesia Checklist: Patient identified, Emergency Drugs available, Suction available and Patient being monitored Patient Re-evaluated:Patient Re-evaluated prior to induction Oxygen Delivery Method: Circle system utilized Preoxygenation: Pre-oxygenation with 100% oxygen Induction Type: IV induction Ventilation: Mask ventilation without difficulty Laryngoscope Size: 3 and McGraph Grade View: Grade I Tube type: Oral Tube size: 7.0 mm Number of attempts: 1 Airway Equipment and Method: Stylet Placement Confirmation: ETT inserted through vocal cords under direct vision,  positive ETCO2 and breath sounds checked- equal and bilateral Secured at: 22 cm Tube secured with: Tape Dental Injury: Teeth and Oropharynx as per pre-operative assessment

## 2020-12-17 ENCOUNTER — Other Ambulatory Visit: Payer: Self-pay

## 2020-12-17 ENCOUNTER — Encounter: Payer: Self-pay | Admitting: Urology

## 2020-12-17 DIAGNOSIS — N201 Calculus of ureter: Secondary | ICD-10-CM

## 2020-12-20 LAB — CALCULI, WITH PHOTOGRAPH (CLINICAL LAB)
Calcium Oxalate Monohydrate: 95 %
Hydroxyapatite: 5 %
Weight Calculi: 4 mg

## 2020-12-29 ENCOUNTER — Other Ambulatory Visit: Payer: Self-pay | Admitting: Family Medicine

## 2020-12-29 ENCOUNTER — Other Ambulatory Visit: Payer: Self-pay

## 2020-12-29 ENCOUNTER — Encounter: Payer: Self-pay | Admitting: Urology

## 2020-12-29 ENCOUNTER — Other Ambulatory Visit: Payer: Self-pay | Admitting: Physician Assistant

## 2020-12-29 DIAGNOSIS — N200 Calculus of kidney: Secondary | ICD-10-CM

## 2020-12-30 ENCOUNTER — Other Ambulatory Visit (HOSPITAL_COMMUNITY): Payer: Self-pay

## 2020-12-30 MED ORDER — INDAPAMIDE 1.25 MG PO TABS
ORAL_TABLET | Freq: Every morning | ORAL | 2 refills | Status: DC
  Filled 2020-12-30: qty 90, 90d supply, fill #0
  Filled 2021-04-07: qty 90, 90d supply, fill #1

## 2020-12-30 MED ORDER — DESVENLAFAXINE SUCCINATE ER 100 MG PO TB24
100.0000 mg | ORAL_TABLET | Freq: Every day | ORAL | 2 refills | Status: DC
  Filled 2020-12-30: qty 90, 90d supply, fill #0
  Filled 2021-04-07: qty 90, 90d supply, fill #1
  Filled 2021-07-01: qty 90, 90d supply, fill #2

## 2020-12-31 ENCOUNTER — Other Ambulatory Visit (HOSPITAL_COMMUNITY): Payer: Self-pay

## 2021-01-01 ENCOUNTER — Other Ambulatory Visit (HOSPITAL_COMMUNITY): Payer: Self-pay

## 2021-01-01 ENCOUNTER — Encounter (HOSPITAL_COMMUNITY): Payer: Self-pay

## 2021-01-06 ENCOUNTER — Ambulatory Visit (HOSPITAL_COMMUNITY)
Admission: RE | Admit: 2021-01-06 | Discharge: 2021-01-06 | Disposition: A | Discharge status: Home / Self Care | Payer: 59 | Source: Ambulatory Visit | Attending: Urology | Admitting: Urology

## 2021-01-06 ENCOUNTER — Other Ambulatory Visit: Payer: Self-pay

## 2021-01-06 DIAGNOSIS — N200 Calculus of kidney: Secondary | ICD-10-CM | POA: Diagnosis not present

## 2021-01-06 DIAGNOSIS — N281 Cyst of kidney, acquired: Secondary | ICD-10-CM | POA: Diagnosis not present

## 2021-01-06 DIAGNOSIS — K760 Fatty (change of) liver, not elsewhere classified: Secondary | ICD-10-CM | POA: Diagnosis not present

## 2021-01-06 DIAGNOSIS — N201 Calculus of ureter: Secondary | ICD-10-CM | POA: Insufficient documentation

## 2021-01-07 DIAGNOSIS — R7401 Elevation of levels of liver transaminase levels: Secondary | ICD-10-CM

## 2021-01-15 ENCOUNTER — Other Ambulatory Visit: Payer: Self-pay

## 2021-01-15 ENCOUNTER — Ambulatory Visit (INDEPENDENT_AMBULATORY_CARE_PROVIDER_SITE_OTHER): Payer: 59 | Admitting: Urology

## 2021-01-15 VITALS — BP 138/90 | HR 78 | Wt 229.0 lb

## 2021-01-15 DIAGNOSIS — N281 Cyst of kidney, acquired: Secondary | ICD-10-CM | POA: Diagnosis not present

## 2021-01-15 DIAGNOSIS — Z3009 Encounter for other general counseling and advice on contraception: Secondary | ICD-10-CM

## 2021-01-15 DIAGNOSIS — N200 Calculus of kidney: Secondary | ICD-10-CM | POA: Diagnosis not present

## 2021-01-15 MED ORDER — DIAZEPAM 10 MG PO TABS
10.0000 mg | ORAL_TABLET | Freq: Once | ORAL | 0 refills | Status: AC
  Filled 2021-01-15 – 2021-02-20 (×2): qty 1, 1d supply, fill #0

## 2021-01-15 NOTE — Progress Notes (Signed)
01/15/2021 9:18 AM   Aaron Malone 1975-12-31 [id number]  Referring provider: Jinny Sanders, MD [address] [place],  [place] [address]  Chief Complaint  Patient presents with  . Nephrolithiasis    HPI: 45 year old male with history of nephrolithiasis who returns today for follow-up.  He initially underwent ESWL and he failed to pass obstructing ureteral calculi spontaneously.  Ultimately, he underwent right ureteroscopy on 12/16/2020 at which time all stones were treated.  Stone analysis consistent with primarily calcium oxalate monohydrate 95%, 5% hypo-Apatate.  Has had several stone events in the past but no more than once per year.  He did undergo a metabolic evaluation in the remote past as well and told that his urinary calcium levels were high.  He has been on chronic indapamide.  Follow-up renal ultrasound shows no residual stone burden bilaterally.  He does have a known complicated right lower pole cyst of which he has been aware since at least 2005.  The cyst measures 5.8 cm and contains internal echogenicity.  He denies any ongoing flank pain or urinary issues.  He does have some heaviness or "awareness of his right kidney in which he may have had even before the stone episode.  He wonders if this was related to the cyst.  Notably, in comparison to previous imaging dating back to 2008, this lesion has grown fairly significantly, measured 1.2 cm at the time.  He is scheduled for vasectomy in the near future and has some questions about technique and follow-up today.    PMH: Past Medical History:  Diagnosis Date  . Carpal tunnel syndrome    bilateral  . COVID-19 08/2020  . Gallstones 2012  . History of kidney stones   . Hyperlipidemia   . Major depressive disorder, single episode, moderate ([place])   . Obesity   . Sleep apnea    uses a CPAP    Surgical History: Past Surgical History:  Procedure Laterality Date  . CARPAL TUNNEL RELEASE Bilateral  08/24/2014   Procedure: RIGHT LIMITED OPEN CARPAL TUNNEL RELEASE, LEFT CARPAL  TUNNEL INJECTION;  Surgeon: Roseanne Kaufman, MD;  Location: [facility];  Service: Orthopedics;  Laterality: Bilateral;  . CARPAL TUNNEL RELEASE Left 06/20/2015   Procedure: LEFT CARPAL TUNNEL RELEASE;  Surgeon: Roseanne Kaufman, MD;  Location: [facility];  Service: Orthopedics;  Laterality: Left;  . CHOLECYSTECTOMY  2012  . COLONOSCOPY    . CYSTOSCOPY W/ RETROGRADES Bilateral 12/16/2020   Procedure: CYSTOSCOPY WITH RETROGRADE PYELOGRAM;  Surgeon: Hollice Espy, MD;  Location: [facility];  Service: Urology;  Laterality: Bilateral;  . CYSTOSCOPY/URETEROSCOPY/HOLMIUM LASER/STENT PLACEMENT Right 12/16/2020   Procedure: CYSTOSCOPY/URETEROSCOPY/HOLMIUM LASER/STENT PLACEMENT;  Surgeon: Hollice Espy, MD;  Location: [facility];  Service: Urology;  Laterality: Right;  . EXTRACORPOREAL SHOCK WAVE LITHOTRIPSY Right 10/31/2020   Procedure: EXTRACORPOREAL SHOCK WAVE LITHOTRIPSY (ESWL);  Surgeon: Hollice Espy, MD;  Location: [facility];  Service: Urology;  Laterality: Right;  . INGUINAL HERNIA REPAIR Bilateral 1983  . LITHOTRIPSY  12/2003, 09/2006  . TRIGGER FINGER RELEASE     thumb    Home Medications:  Allergies as of 01/15/2021   No Known Allergies     Medication List       Accurate as of January 15, 2021  9:18 AM. If you have any questions, ask your nurse or doctor.        STOP taking these medications   ondansetron 4 MG disintegrating tablet Commonly known as: ZOFRAN-ODT Stopped by: Caryl Pina  Erlene Quan, MD   oxybutynin 5 MG tablet Commonly known as: DITROPAN Stopped by: Hollice Espy, MD   oxyCODONE-acetaminophen 5-325 MG tablet Commonly known as: PERCOCET/ROXICET Stopped by: Hollice Espy, MD   tamsulosin 0.4 MG Caps capsule Commonly known as: FLOMAX Stopped by: Hollice Espy, MD     TAKE these medications   acetaminophen 500 MG tablet Commonly known as: TYLENOL Take 500 mg by mouth  every 6 (six) hours as needed.   cholecalciferol 25 MCG (1000 UNIT) tablet Commonly known as: VITAMIN D Take 1,000 Units by mouth daily.   desvenlafaxine 100 MG 24 hr tablet Commonly known as: PRISTIQ Take 1 tablet (100 mg total) by mouth daily.   Dexcom G6 Transmitter Misc Check blood sugar as recommended.   diazepam 10 MG tablet Commonly known as: Valium Take 1 tablet (10 mg total) by mouth once for 1 dose. Take 1 hour prior to procedure. Please have a driver available. Started by: Hollice Espy, MD   fluticasone 50 MCG/ACT nasal spray Commonly known as: FLONASE USE 2 SPRAYS IN EACH NOSTIL ONCE A DAY What changed:   how much to take  how to take this  when to take this   FreeStyle Libre 2 Reader [facility] Use to check blood sugar continuous as instructed.   FreeStyle Libre 2 Sensor Misc USE TO CHECK BLOOD SUGAR CONTINUOUS AS DIRECTED.   FREESTYLE TEST STRIPS test strip Generic drug: glucose blood Use as instructed   glucose monitoring kit monitoring kit 1 each by Does not apply route as needed for other.   ibuprofen 600 MG tablet Commonly known as: ADVIL Take 400 mg by mouth 2 (two) times daily.   indapamide 1.25 MG tablet Commonly known as: LOZOL TAKE 1 TABLET BY MOUTH EVERY MORNING   ipratropium 0.03 % nasal spray Commonly known as: ATROVENT PLACE 2 SPRAYS INTO BOTH NOSTRILS EVERY 12 HOURS. What changed: See the new instructions.   Moderna COVID-19 Vaccine 100 MCG/0.5ML injection Generic drug: COVID-19 mRNA vaccine (Moderna) USE AS DIRECTED   propranolol 10 MG tablet Commonly known as: INDERAL TAKE 1 TABLET BY MOUTH ONCE DAILY AS NEEDED What changed:   how much to take  reasons to take this   pseudoephedrine 60 MG tablet Commonly known as: SUDAFED Take 60 mg by mouth every 4 (four) hours as needed for congestion.   Turmeric 450 MG Caps Take 450 mg by mouth daily.       Allergies: No Known Allergies  Family History: Family History   Problem Relation Age of Onset  . Hyperlipidemia Mother   . Hypertension Mother   . Irritable bowel syndrome Mother   . Colon polyps Mother        x lots  . Polycystic ovary syndrome Sister   . Colon cancer Maternal Grandmother        dx in her 10's or 30's  . Lung cancer Maternal Grandmother   . Heart failure Maternal Grandfather   . Heart disease Maternal Grandfather   . Prostate cancer Maternal Grandfather   . Multiple myeloma Paternal Grandfather   . Diabetes Paternal Grandmother   . Colon polyps Maternal Uncle     Social History:  reports that he has never smoked. He has never used smokeless tobacco. He reports current alcohol use. He reports that he does not use drugs.   Physical Exam: BP 138/90   Pulse 78   Wt 229 lb (103.9 kg)   BMI 35.87 kg/m   Constitutional:  Alert and oriented, No  acute distress. HEENT: [place] AT, moist mucus membranes.  Trachea midline, no masses. Cardiovascular: No clubbing, cyanosis, or edema. Respiratory: Normal respiratory effort, no increased work of breathing. Skin: No rashes, bruises or suspicious lesions. Neurologic: Grossly intact, no focal deficits, moving all 4 extremities. Psychiatric: Normal mood and affect.  Laboratory Data: Lab Results  Component Value Date   WBC 10.9 (H) 11/24/2020   HGB 14.1 11/24/2020   HCT 40.7 11/24/2020   MCV 94.0 11/24/2020   PLT 354 11/24/2020    Lab Results  Component Value Date   CREATININE 1.13 11/24/2020   Pertinent Imaging:  Ultrasound renal complete  Narrative CLINICAL DATA:  Nephrolithiasis  EXAM: RENAL / URINARY TRACT ULTRASOUND COMPLETE  COMPARISON:  Renal stone CT 11/24/2020  FINDINGS: Right Kidney:  Renal measurements: 10.9 x 5.7 x 6.0 cm = volume: 196 mL. Echogenicity within normal limits. No mass or hydronephrosis visualized. 5.8 cm complex/complicated cyst again seen at the lower pole the right kidney. The cyst contains internal echogenicities.  Left Kidney:  Renal  measurements: 11.7 x 6.0 x 5.9 cm = volume: 214 mL. Echogenicity within normal limits. No mass or hydronephrosis visualized. 3.5 cm simple cyst present in the upper pole.  Bladder:  Appears normal for degree of bladder distention.  Other:  Diffuse increased echogenicity of the visualized portions of the hepatic parenchyma are a nonspecific indicator of hepatocellular dysfunction, most commonly steatosis.  IMPRESSION: 1. No hydronephrosis. 2. No sonographically evident calculi. 3. Complex/complicated cyst at the lower pole the right kidney. As recommended on prior renal stone protocol CT, further evaluation with MRI should be performed to better characterize this lesion.  These results will be called to the ordering clinician or representative by the Radiologist Assistant, and communication documented in the PACS or [employer].   Electronically Signed By: Miachel Roux M.D. On: 01/07/2021 12:08  Assessment & Plan:    1. Acquired cyst of kidney Enlarging right lower pole cyst with increased complexity  At this point, recommend abdominal MRI with and without contrast for more definitive characterization.  We discussed the Bosniak classification system today. - MR Abdomen W Wo Contrast; Future  2. Nephrolithiasis Stone analysis reviewed  We discussed general stone prevention techniques including drinking plenty water with goal of producing 2.5 L urine daily, increased citric acid intake, avoidance of high oxalate containing foods, and decreased salt intake.  Information about dietary recommendations given today.  Given that he has been chronically on indapamide with a history of hypercalciuria, may be beneficial to consider repeat [id number] urine metabolic evaluation.  We discussed the procedure for this today and he is interested in pursuing this.  3. Vasectomy evaluation All additional questions were answered today  He does desire preprocedural Valium which  was prescribed for anxiolysis, will have a driver if he takes this medication.  Consent signed.   F/u for vasectomy and then  follow-up for MRI/[facility] results  Hollice Espy, MD  [facility] [address] [place], [place] [address] [phone]

## 2021-01-15 NOTE — Patient Instructions (Signed)

## 2021-01-22 NOTE — Addendum Note (Signed)
Addended by: Eliezer Lofts E on: 01/22/2021 04:59 PM   Modules accepted: Orders

## 2021-01-23 ENCOUNTER — Ambulatory Visit (HOSPITAL_COMMUNITY)
Admission: RE | Admit: 2021-01-23 | Discharge: 2021-01-23 | Disposition: A | Discharge status: Home / Self Care | Payer: 59 | Source: Ambulatory Visit | Attending: Urology | Admitting: Urology

## 2021-01-23 ENCOUNTER — Telehealth: Payer: Self-pay | Admitting: *Deleted

## 2021-01-23 ENCOUNTER — Other Ambulatory Visit: Payer: Self-pay

## 2021-01-23 DIAGNOSIS — N281 Cyst of kidney, acquired: Secondary | ICD-10-CM | POA: Insufficient documentation

## 2021-01-23 DIAGNOSIS — K760 Fatty (change of) liver, not elsewhere classified: Secondary | ICD-10-CM | POA: Diagnosis not present

## 2021-01-23 DIAGNOSIS — Z9049 Acquired absence of other specified parts of digestive tract: Secondary | ICD-10-CM | POA: Diagnosis not present

## 2021-01-23 MED ORDER — GADOBUTROL 1 MMOL/ML IV SOLN
10.0000 mL | Freq: Once | INTRAVENOUS | Status: AC | PRN
  Administered 2021-01-23: 10 mL via INTRAVENOUS

## 2021-01-23 NOTE — Telephone Encounter (Addendum)
Patient informed-voiced understanding  ----- Message from Hollice Espy, MD sent at 01/23/2021  4:27 PM EDT ----- Please let this patient know that his cyst looks fine.  It looks like a hemorrhagic or proteinaceous cyst which is completely benign as anticipated.  This is great news.  No further follow-up is needed for this.  Hollice Espy, MD

## 2021-01-29 ENCOUNTER — Other Ambulatory Visit: Payer: Self-pay | Admitting: Family Medicine

## 2021-01-29 ENCOUNTER — Other Ambulatory Visit (HOSPITAL_COMMUNITY): Payer: Self-pay

## 2021-01-29 MED ORDER — PROPRANOLOL HCL 10 MG PO TABS
ORAL_TABLET | Freq: Every day | ORAL | 1 refills | Status: DC | PRN
  Filled 2021-01-29: qty 90, 90d supply, fill #0
  Filled 2021-12-29: qty 90, 90d supply, fill #1

## 2021-02-03 DIAGNOSIS — G4733 Obstructive sleep apnea (adult) (pediatric): Secondary | ICD-10-CM | POA: Diagnosis not present

## 2021-02-06 ENCOUNTER — Encounter: Payer: Self-pay | Admitting: Urology

## 2021-02-10 ENCOUNTER — Other Ambulatory Visit (HOSPITAL_COMMUNITY): Payer: Self-pay

## 2021-02-10 MED ORDER — CARESTART COVID-19 HOME TEST VI KIT
PACK | 0 refills | Status: DC
  Filled 2021-02-10: qty 4, 4d supply, fill #0

## 2021-02-20 ENCOUNTER — Other Ambulatory Visit: Payer: Self-pay

## 2021-02-24 ENCOUNTER — Encounter: Payer: Self-pay | Admitting: Urology

## 2021-02-25 ENCOUNTER — Other Ambulatory Visit (INDEPENDENT_AMBULATORY_CARE_PROVIDER_SITE_OTHER): Payer: 59

## 2021-02-25 ENCOUNTER — Other Ambulatory Visit: Payer: 59

## 2021-02-25 DIAGNOSIS — R7401 Elevation of levels of liver transaminase levels: Secondary | ICD-10-CM | POA: Diagnosis not present

## 2021-02-25 LAB — HEPATIC FUNCTION PANEL
ALT: 33 U/L (ref 0–53)
AST: 19 U/L (ref 0–37)
Albumin: 4.7 g/dL (ref 3.5–5.2)
Alkaline Phosphatase: 49 U/L (ref 39–117)
Bilirubin, Direct: 0 mg/dL (ref 0.0–0.3)
Total Bilirubin: 0.4 mg/dL (ref 0.2–1.2)
Total Protein: 7.5 g/dL (ref 6.0–8.3)

## 2021-02-26 LAB — HEPATITIS PANEL, ACUTE
Hep A IgM: NONREACTIVE
Hep B C IgM: NONREACTIVE
Hepatitis B Surface Ag: NONREACTIVE
Hepatitis C Ab: NONREACTIVE
SIGNAL TO CUT-OFF: 0 (ref <1.00)

## 2021-02-27 ENCOUNTER — Ambulatory Visit (INDEPENDENT_AMBULATORY_CARE_PROVIDER_SITE_OTHER): Payer: 59 | Admitting: Urology

## 2021-02-27 ENCOUNTER — Encounter: Payer: Self-pay | Admitting: Urology

## 2021-02-27 ENCOUNTER — Other Ambulatory Visit: Payer: Self-pay

## 2021-02-27 VITALS — BP 102/66 | HR 73 | Ht 67.0 in | Wt 214.0 lb

## 2021-02-27 DIAGNOSIS — Z9852 Vasectomy status: Secondary | ICD-10-CM

## 2021-02-27 DIAGNOSIS — Z3009 Encounter for other general counseling and advice on contraception: Secondary | ICD-10-CM

## 2021-02-27 NOTE — Progress Notes (Signed)
02/27/21  CC:  Chief Complaint  Patient presents with   VAS    HPI: 45 year old male who presents today for vasectomy.  Risk and benefits reviewed.  Timeout was performed.  All questions answered.  Blood pressure 102/66, pulse 73, height 5\' 7"  (1.702 m), weight 214 lb (97.1 kg). NED. A&Ox3.   No respiratory distress   Abd soft, NT, ND Normal external genitalia with patent urethral meatus   Bilateral Vasectomy Procedure  Pre-Procedure: - Patient's scrotum was prepped and draped for vasectomy. - The vas was palpated through the scrotal skin on the left. - 1% Xylocaine was injected into the skin and surrounding tissue for placement  - In a similar manner, the vas on the right was identified, anesthetized, and stabilized.  Procedure: - A #11 blade was used to make a small stab incision in the skin overlying the vas - The left vas was isolated and brought up through the incision exposing that structure. - Bleeding points were cauterized as they occurred. - The vas was free from the surrounding structures and brought to the view. - A segment was positioned for placement with a hemostat. - A second hemostat was placed and a small segment between the two hemostats and was removed for inspection. - Each end of the transected vas lumen was fulgurated/ obliterated using needlepoint electrocautery -A fascial interposition was performed on testicular end of the vas using #3-0 chromic suture -The same procedure was performed on the right. - A single suture of #3-0 chromic catgut was used to close each lateral scrotal skin incision - A dressing was applied.  Post-Procedure: - Patient was instructed in care of the operative area - A specimen is to be delivered in 12 weeks   -Another form of contraception is to be used until post vasectomy semen analysis  Hollice Espy, MD

## 2021-02-27 NOTE — Patient Instructions (Signed)

## 2021-03-04 ENCOUNTER — Ambulatory Visit: Payer: 59 | Admitting: Family Medicine

## 2021-03-04 ENCOUNTER — Encounter: Payer: Self-pay | Admitting: Family Medicine

## 2021-03-04 ENCOUNTER — Other Ambulatory Visit: Payer: Self-pay

## 2021-03-04 VITALS — BP 100/70 | HR 78 | Temp 97.0°F | Ht 66.0 in | Wt 214.0 lb

## 2021-03-04 DIAGNOSIS — K760 Fatty (change of) liver, not elsewhere classified: Secondary | ICD-10-CM | POA: Diagnosis not present

## 2021-03-04 DIAGNOSIS — E66811 Obesity, class 1: Secondary | ICD-10-CM

## 2021-03-04 DIAGNOSIS — E669 Obesity, unspecified: Secondary | ICD-10-CM | POA: Diagnosis not present

## 2021-03-04 DIAGNOSIS — R7401 Elevation of levels of liver transaminase levels: Secondary | ICD-10-CM | POA: Diagnosis not present

## 2021-03-04 MED ORDER — SEMAGLUTIDE(0.25 OR 0.5MG/DOS) 2 MG/1.5ML ~~LOC~~ SOPN
0.2500 mg | PEN_INJECTOR | SUBCUTANEOUS | 3 refills | Status: DC
Start: 2021-03-04 — End: 2021-10-10
  Filled 2021-03-04: qty 1.5, 56d supply, fill #0
  Filled 2021-03-17: qty 6, 224d supply, fill #0
  Filled 2021-03-17: qty 4.5, 84d supply, fill #0
  Filled 2021-05-31: qty 4.5, 84d supply, fill #1
  Filled 2021-07-14 – 2021-08-12 (×2): qty 4.5, 84d supply, fill #2

## 2021-03-04 MED ORDER — FREESTYLE LIBRE 2 SENSOR MISC
5 refills | Status: AC
  Filled 2021-03-04: qty 2, 28d supply, fill #0

## 2021-03-04 NOTE — Progress Notes (Signed)
Patient ID: Aaron Malone, male    DOB: Dec 25, 1975, 45 y.o.   MRN: [id number]  This visit was conducted in person.  BP 100/70   Pulse 78   Temp (!) 97 F (36.1 C) (Temporal)   Ht _0  (1.676 m)   Wt 214 lb (97.1 kg)   SpO2 96%   BMI 34.54 kg/m    CC: Chief Complaint  Patient presents with   Weight Loss   Hepatic Steatosis    Subjective:   HPI: Aaron Malone is a 45 y.o. male presenting on 03/04/2021 for Weight Loss and Hepatic Steatosis  Weight management   Elevated ALT  on labs 5 months ago 72, improved to 52 3 months ago and now 1 week ago in nml range at 33   Normal hepatitis panel. Fatty liver noted on CT as well.  MRA abdomen done for renal cyst on 01/23/2021 showed showed: Hepatic steatosis. No mass or other parenchymal abnormality identified. Status post cholecystectomy. No biliary ductal dilatation.   Down from 242  in 09/2020. Wt Readings from Last 3 Encounters:  03/04/21 214 lb (97.1 kg)  02/27/21 214 lb (97.1 kg)  01/15/21 229 lb (103.9 kg)    He has changed diet to lower carbs, avoiding late night snacking. Increase water intake. Moderate fat intake.. minimal fried foods.  Occ exercise.  He is interested in considering GLP1 as  weight management.     The 10-year ASCVD risk score Mikey Bussing DC Brooke Bonito., et al., 2013) is: 1.2%   Values used to calculate the score:     Age: 44 years     Sex: Male     Is Non-Hispanic African American: No     Diabetic: No     Tobacco smoker: No     Systolic Blood Pressure: 902 mmHg     Is BP treated: No     HDL Cholesterol: 52.5 mg/dL     Total Cholesterol: 233 mg/dL   Relevant past medical, surgical, family and social history reviewed and updated as indicated. Interim medical history since our last visit reviewed. Allergies and medications reviewed and updated. Outpatient Medications Prior to Visit  Medication Sig Dispense Refill   acetaminophen (TYLENOL) 500 MG tablet Take 500 mg by mouth every 6 (six) hours as  needed.     cholecalciferol (VITAMIN D) 25 MCG (1000 UNIT) tablet Take 1,000 Units by mouth daily.     Continuous Blood Gluc Receiver (FREESTYLE LIBRE 2 READER) DEVI Use to check blood sugar continuous as instructed. 1 each 0   Continuous Blood Gluc Sensor (FREESTYLE LIBRE 2 SENSOR) MISC USE TO CHECK BLOOD SUGAR CONTINUOUS AS DIRECTED. 1 each 5   Continuous Blood Gluc Transmit (DEXCOM G6 TRANSMITTER) MISC Check blood sugar as recommended. 1 each 0   COVID-19 At Home Antigen Test (CARESTART COVID-19 HOME TEST) KIT Use as directed 4 each 0   COVID-19 mRNA vaccine, Moderna, 100 MCG/0.5ML injection USE AS DIRECTED .25 mL 0   desvenlafaxine (PRISTIQ) 100 MG 24 hr tablet Take 1 tablet (100 mg total) by mouth daily. 90 tablet 2   fluticasone (FLONASE) 50 MCG/ACT nasal spray USE 2 SPRAYS IN EACH NOSTIL ONCE A DAY 48 g 3   glucose blood (FREESTYLE TEST STRIPS) test strip Use as instructed 100 each 3   glucose monitoring kit (FREESTYLE) monitoring kit 1 each by Does not apply route as needed for other. 1 each 0   ibuprofen (ADVIL,MOTRIN) 600 MG tablet Take 400 mg by  mouth 2 (two) times daily.     indapamide (LOZOL) 1.25 MG tablet TAKE 1 TABLET BY MOUTH EVERY MORNING 90 tablet 2   ipratropium (ATROVENT) 0.03 % nasal spray PLACE 2 SPRAYS INTO BOTH NOSTRILS EVERY 12 HOURS. (Patient taking differently: Place 2 sprays into both nostrils daily as needed for rhinitis.) 30 mL 11   propranolol (INDERAL) 10 MG tablet TAKE 1 TABLET BY MOUTH ONCE DAILY AS NEEDED 90 tablet 1   pseudoephedrine (SUDAFED) 60 MG tablet Take 60 mg by mouth every 4 (four) hours as needed for congestion.     Turmeric 450 MG CAPS Take 450 mg by mouth daily.     No facility-administered medications prior to visit.     Per HPI unless specifically indicated in ROS section below Review of Systems  Constitutional:  Negative for fatigue and fever.  HENT:  Negative for ear pain.   Eyes:  Negative for pain.  Respiratory:  Negative for cough and  shortness of breath.   Cardiovascular:  Negative for chest pain, palpitations and leg swelling.  Gastrointestinal:  Negative for abdominal pain.  Genitourinary:  Negative for dysuria.  Musculoskeletal:  Negative for arthralgias.  Neurological:  Negative for syncope, light-headedness and headaches.  Psychiatric/Behavioral:  Negative for dysphoric mood.   Objective:  BP 100/70   Pulse 78   Temp (!) 97 F (36.1 C) (Temporal)   Ht _0  (1.676 m)   Wt 214 lb (97.1 kg)   SpO2 96%   BMI 34.54 kg/m   Wt Readings from Last 3 Encounters:  03/04/21 214 lb (97.1 kg)  02/27/21 214 lb (97.1 kg)  01/15/21 229 lb (103.9 kg)      Physical Exam Constitutional:      Appearance: He is well-developed. He is obese.  HENT:     Head: Normocephalic.     Right Ear: Hearing normal.     Left Ear: Hearing normal.     Nose: Nose normal.  Neck:     Thyroid: No thyroid mass or thyromegaly.     Vascular: No carotid bruit.     Trachea: Trachea normal.  Cardiovascular:     Rate and Rhythm: Normal rate and regular rhythm.     Pulses: Normal pulses.     Heart sounds: Heart sounds not distant. No murmur heard.   No friction rub. No gallop.     Comments: No peripheral edema Pulmonary:     Effort: Pulmonary effort is normal. No respiratory distress.     Breath sounds: Normal breath sounds.  Skin:    General: Skin is warm and dry.     Findings: No rash.  Psychiatric:        Speech: Speech normal.        Behavior: Behavior normal.        Thought Content: Thought content normal.      Results for orders placed or performed in visit on 02/25/21  Hepatitis panel, acute  Result Value Ref Range   Hep A IgM NON-REACTIVE NON-REACTIVE   Hepatitis B Surface Ag NON-REACTIVE NON-REACTIVE   Hep B C IgM NON-REACTIVE NON-REACTIVE   Hepatitis C Ab NON-REACTIVE NON-REACTIVE   SIGNAL TO CUT-OFF 0.00 <1.00  Hepatic Function Panel  Result Value Ref Range   Total Bilirubin 0.4 0.2 - 1.2 mg/dL   Bilirubin, Direct  0.0 0.0 - 0.3 mg/dL   Alkaline Phosphatase 49 39 - 117 U/L   AST 19 0 - 37 U/L   ALT 33 0 - 53 U/L  Total Protein 7.5 6.0 - 8.3 g/dL   Albumin 4.7 3.5 - 5.2 g/dL    This visit occurred during the SARS-CoV-2 public health emergency.  Safety protocols were in place, including screening questions prior to the visit, additional usage of staff PPE, and extensive cleaning of exam room while observing appropriate contact time as indicated for disinfecting solutions.   COVID 19 screen:  No recent travel or known exposure to COVID19 The patient denies respiratory symptoms of COVID 19 at this time. The importance of social distancing was discussed today.   Assessment and Plan    Problem List Items Addressed This Visit     Elevated transaminase level - Primary    Improved with weight loss ans healthy  Low fat diet.      Fatty liver    Fatty liver noted on CT.  MRA abdomen done for renal cyst on 01/23/2021 showed showed: Hepatic steatosis. No mass or other parenchymal abnormality identified. Status post cholecystectomy. No biliary ductal dilatation.      Obesity (BMI 30.0-34.9)    Encouraged exercise, weight loss, healthy eating habits. Consider trial of  Ozempic for weight management. Keep up great work on dietary changes.        Eliezer Lofts, MD

## 2021-03-04 NOTE — Patient Instructions (Addendum)
Consider trial of  Ozempic for weight management. Keep up great work on dietary changes.

## 2021-03-17 ENCOUNTER — Other Ambulatory Visit: Payer: Self-pay

## 2021-03-25 ENCOUNTER — Encounter: Payer: Self-pay | Admitting: Urology

## 2021-03-27 ENCOUNTER — Other Ambulatory Visit (HOSPITAL_COMMUNITY): Payer: Self-pay

## 2021-03-31 ENCOUNTER — Other Ambulatory Visit: Payer: Self-pay

## 2021-04-02 ENCOUNTER — Other Ambulatory Visit: Payer: Self-pay

## 2021-04-05 NOTE — Progress Notes (Signed)
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 Machine is 43 years old. He self-paid for it so could apply to replace any time. He will discuss with [facility].  Denies other health issues of concern. Will get flu vax through work.  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 Uses Travel machine some.Sleeps better with CPAP and prefers the humidifier of main machine over disc in [employer] travel machine.  Dealing with kidney stones. CXR 07/12/21-  IMPRESSION:  Suggestion of bronchitis, especially on the left.  No focal  infiltrate identified.   ROS-see HPI   + = positive Constitutional:   + weight loss, night sweats, fevers, chills, fatigue, lassitude. HEENT:    +headaches, difficulty swallowing, tooth/dental problems, sore throat,       sneezing, itching, ear ache, nasal congestion, post nasal drip, snoring CV:    chest pain, orthopnea, PND, swelling in lower extremities, anasarca,                                                        dizziness, palpitations Resp:   shortness of breath with exertion or at rest.                productive cough,   non-productive cough, coughing up of blood.              change in color of mucus.   wheezing.   Skin:    rash or lesions. GI:  No-   heartburn, indigestion, abdominal pain, nausea, vomiting, diarrhea,                 change in bowel habits, loss of appetite GU: dysuria, change in color of urine, no urgency or frequency.   flank pain. MS:   joint pain, stiffness, decreased range of motion, back pain. Neuro-     nothing unusual Psych:  change in mood or affect.  +depression or anxiety.   memory loss.  OBJ- Physical Exam General- Alert, Oriented, Affect-appropriate, Distress- none acute,  + overweight Skin- rash-none, lesions- none, excoriation- none Lymphadenopathy- none Head- atraumatic            Eyes- Gross vision intact, PERRLA, conjunctivae and secretions clear            Ears- Hearing, canals-normal            Nose- Clear, no-Septal dev, mucus, polyps, erosion, perforation             Throat- Mallampati III , mucosa clear , drainage- none, tonsils- atrophic Neck- flexible , trachea midline, no stridor , thyroid nl, carotid no bruit Chest - symmetrical excursion , unlabored           Heart/CV- RRR , no murmur , no gallop  ,  no rub, nl s1 s2                           - JVD- none , edema- none, stasis changes- none, varices- none           Lung- clear to P&A, wheeze- none, cough- none , dullness-none, rub- none           Chest wall-  Abd-  Br/ Gen/ Rectal- Not done, not indicated Extrem- cyanosis- none, clubbing, none, atrophy- none, strength- nl Neuro- grossly intact to observation

## 2021-04-07 ENCOUNTER — Encounter: Payer: Self-pay | Admitting: Internal Medicine

## 2021-04-07 ENCOUNTER — Ambulatory Visit (INDEPENDENT_AMBULATORY_CARE_PROVIDER_SITE_OTHER): Payer: 59 | Admitting: Internal Medicine

## 2021-04-07 ENCOUNTER — Other Ambulatory Visit: Payer: Self-pay

## 2021-04-07 ENCOUNTER — Other Ambulatory Visit (HOSPITAL_COMMUNITY): Payer: Self-pay

## 2021-04-07 DIAGNOSIS — G4733 Obstructive sleep apnea (adult) (pediatric): Secondary | ICD-10-CM

## 2021-04-07 DIAGNOSIS — Z87442 Personal history of urinary calculi: Secondary | ICD-10-CM

## 2021-04-07 NOTE — Patient Instructions (Signed)
Order- DME [facility]- please replace old CPAP machine with AirSense 11 AutoSet when available. Auto 5-20, mask of choice, humidifier, supplies, AirView/ card  Please call if we can help

## 2021-04-07 NOTE — Addendum Note (Signed)
Addended by: Zackery Barefoot on: 04/07/2021 11:51 AM   Modules accepted: Orders

## 2021-04-07 NOTE — Assessment & Plan Note (Signed)
Managing with Urology

## 2021-04-07 NOTE — Assessment & Plan Note (Signed)
Benefits from CPAP with good compliance and control Discussed replacing old machine Plan- replace with AirSense 11  Auto 5-20

## 2021-04-08 ENCOUNTER — Other Ambulatory Visit (HOSPITAL_COMMUNITY): Payer: Self-pay

## 2021-04-08 MED FILL — Fluticasone Propionate Nasal Susp 50 MCG/ACT: NASAL | Qty: 48 | Fill #0 | Status: CN

## 2021-04-09 ENCOUNTER — Other Ambulatory Visit (HOSPITAL_COMMUNITY): Payer: Self-pay

## 2021-04-10 ENCOUNTER — Other Ambulatory Visit (HOSPITAL_COMMUNITY): Payer: Self-pay

## 2021-04-10 ENCOUNTER — Ambulatory Visit: Payer: 59 | Admitting: Internal Medicine

## 2021-04-10 MED FILL — Fluticasone Propionate Nasal Susp 50 MCG/ACT: NASAL | 90 days supply | Qty: 48 | Fill #0 | Status: CN

## 2021-04-11 ENCOUNTER — Other Ambulatory Visit (HOSPITAL_COMMUNITY): Payer: Self-pay

## 2021-04-11 MED FILL — Fluticasone Propionate Nasal Susp 50 MCG/ACT: NASAL | 90 days supply | Qty: 48 | Fill #0 | Status: CN

## 2021-04-14 ENCOUNTER — Other Ambulatory Visit: Payer: Self-pay

## 2021-04-14 ENCOUNTER — Other Ambulatory Visit: Payer: 59

## 2021-04-14 DIAGNOSIS — N200 Calculus of kidney: Secondary | ICD-10-CM | POA: Diagnosis not present

## 2021-04-22 ENCOUNTER — Other Ambulatory Visit: Payer: Self-pay | Admitting: Urology

## 2021-04-23 ENCOUNTER — Other Ambulatory Visit: Payer: Self-pay

## 2021-04-23 ENCOUNTER — Telehealth (INDEPENDENT_AMBULATORY_CARE_PROVIDER_SITE_OTHER): Payer: 59 | Admitting: Physician Assistant

## 2021-04-23 ENCOUNTER — Ambulatory Visit: Payer: Self-pay | Admitting: Urology

## 2021-04-23 DIAGNOSIS — R82994 Hypercalciuria: Secondary | ICD-10-CM

## 2021-04-23 NOTE — Progress Notes (Signed)
This service is provided via telemedicine   No vital signs collected/recorded due to the encounter was a telemedicine visit.     Patient consents to a telephone visit:  yes    Names of all persons participating in the telemedicine service and their role in the encounter:  Fonnie Jarvis, [place], [place]broah Loop, [facility]

## 2021-04-23 NOTE — Progress Notes (Signed)
Virtual Visit via Video Note  I connected with Aaron Malone on 04/23/21 at  9:30 AM EDT by a video enabled telemedicine application and verified that I am speaking with the correct person using two identifiers.  Location: Patient: Work in [place], [place] Provider: Clinic in [place], [place]   I discussed the limitations of evaluation and management by telemedicine and the availability of in person appointments. The patient expressed understanding and agreed to proceed.  History of Present Illness: Aaron Malone is a 45 y.o. male with PMH nephrolithiasis on 1.25 mg indapamide daily who presents today to discuss Litholink results.  He has no acute concerns today.  Patient has undergone 2 prior metabolic work-ups in [id number] and [id number].  His most recent results show significant variation and urinary creatinine excretion compared to prior.  Regardless, his results are notable for appropriate urine volume of 2.42 L, hypercalciuria, and hypercalcemia.  Per chart review, he does not have a history of hypercalcemia.   Observations/Objective: Asking appropriate questions, no apparent distress.  Assessment and Plan: 1. Hypercalciuria Litholink notable for hypercalciuria despite indapamide as well as hypercalcemia, though no apparent history of hypercalcemia per chart review.  I explained that I would like to repeat his serum calcium and obtain a PTH to rule out underlying hyperparathyroidism.  If positive, will refer to endocrinology.  If negative, recommend adjusting his pharmacotherapy to keep his hypercalciuria under better therapeutic control.  Patient is in agreement with this plan. - PTH, Intact and Calcium; Future   Follow Up Instructions: Return in 5 days (on 04/28/2021) for Lab visit for serum calcium and PTH.    I discussed the assessment and treatment plan with the patient. The patient was provided an opportunity to ask questions and all were answered. The patient agreed with the plan and  demonstrated an understanding of the instructions.   The patient was advised to call back or seek an in-person evaluation if the symptoms worsen or if the condition fails to improve as anticipated.  I provided 15 minutes of non-face-to-face time during this encounter.  Debroah Loop, PA-C

## 2021-04-24 ENCOUNTER — Other Ambulatory Visit (HOSPITAL_COMMUNITY): Payer: Self-pay

## 2021-04-28 ENCOUNTER — Other Ambulatory Visit: Payer: 59

## 2021-04-28 ENCOUNTER — Other Ambulatory Visit: Payer: Self-pay

## 2021-04-28 DIAGNOSIS — R82994 Hypercalciuria: Secondary | ICD-10-CM | POA: Diagnosis not present

## 2021-04-29 DIAGNOSIS — R7401 Elevation of levels of liver transaminase levels: Secondary | ICD-10-CM | POA: Insufficient documentation

## 2021-04-29 DIAGNOSIS — K760 Fatty (change of) liver, not elsewhere classified: Secondary | ICD-10-CM | POA: Insufficient documentation

## 2021-04-29 NOTE — Assessment & Plan Note (Signed)
Fatty liver noted on CT.  MRA abdomen done for renal cyst on 01/23/2021 showed showed: Hepatic steatosis. No mass or other parenchymal abnormality identified. Status post cholecystectomy. No biliary ductal dilatation.

## 2021-04-29 NOTE — Assessment & Plan Note (Signed)
Improved with weight loss ans healthy  Low fat diet.

## 2021-04-29 NOTE — Assessment & Plan Note (Signed)
Encouraged exercise, weight loss, healthy eating habits. Consider trial of  Ozempic for weight management. Keep up great work on dietary changes.

## 2021-04-30 LAB — PTH, INTACT AND CALCIUM
Calcium: 9.7 mg/dL (ref 8.7–10.2)
PTH: 23 pg/mL (ref 15–65)

## 2021-05-01 ENCOUNTER — Other Ambulatory Visit: Payer: Self-pay | Admitting: Physician Assistant

## 2021-05-01 ENCOUNTER — Other Ambulatory Visit (HOSPITAL_COMMUNITY): Payer: Self-pay

## 2021-05-01 MED ORDER — INDAPAMIDE 2.5 MG PO TABS
2.5000 mg | ORAL_TABLET | Freq: Every day | ORAL | 3 refills | Status: DC
  Filled 2021-05-01: qty 90, 90d supply, fill #0
  Filled 2021-07-23: qty 90, 90d supply, fill #1
  Filled 2021-10-21: qty 90, 90d supply, fill #2
  Filled 2022-01-19: qty 90, 90d supply, fill #3

## 2021-05-02 ENCOUNTER — Other Ambulatory Visit (HOSPITAL_COMMUNITY): Payer: Self-pay

## 2021-05-02 MED ORDER — CARESTART COVID-19 HOME TEST VI KIT
PACK | 0 refills | Status: DC
  Filled 2021-05-02: qty 4, 4d supply, fill #0

## 2021-05-05 DIAGNOSIS — G4733 Obstructive sleep apnea (adult) (pediatric): Secondary | ICD-10-CM | POA: Diagnosis not present

## 2021-05-21 ENCOUNTER — Encounter: Payer: Self-pay | Admitting: Urology

## 2021-05-23 IMAGING — CR DG ABDOMEN 1V
1 series · 2 of 2 positions shown · non-contrast
Comparison: 01/01/2016

CLINICAL DATA: Right flank pain, right lower quadrant pain

EXAM:
ABDOMEN - 1 VIEW

[Series 1: dg abd 1 view · 0.14mm/px · 2 of 2 slices shown]
[im 1/2]
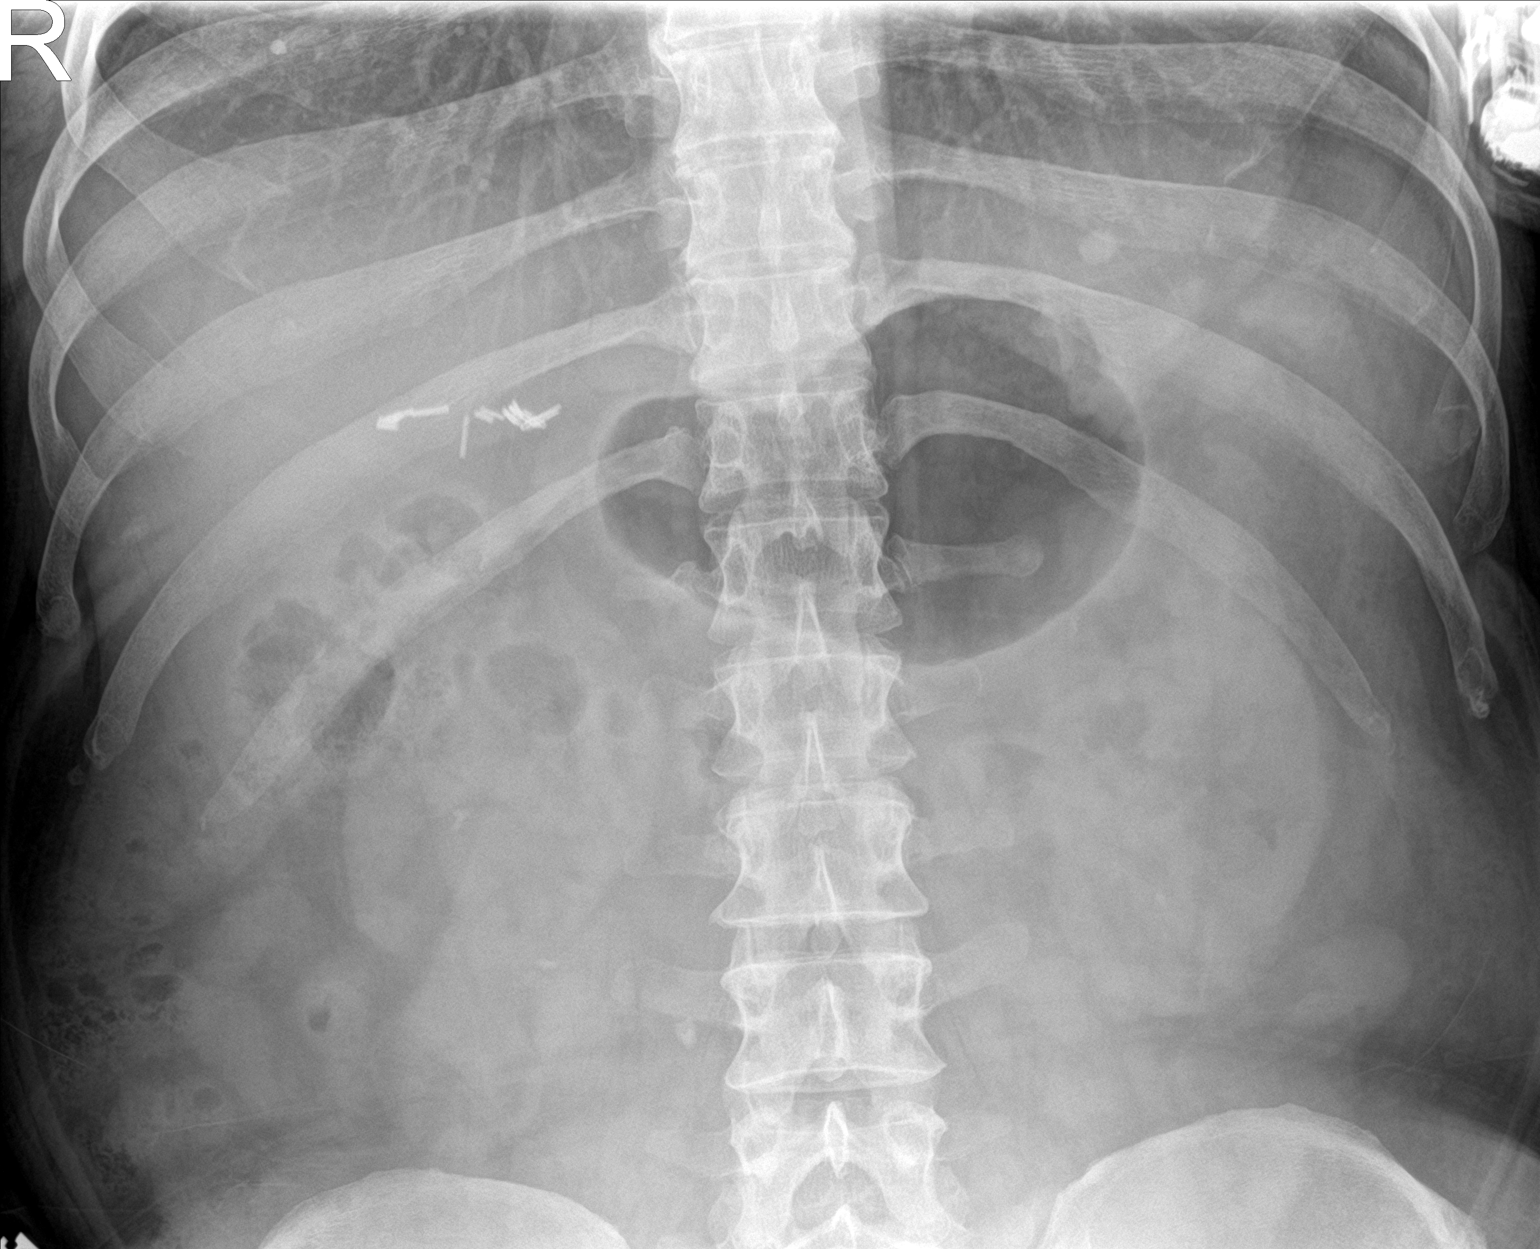
[im 2/2]
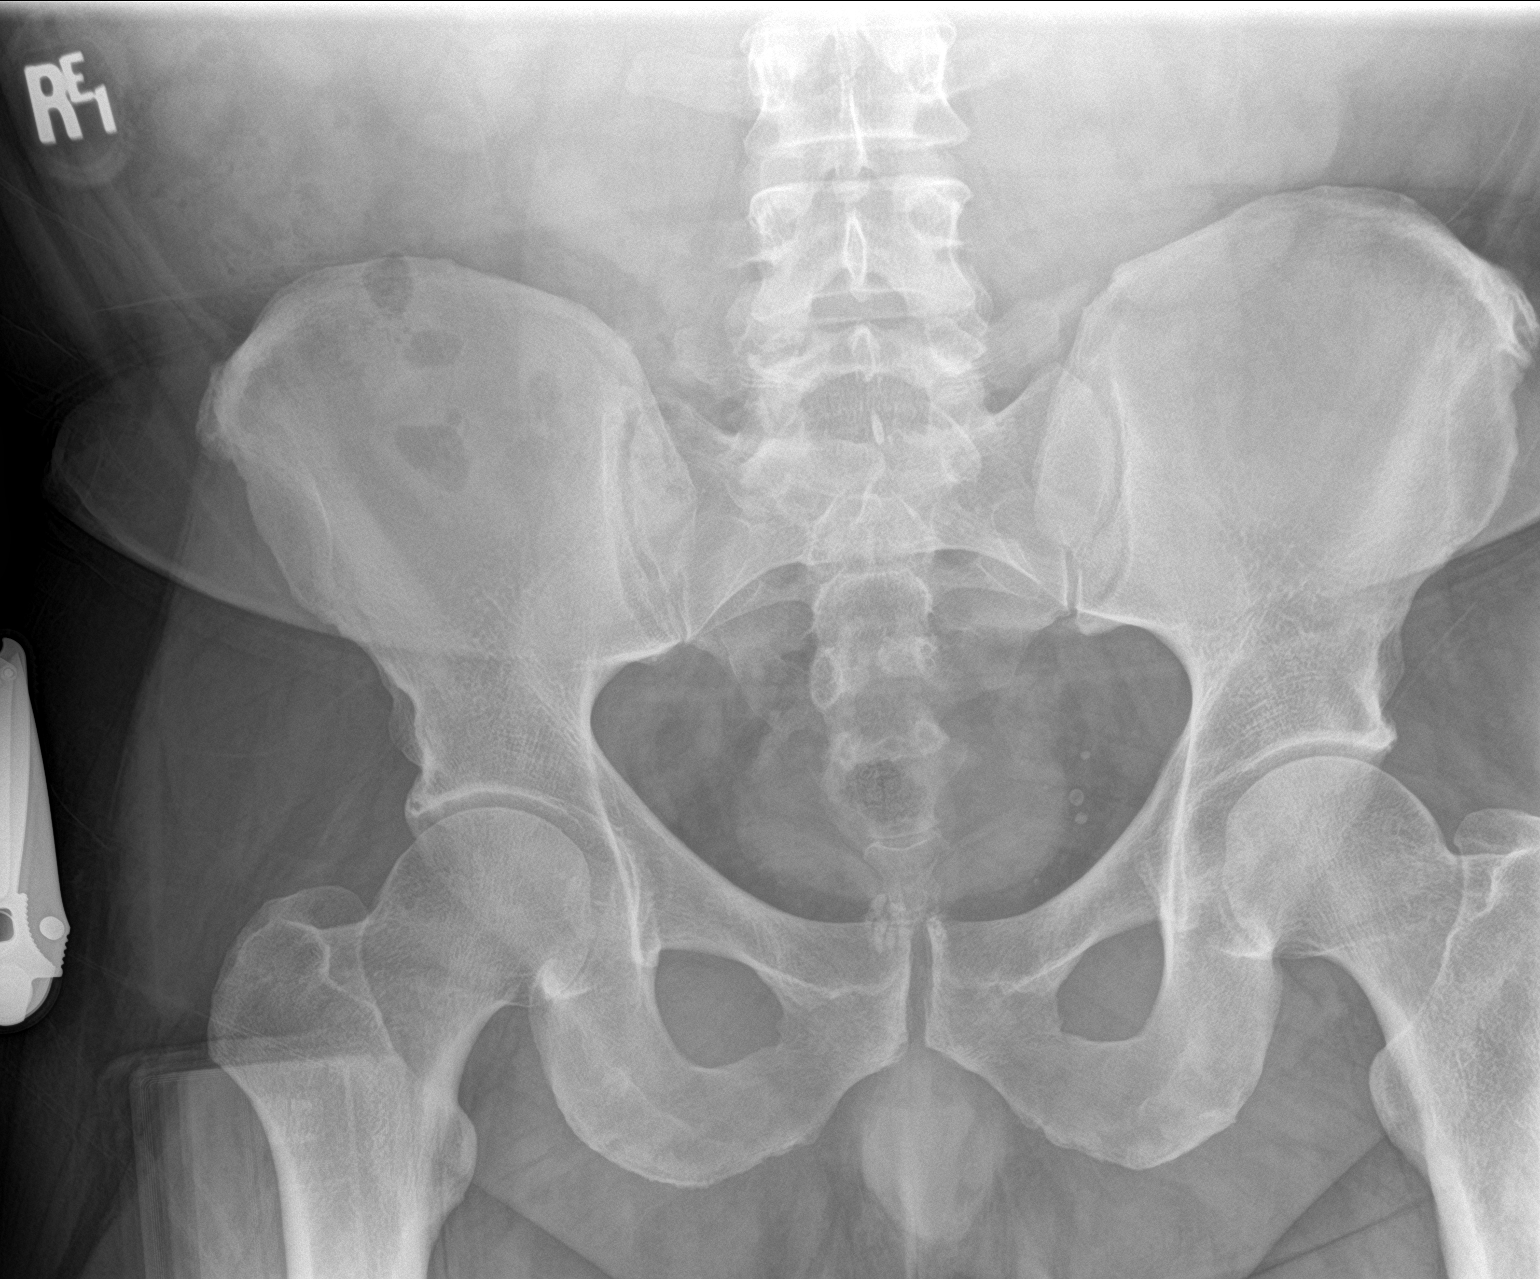

[2 of 2 positions shown; findings below may reference images not displayed]

FINDINGS: Multiple punctate 3-4 mm calcifications project over the bilateral
renal shadows compatible with nephrolithiasis. There is an
additional calcification projecting just below the right L3
transverse process, which could potentially reflect a right ureteral
calculus. Nonobstructive bowel gas pattern. Cholecystectomy clips.
Intact osseous structures.
IMPRESSION: 1. Bilateral nephrolithiasis.
2. Additional calcification projecting just below the right L3
transverse process, which could potentially reflect a right ureteral
calculus. Correlate with symptoms and further imaging, as clinically
indicated.

## 2021-05-25 IMAGING — CR DG ABDOMEN 1V
1 series · 2 of 2 positions shown · non-contrast
Comparison: October 29, 2020

CLINICAL DATA: Nephrolithiasis

EXAM:
ABDOMEN - 1 VIEW

[Series 1: dg abd 1 view · 0.14mm/px · 2 of 2 slices shown]
[im 1/2]
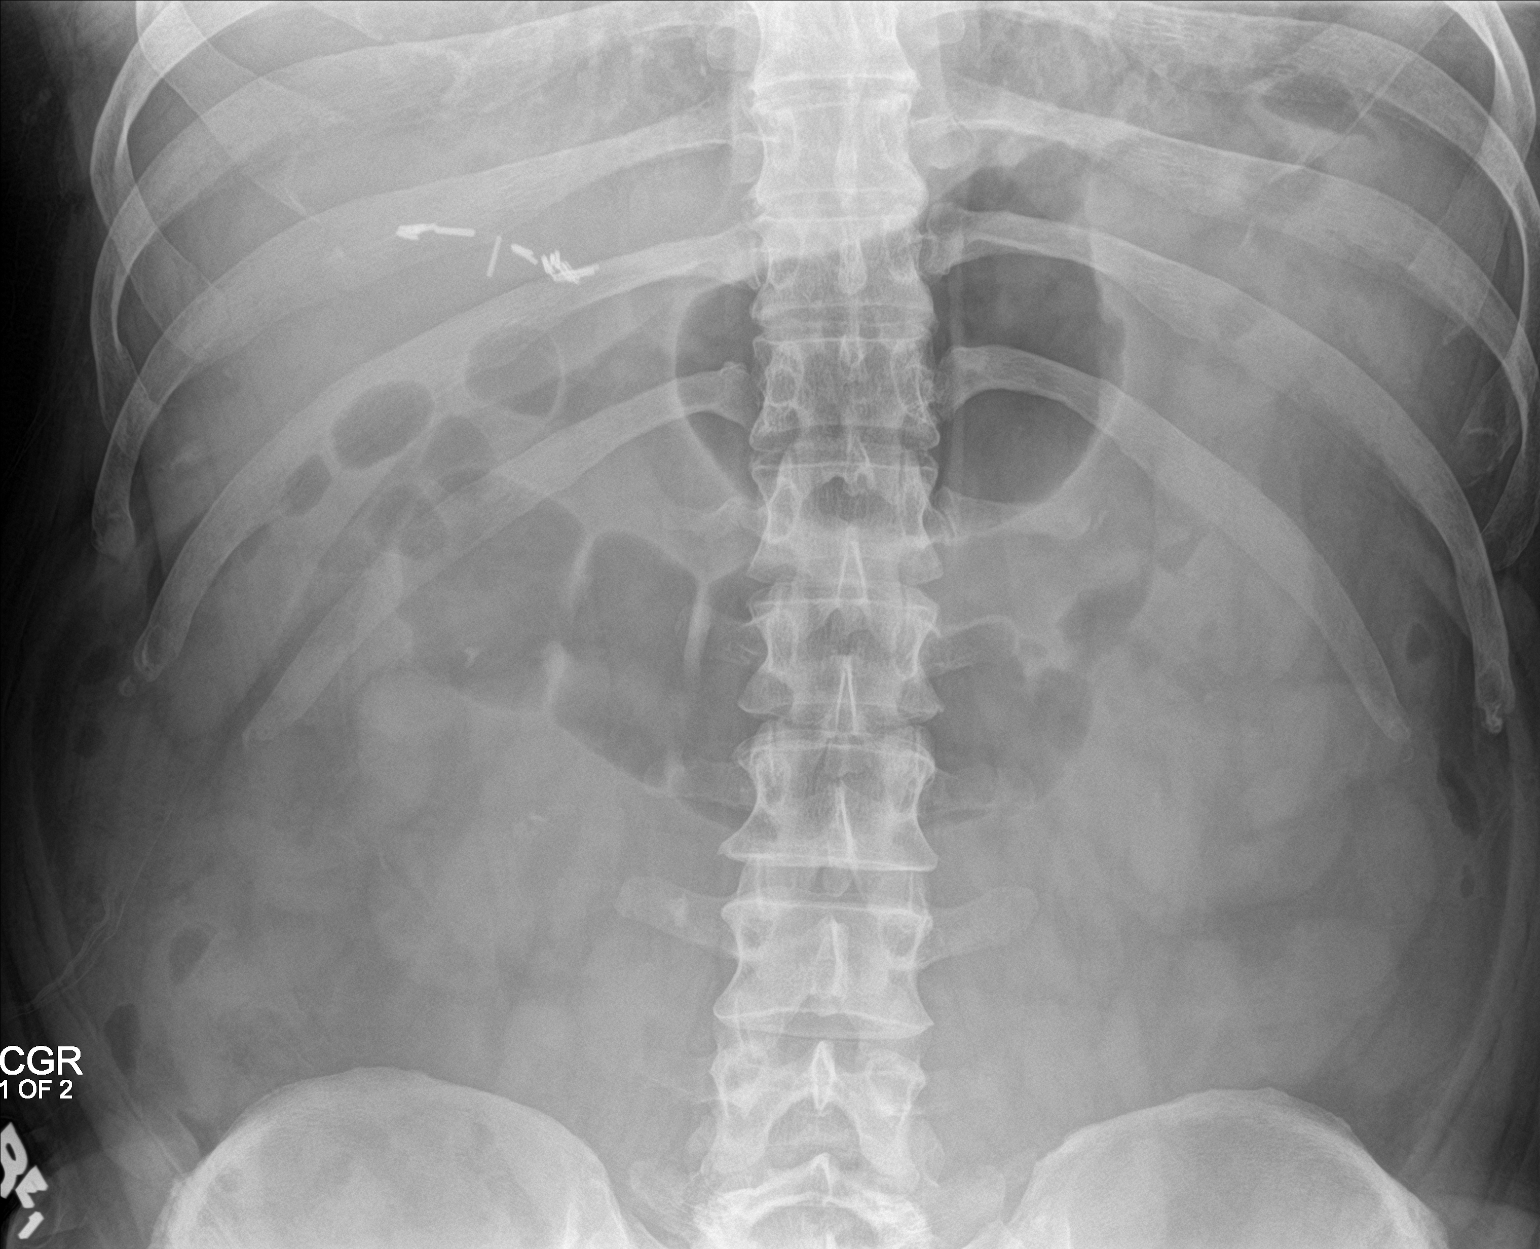
[im 2/2]
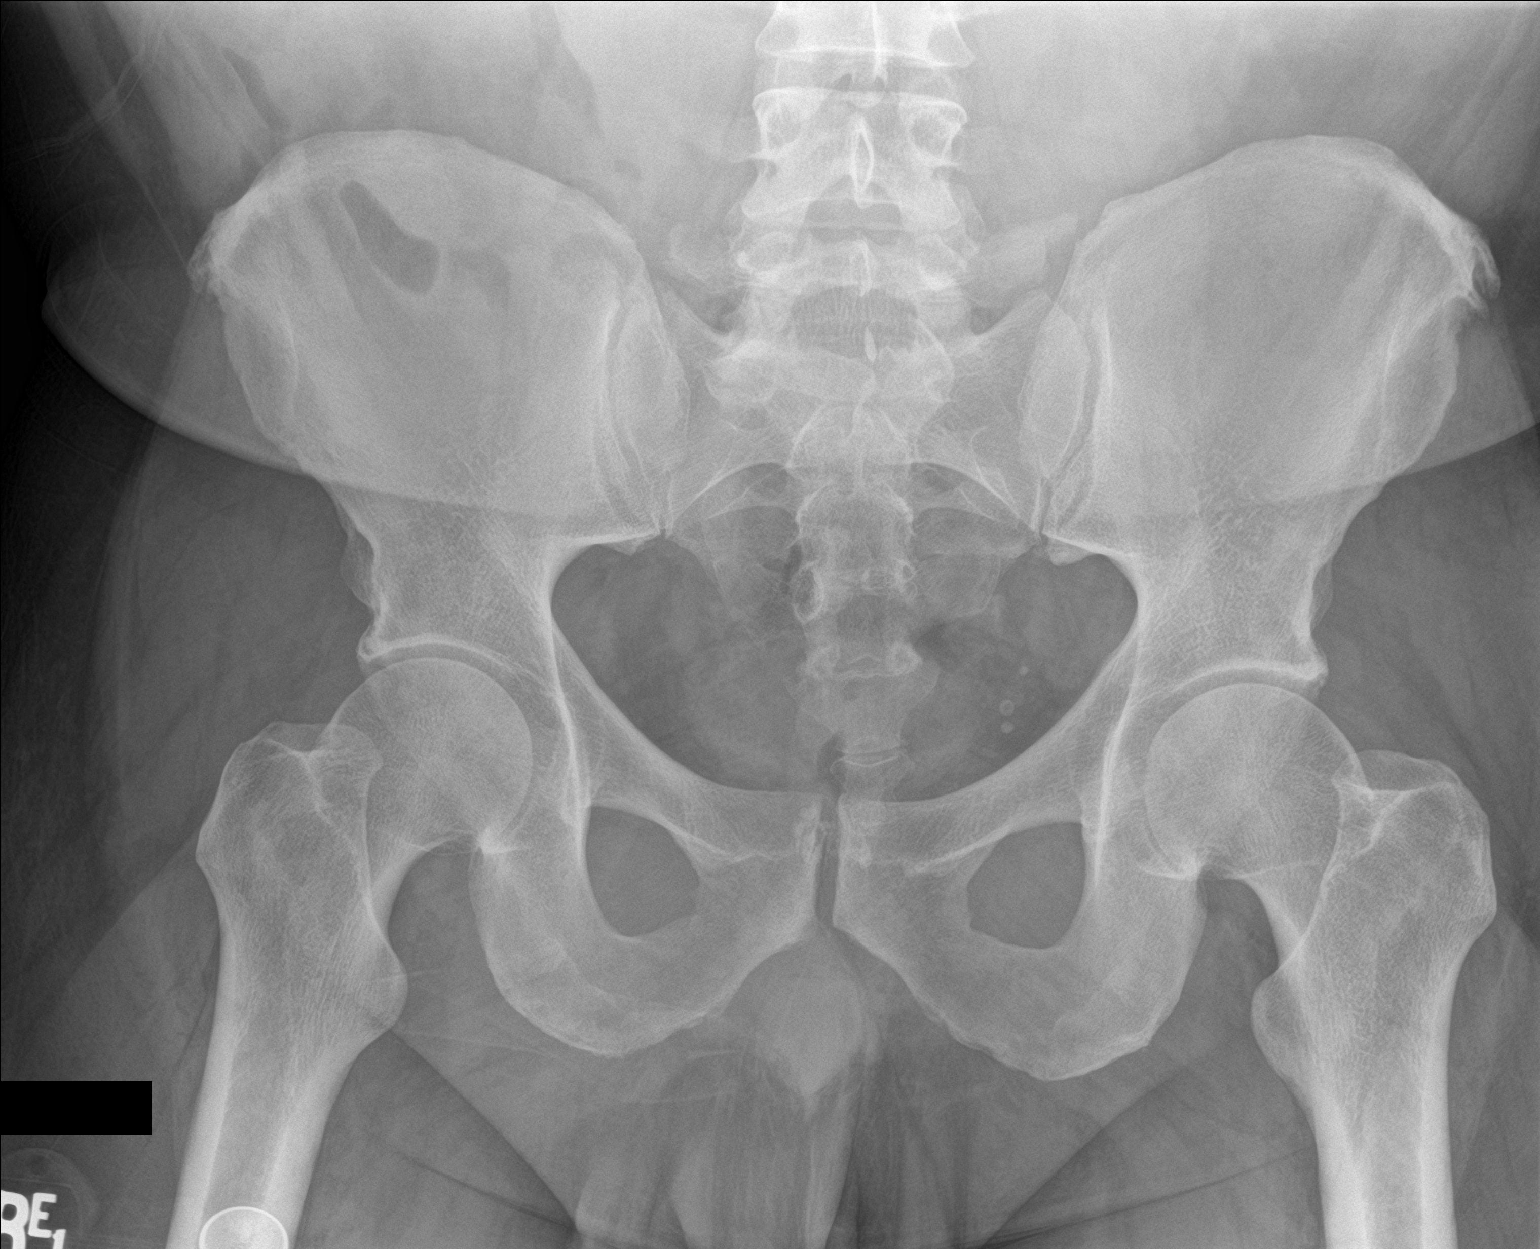

[2 of 2 positions shown; findings below may reference images not displayed]

FINDINGS: There is a stable 4 x 3 mm calculus in the mid right kidney. In the
lower pole of the right kidney, there is a 3 x 2 mm calculus with a
vague 4 x 4 mm calculus. On the left, there is a 3 x 2 mm calculus
in the mid kidney with a subtle 3 x 2 mm calculus more inferiorly.
Questionable 2 mm calculus more peripherally in the lower pole left
kidney. There are apparent phleboliths in the left pelvis, stable.
No new apparent renal calculi.

There is moderate stool in the colon. There is no bowel dilatation
or air-fluid level to suggest bowel obstruction. No free air.
Surgical clips in right upper quadrant.
IMPRESSION: 2-4 mm calculi in each kidney. No new calcifications evident.
Probable phleboliths left pelvis.

No bowel obstruction or free air evident.

## 2021-05-26 ENCOUNTER — Other Ambulatory Visit (HOSPITAL_COMMUNITY): Payer: Self-pay

## 2021-05-30 ENCOUNTER — Other Ambulatory Visit (HOSPITAL_COMMUNITY): Payer: Self-pay

## 2021-05-30 ENCOUNTER — Other Ambulatory Visit: Payer: Self-pay

## 2021-05-30 MED ORDER — LOTEMAX SM 0.38 % OP GEL
OPHTHALMIC | 0 refills | Status: DC
  Filled 2021-05-30: qty 5, 25d supply, fill #0

## 2021-06-02 ENCOUNTER — Other Ambulatory Visit: Payer: Self-pay

## 2021-06-06 ENCOUNTER — Other Ambulatory Visit: Payer: Self-pay

## 2021-06-06 ENCOUNTER — Other Ambulatory Visit: Payer: 59

## 2021-06-06 DIAGNOSIS — Z9852 Vasectomy status: Secondary | ICD-10-CM

## 2021-06-07 LAB — POST-VAS SPERM EVALUATION,QUAL: Volume: 4.6 mL

## 2021-06-08 IMAGING — CR DG ABDOMEN 1V
1 series · 2 of 2 positions shown · non-contrast
Comparison: October 31, 2020

CLINICAL DATA: Kidney stone follow-up

EXAM:
ABDOMEN - 1 VIEW

[Series 1: dg abd 1 view · 0.14mm/px · 2 of 2 slices shown]
[im 1/2]
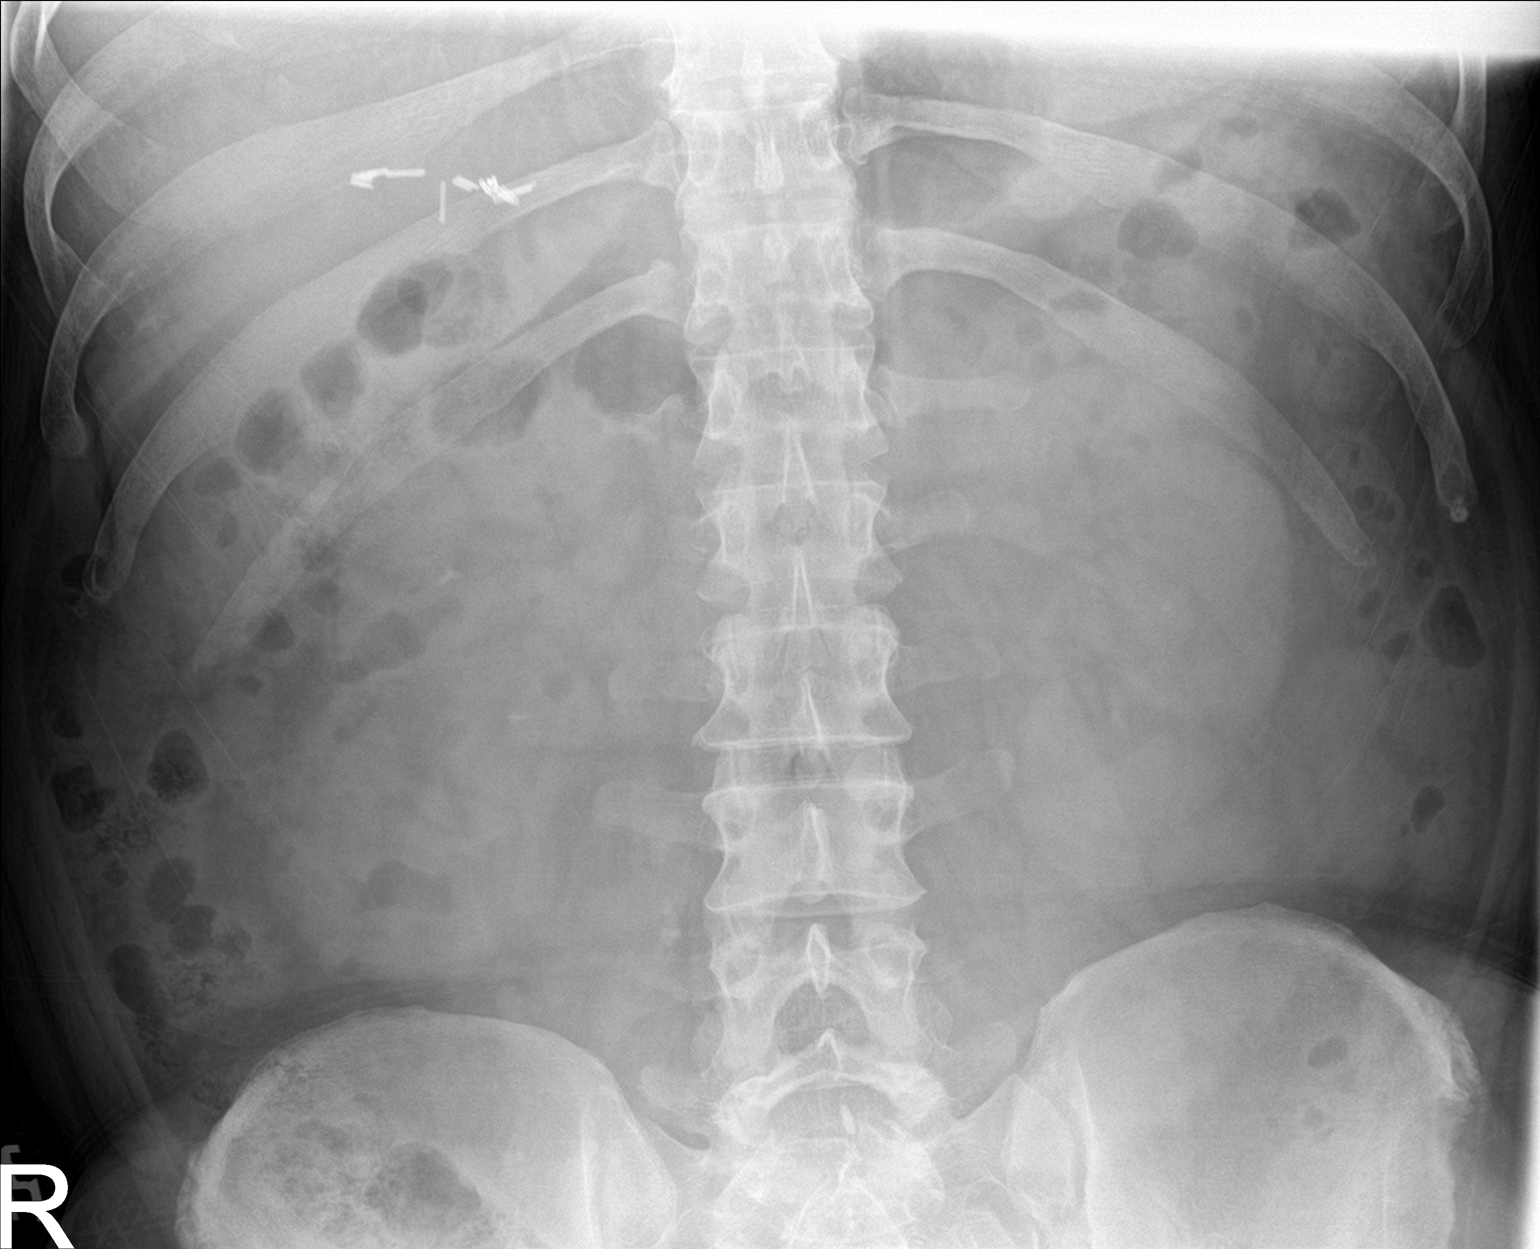
[im 2/2]
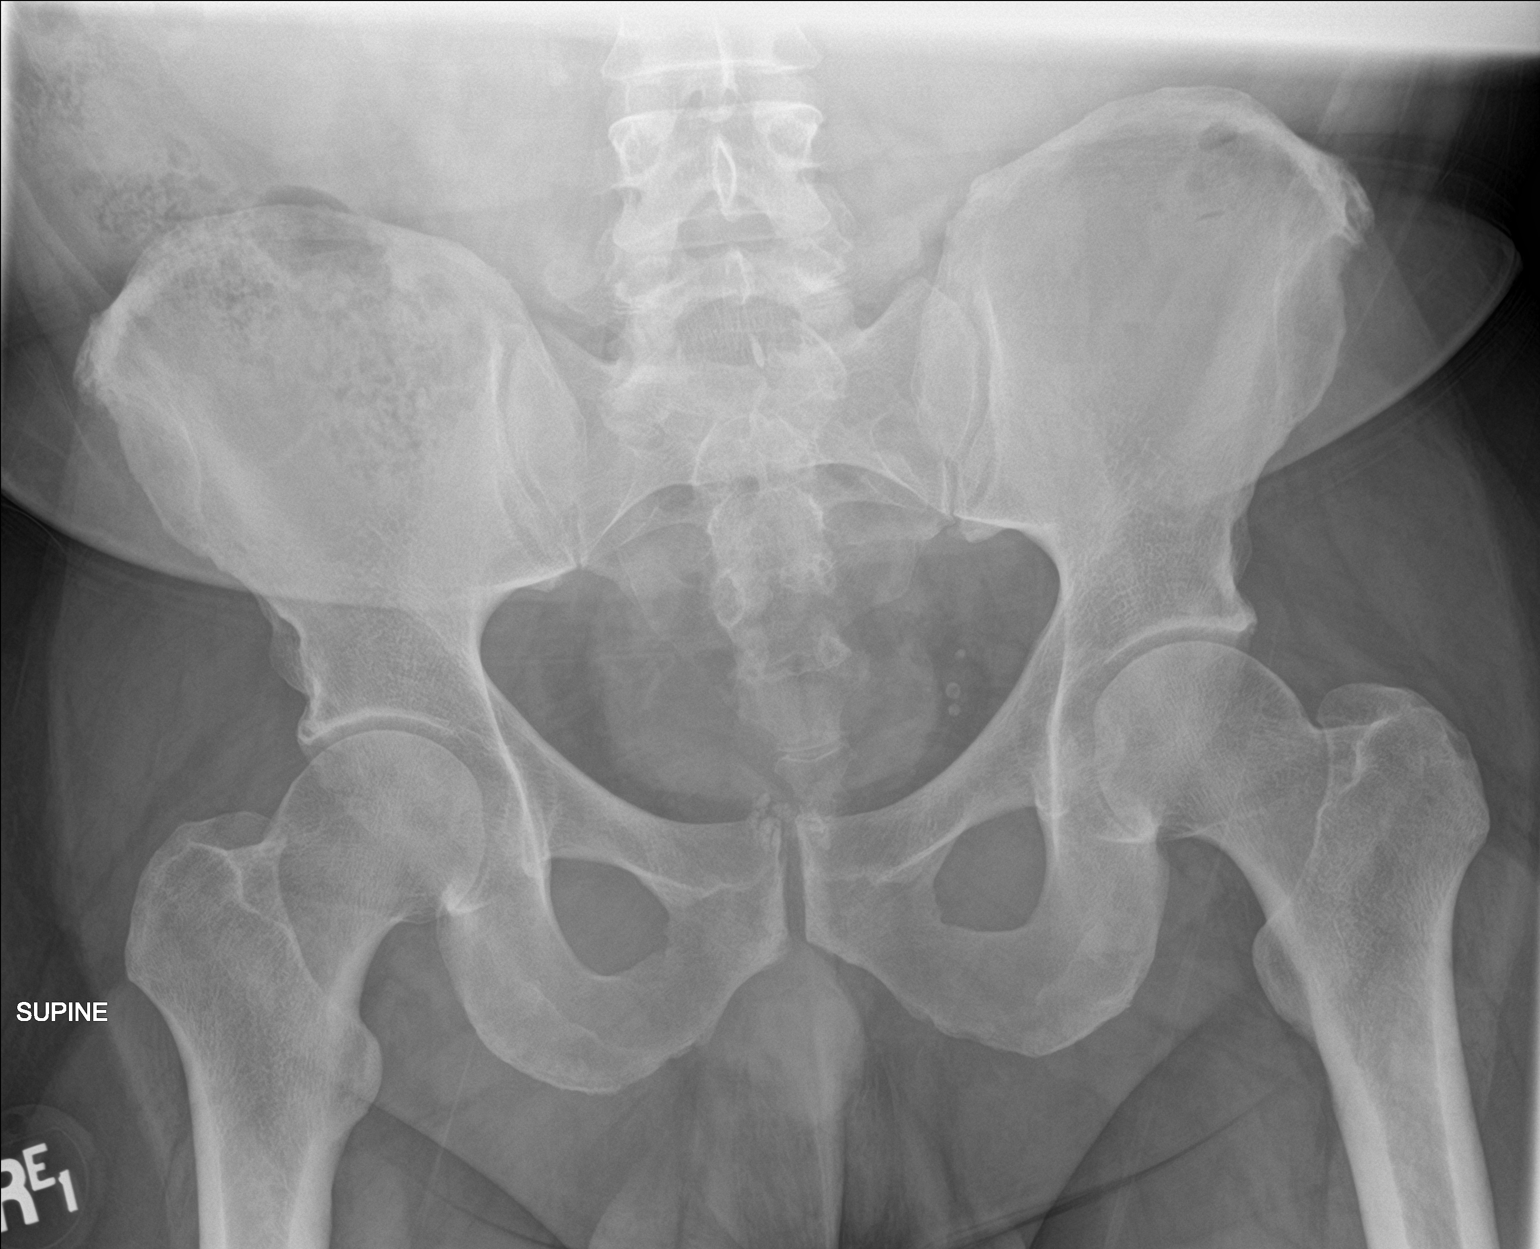

[2 of 2 positions shown; findings below may reference images not displayed]

FINDINGS: Bilateral renal nephroliths are again identified. There are several
calcifications along the expected course of the right ureter which
raise suspicion for right-sided ureteral stones
IMPRESSION: 1. Bilateral nephrolithiasis.
2. Multiple calcifications project along the expected course of the
right ureter raising suspicion for right-sided ureteral stones. This
can be further evaluated with cross-sectional imaging as clinically
indicated.

## 2021-06-28 IMAGING — CR DG ABDOMEN 1V
1 series · 2 of 2 positions shown · non-contrast
Comparison: 11/14/2020

CLINICAL DATA: Right ureteral stone post ESWL.  Nephrolithiasis

EXAM:
ABDOMEN - 1 VIEW

[Series 1: dg abd 1 view · 0.14mm/px · 2 of 2 slices shown]
[im 1/2]
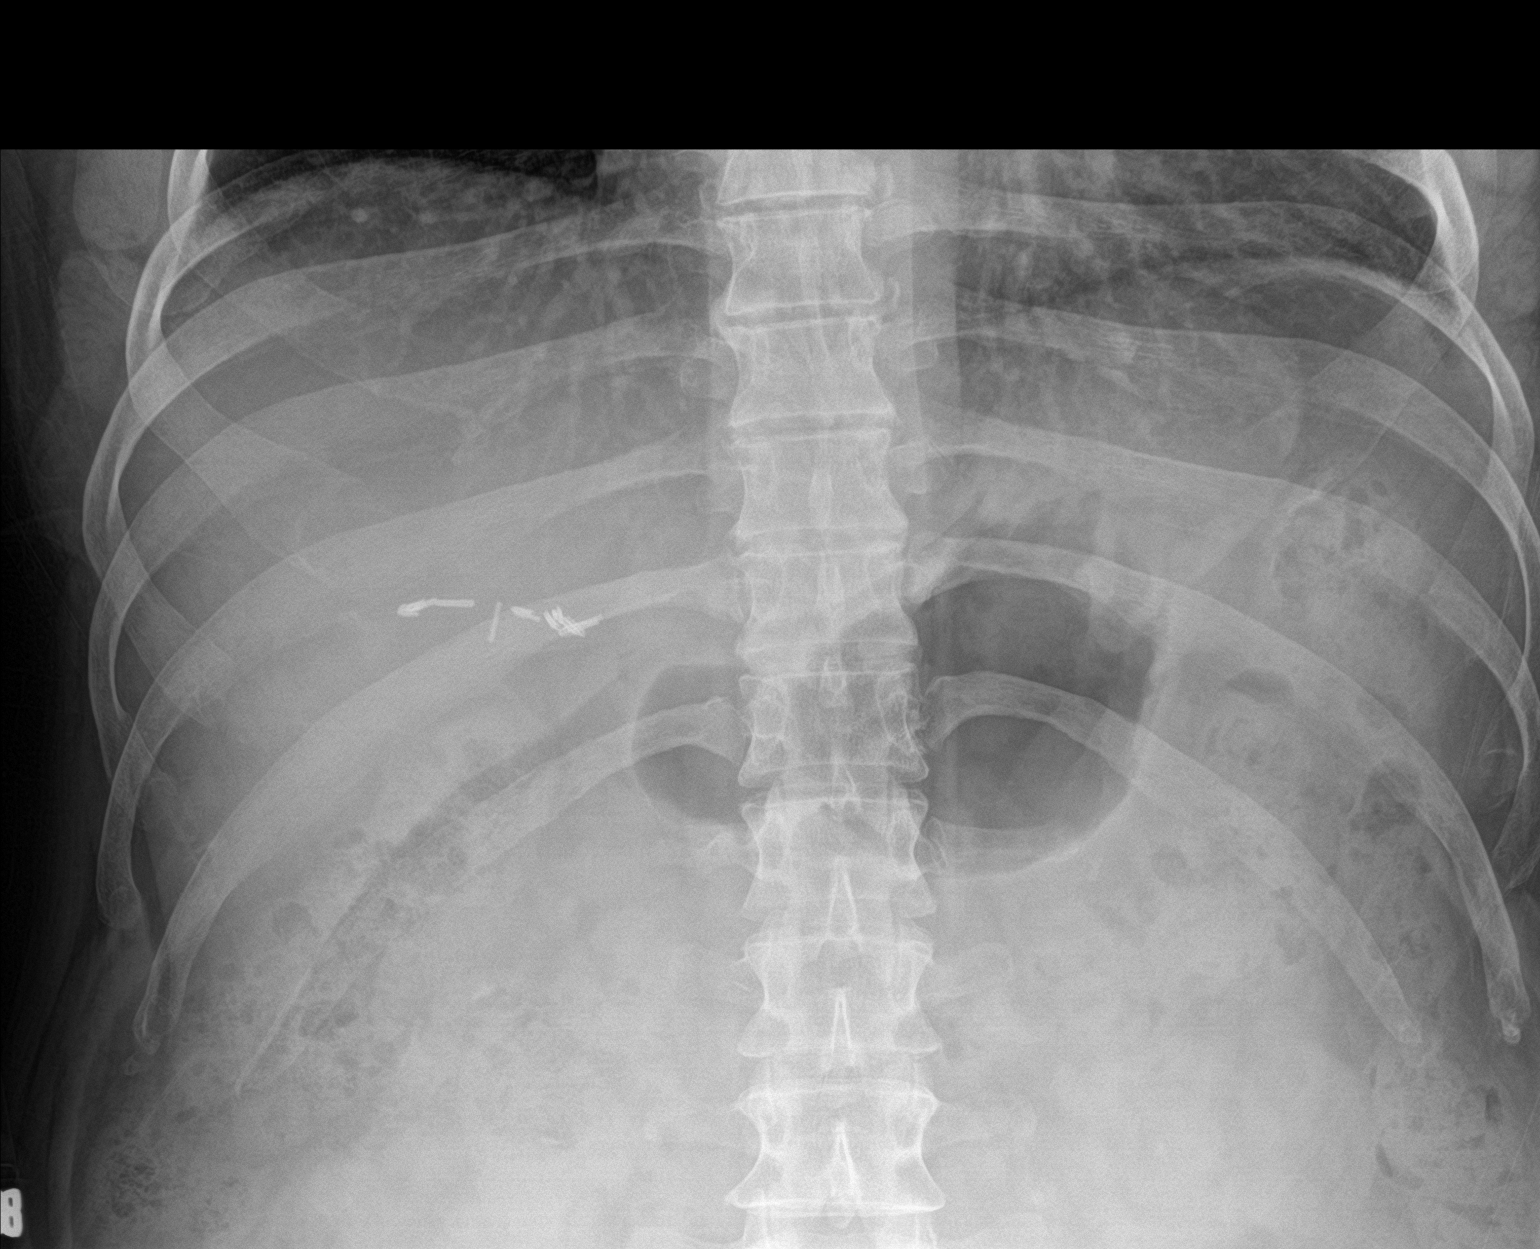
[im 2/2]
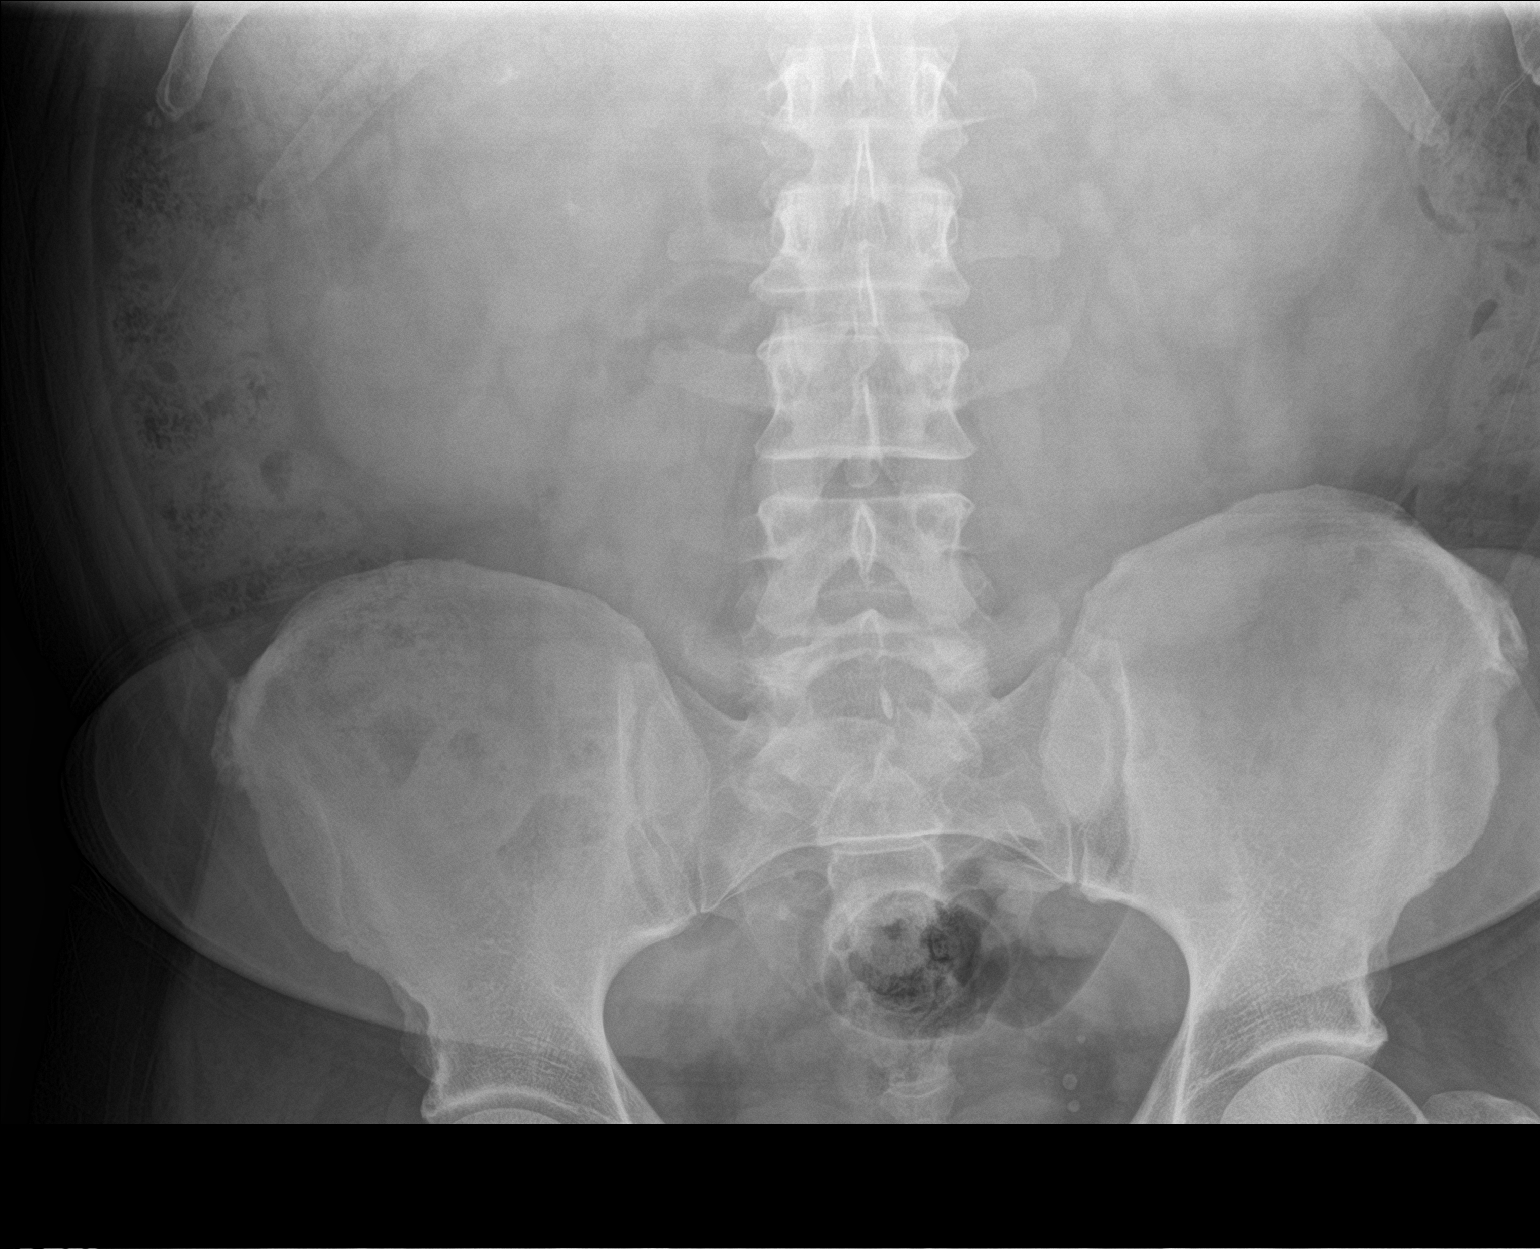

[2 of 2 positions shown; findings below may reference images not displayed]

FINDINGS: Small bilateral renal calculi. Renal calculi difficult to see due to
overlying bowel gas and stool.

Previously noted calculi in the mid right ureter no longer
visualized. No bladder calculi.

Normal bowel gas pattern. Mild-to-moderate stool in the colon.
Prostate calcifications. No acute skeletal abnormality.
IMPRESSION: Bilateral renal calculi. Right ureteral calculi no longer visualized
post ESWL.

## 2021-07-01 ENCOUNTER — Other Ambulatory Visit (HOSPITAL_COMMUNITY): Payer: Self-pay

## 2021-07-01 MED ORDER — CARESTART COVID-19 HOME TEST VI KIT
PACK | 0 refills | Status: DC
  Filled 2021-07-01: qty 4, 4d supply, fill #0

## 2021-07-07 ENCOUNTER — Other Ambulatory Visit (HOSPITAL_COMMUNITY): Payer: Self-pay

## 2021-07-07 ENCOUNTER — Other Ambulatory Visit: Payer: Self-pay | Admitting: Family Medicine

## 2021-07-07 MED ORDER — FREESTYLE LITE TEST VI STRP
ORAL_STRIP | 3 refills | Status: DC
  Filled 2021-07-07: qty 100, 90d supply, fill #0

## 2021-07-14 ENCOUNTER — Encounter: Payer: Self-pay | Admitting: Pharmacist

## 2021-07-14 ENCOUNTER — Other Ambulatory Visit: Payer: Self-pay

## 2021-07-16 ENCOUNTER — Other Ambulatory Visit: Payer: Self-pay

## 2021-07-23 ENCOUNTER — Other Ambulatory Visit (HOSPITAL_COMMUNITY): Payer: Self-pay

## 2021-07-28 ENCOUNTER — Other Ambulatory Visit: Payer: 59

## 2021-07-29 ENCOUNTER — Other Ambulatory Visit: Payer: 59

## 2021-07-29 ENCOUNTER — Other Ambulatory Visit: Payer: Self-pay

## 2021-07-29 DIAGNOSIS — N200 Calculus of kidney: Secondary | ICD-10-CM | POA: Diagnosis not present

## 2021-08-01 ENCOUNTER — Other Ambulatory Visit: Payer: Self-pay

## 2021-08-04 ENCOUNTER — Other Ambulatory Visit: Payer: Self-pay | Admitting: Physician Assistant

## 2021-08-04 DIAGNOSIS — G4733 Obstructive sleep apnea (adult) (pediatric): Secondary | ICD-10-CM | POA: Diagnosis not present

## 2021-08-07 ENCOUNTER — Other Ambulatory Visit: Payer: Self-pay

## 2021-08-12 ENCOUNTER — Other Ambulatory Visit: Payer: Self-pay

## 2021-08-12 ENCOUNTER — Other Ambulatory Visit: Payer: Self-pay | Admitting: Physician Assistant

## 2021-08-12 DIAGNOSIS — N200 Calculus of kidney: Secondary | ICD-10-CM

## 2021-08-13 ENCOUNTER — Other Ambulatory Visit: Payer: Self-pay

## 2021-08-13 ENCOUNTER — Ambulatory Visit
Admission: RE | Admit: 2021-08-13 | Discharge: 2021-08-13 | Disposition: A | Discharge status: Home / Self Care | Payer: 59 | Source: Ambulatory Visit | Attending: Physician Assistant | Admitting: Physician Assistant

## 2021-08-13 ENCOUNTER — Ambulatory Visit (INDEPENDENT_AMBULATORY_CARE_PROVIDER_SITE_OTHER): Payer: 59 | Admitting: Physician Assistant

## 2021-08-13 ENCOUNTER — Encounter: Payer: Self-pay | Admitting: Physician Assistant

## 2021-08-13 VITALS — BP 129/89 | HR 89 | Ht 67.0 in | Wt 222.0 lb

## 2021-08-13 DIAGNOSIS — N200 Calculus of kidney: Secondary | ICD-10-CM | POA: Insufficient documentation

## 2021-08-13 DIAGNOSIS — Z9852 Vasectomy status: Secondary | ICD-10-CM

## 2021-08-13 DIAGNOSIS — Z3009 Encounter for other general counseling and advice on contraception: Secondary | ICD-10-CM

## 2021-08-13 DIAGNOSIS — R82994 Hypercalciuria: Secondary | ICD-10-CM

## 2021-08-13 DIAGNOSIS — I878 Other specified disorders of veins: Secondary | ICD-10-CM | POA: Diagnosis not present

## 2021-08-13 LAB — URINALYSIS, COMPLETE
Bilirubin, UA: NEGATIVE
Glucose, UA: NEGATIVE
Ketones, UA: NEGATIVE
Nitrite, UA: NEGATIVE
Protein,UA: NEGATIVE
Specific Gravity, UA: 1.025 (ref 1.005–1.030)
Urobilinogen, Ur: 0.2 mg/dL (ref 0.2–1.0)
pH, UA: 6.5 (ref 5.0–7.5)

## 2021-08-13 LAB — MICROSCOPIC EXAMINATION: Bacteria, UA: NONE SEEN

## 2021-08-14 ENCOUNTER — Other Ambulatory Visit (HOSPITAL_COMMUNITY): Payer: Self-pay

## 2021-08-14 LAB — SODIUM, URINE, RANDOM: Sodium, Ur: 194 mmol/L

## 2021-08-14 MED ORDER — CARESTART COVID-19 HOME TEST VI KIT
PACK | 0 refills | Status: DC
  Filled 2021-08-14: qty 4, 4d supply, fill #0

## 2021-08-15 NOTE — Progress Notes (Signed)
08/13/2021 4:33 PM   Aaron Malone 04/10/1976 [id number]  CC: Chief Complaint  Patient presents with   Nephrolithiasis   HPI: Aaron Malone is a 45 y.o. male with PMH recurrent stone disease with hypercalciuria on 2.5 mg indapamide daily who presents today for follow-up of an acute stone episode and to discuss Litholink results.   Today he reports having spontaneously passed a stone 2 days ago at home.  Associated symptoms included right flank pain, urgency, frequency, and gross hematuria.  He brings the stone with him to clinic today, which measures approximately 5 mm.  His symptoms have since resolved.  Additionally, he recently repeated a Litholink in the setting of having increased his indapamide from 1.25 mg to 2.5 mg daily.  Together we reviewed the results of this most recent report, which revealed slightly decreased urine volume at 1.95 L/day, increased supersaturation of calcium oxalate, persistent hypercalciuria, now 436 mg/day but previously 510 mg/day and elevated urine sodium 256 mmol/day.  He confirms that he is taking the increased dose of indapamide 2.5 mg daily, but he thinks he was not particularly well-hydrated on the day that he performed his Litholink.  He notes that he does drink several liquid IV hydration packets weekly and wonders if this could be contributing to his urine sodium level.  He is also pursuing a low-carb and high-protein diet for weight loss.  He is not taking any calcium supplements.  He is avoiding high oxalate foods.  Additionally, he is s/p vasectomy and his post vas semen analysis had azoospermia.  He states his wife is requesting a repeat test for confirmation  In-office UA today positive for 2+ blood and trace leukocyte esterase; urine microscopy with 6-10 WBCs/HPF and 3-10 RBCs/HPF.  PMH: Past Medical History:  Diagnosis Date   Carpal tunnel syndrome    bilateral   COVID-19 08/2020   Gallstones 2012   History of kidney stones     Hyperlipidemia    Major depressive disorder, single episode, moderate (HCC)    Obesity    Sleep apnea    uses a CPAP    Surgical History: Past Surgical History:  Procedure Laterality Date   CARPAL TUNNEL RELEASE Bilateral 08/24/2014   Procedure: RIGHT LIMITED OPEN CARPAL TUNNEL RELEASE, LEFT CARPAL  TUNNEL INJECTION;  Surgeon: Roseanne Kaufman, MD;  Location: [facility];  Service: Orthopedics;  Laterality: Bilateral;   CARPAL TUNNEL RELEASE Left 06/20/2015   Procedure: LEFT CARPAL TUNNEL RELEASE;  Surgeon: Roseanne Kaufman, MD;  Location: [facility];  Service: Orthopedics;  Laterality: Left;   CHOLECYSTECTOMY  2012   COLONOSCOPY     CYSTOSCOPY W/ RETROGRADES Bilateral 12/16/2020   Procedure: CYSTOSCOPY WITH RETROGRADE PYELOGRAM;  Surgeon: Hollice Espy, MD;  Location: [facility];  Service: Urology;  Laterality: Bilateral;   CYSTOSCOPY/URETEROSCOPY/HOLMIUM LASER/STENT PLACEMENT Right 12/16/2020   Procedure: CYSTOSCOPY/URETEROSCOPY/HOLMIUM LASER/STENT PLACEMENT;  Surgeon: Hollice Espy, MD;  Location: [facility];  Service: Urology;  Laterality: Right;   EXTRACORPOREAL SHOCK WAVE LITHOTRIPSY Right 10/31/2020   Procedure: EXTRACORPOREAL SHOCK WAVE LITHOTRIPSY (ESWL);  Surgeon: Hollice Espy, MD;  Location: [facility];  Service: Urology;  Laterality: Right;   INGUINAL HERNIA REPAIR Bilateral 1983   LITHOTRIPSY  12/2003, 09/2006   TRIGGER FINGER RELEASE     thumb    Home Medications:  Allergies as of 08/13/2021   No Known Allergies      Medication List        Accurate as of August 13, 2021 11:59 PM.  If you have any questions, ask your nurse or doctor.          acetaminophen 500 MG tablet Commonly known as: TYLENOL Take 500 mg by mouth every 6 (six) hours as needed.   Carestart COVID-19 Home Test Kit Generic drug: COVID-19 At Home Antigen Test Use as directed within package instructions.   cholecalciferol 25 MCG (1000 UNIT) tablet Commonly known as:  VITAMIN D Take 1,000 Units by mouth daily.   desvenlafaxine 100 MG 24 hr tablet Commonly known as: PRISTIQ Take 1 tablet (100 mg total) by mouth daily.   Dexcom G6 Transmitter Misc Check blood sugar as recommended.   fluticasone 50 MCG/ACT nasal spray Commonly known as: FLONASE USE 2 SPRAYS IN EACH NOSTIL ONCE A DAY   FreeStyle Libre 2 Reader Devi Use to check blood sugar continuous as instructed.   FreeStyle Libre 2 Sensor Misc USE TO CHECK BLOOD SUGAR CONTINUOUS AS DIRECTED.   FREESTYLE LITE test strip Generic drug: glucose blood Use as directed to check blood glucose.   glucose monitoring kit monitoring kit 1 each by Does not apply route as needed for other.   ibuprofen 600 MG tablet Commonly known as: ADVIL Take 400 mg by mouth 2 (two) times daily.   indapamide 2.5 MG tablet Commonly known as: LOZOL Take 1 tablet (2.5 mg total) by mouth daily.   ipratropium 0.03 % nasal spray Commonly known as: ATROVENT PLACE 2 SPRAYS INTO BOTH NOSTRILS EVERY 12 HOURS. What changed: See the new instructions.   Lotemax SM 0.38 % Gel Generic drug: Loteprednol Etabonate Place 1 drop into affected eye 3 times a day   Moderna COVID-19 Vaccine 100 MCG/0.5ML injection Generic drug: COVID-19 mRNA vaccine (Moderna) USE AS DIRECTED   Ozempic (0.25 or 0.5 MG/DOSE) 2 MG/1.5ML Sopn Generic drug: Semaglutide(0.25 or 0.5MG/DOS) Inject 0.25 mg into the skin once a week.   propranolol 10 MG tablet Commonly known as: INDERAL TAKE 1 TABLET BY MOUTH ONCE DAILY AS NEEDED   pseudoephedrine 60 MG tablet Commonly known as: SUDAFED Take 60 mg by mouth every 4 (four) hours as needed for congestion.   Turmeric 450 MG Caps Take 450 mg by mouth daily.        Allergies:  No Known Allergies  Family History: Family History  Problem Relation Age of Onset   Hyperlipidemia Mother    Hypertension Mother    Irritable bowel syndrome Mother    Colon polyps Mother        x lots   Polycystic  ovary syndrome Sister    Colon cancer Maternal Grandmother        dx in her 27's or 40's   Lung cancer Maternal Grandmother    Heart failure Maternal Grandfather    Heart disease Maternal Grandfather    Prostate cancer Maternal Grandfather    Multiple myeloma Paternal Grandfather    Diabetes Paternal Grandmother    Colon polyps Maternal Uncle     Social History:   reports that he has never smoked. He has never used smokeless tobacco. He reports current alcohol use. He reports that he does not use drugs.  Physical Exam: BP 129/89 (BP Location: Left Arm, Patient Position: Sitting, Cuff Size: Large)    Pulse 89    Ht 5' 7"  (1.702 m)    Wt 222 lb (100.7 kg)    BMI 34.77 kg/m   Constitutional:  Alert and oriented, no acute distress, nontoxic appearing HEENT: [place], AT Cardiovascular: No clubbing, cyanosis, or edema Respiratory:  Normal respiratory effort, no increased work of breathing Skin: No rashes, bruises or suspicious lesions Neurologic: Grossly intact, no focal deficits, moving all 4 extremities Psychiatric: Normal mood and affect  Laboratory Data: Results for orders placed or performed in visit on 08/13/21  Microscopic Examination   Urine  Result Value Ref Range   WBC, UA 6-10 (A) 0 - 5 /hpf   RBC 3-10 (A) 0 - 2 /hpf   Epithelial Cells (non renal) 0-10 0 - 10 /hpf   Bacteria, UA None seen None seen/Few  Urinalysis, Complete  Result Value Ref Range   Specific Gravity, UA 1.025 1.005 - 1.030   pH, UA 6.5 5.0 - 7.5   Color, UA Yellow Yellow   Appearance Ur Clear Clear   Leukocytes,UA Trace (A) Negative   Protein,UA Negative Negative/Trace   Glucose, UA Negative Negative   Ketones, UA Negative Negative   RBC, UA 2+ (A) Negative   Bilirubin, UA Negative Negative   Urobilinogen, Ur 0.2 0.2 - 1.0 mg/dL   Nitrite, UA Negative Negative   Microscopic Examination See below:   Sodium, urine, random  Result Value Ref Range   Sodium, Ur 194 Not Estab. mmol/L   Pertinent  Imaging: Results for orders placed during the hospital encounter of 08/13/21  DG Abd 1 View  Narrative CLINICAL DATA:  Renal stones  EXAM: ABDOMEN - 1 VIEW  COMPARISON:  None.  FINDINGS: Bowel gas pattern is nonspecific. There are few small bilateral renal stones each measuring less than 5 mm. Kidneys are partly obscured by bowel contents. Phleboliths are seen in the left side of pelvis. Coarse calcifications are seen in the prostate. Surgical clips are seen in the right upper quadrant.  IMPRESSION: Small bilateral renal stones.  Nonspecific bowel gas pattern.   Electronically Signed By: Elmer Picker M.D. On: 08/14/2021 14:59  I personally reviewed the images referenced above and note no evident ureteral stones.  Assessment & Plan:   1. Nephrolithiasis Will send spontaneously passed stone for analysis and contact patient with results.  He does have some mild pyuria and microscopic hematuria today, however given recent stone passage and resolution of his symptoms, I suspect this is residual and should clear on its own.  We will plan to repeat a UA in about 6 weeks as below. - Urinalysis, Complete - Microscopic Examination - Calculi, with Photograph (to Clinical Lab) - Urinalysis, Complete; Future  2. Hypercalciuria We will plan to continue indapamide 2.5 mg daily.  We discussed that managing this is good to be very challenging given his low-carb/high-protein diet.  Regardless, I encouraged him to keep a low oxalate diet, remain well-hydrated, and he is going to start limiting his liquid IV hydration packets.  I obtained a random urine sodium today for comparison purposes and we will repeat this in about 6 weeks based on the changes above.  Additionally offered him a referral to nutrition, but he declined this. - Sodium, urine, random - Sodium, urine, random; Future  3. Status post vasectomy Azoospermia on post vas semen analysis, okay to repeat per patient  request.  Return in about 6 weeks (around 09/24/2021) for Lab visit for repeat semenalysis, UA, urine sodium.  Debroah Loop, PA-C  [facility] [address], [place], [place] [address] [phone]

## 2021-08-20 LAB — CALCULI, WITH PHOTOGRAPH (CLINICAL LAB)
CaHPO4 (Brushite): 20 %
Calcium Oxalate Dihydrate: 10 %
Calcium Oxalate Monohydrate: 50 %
Hydroxyapatite: 20 %
Weight Calculi: 67 mg

## 2021-08-21 ENCOUNTER — Telehealth: Payer: Self-pay

## 2021-08-21 NOTE — Telephone Encounter (Signed)
Notified pt of stone analysis via Mychart, see separate encounter. Appt already made for repeat UA, urine sodium and semen check for 2/9.

## 2021-08-21 NOTE — Telephone Encounter (Signed)
-----   Message from Nori Riis, PA-C sent at 08/21/2021  8:10 AM EST ----- Please let Aaron Edelman know that his stone analysis noted that it was a calcium oxalate stone and to continue the indapamide.  Sam also wanted him to drop off another urine specimen to ensure the microscopic blood has cleared.  Please schedule that lab appointment for him.

## 2021-09-05 DIAGNOSIS — G4733 Obstructive sleep apnea (adult) (pediatric): Secondary | ICD-10-CM | POA: Diagnosis not present

## 2021-09-09 ENCOUNTER — Telehealth: Payer: Self-pay

## 2021-09-09 ENCOUNTER — Other Ambulatory Visit: Payer: Self-pay

## 2021-09-09 MED ORDER — PAXLOVID (300/100) 20 X 150 MG & 10 X 100MG PO TBPK
3.0000 | ORAL_TABLET | Freq: Two times a day (BID) | ORAL | 0 refills | Status: DC
  Filled 2021-09-09: qty 30, 5d supply, fill #0

## 2021-09-09 NOTE — Telephone Encounter (Signed)
Outpatient Pharmacy Oral COVID Treatment Note  I connected with Aaron Malone on 09/09/2021/11:38 AM by telephone and verified that I am speaking with the correct person using two identifiers.  I discussed the limitations, risks, security, and privacy concerns of performing an evaluation and management service by telephone and the availability of in person appointments via referral to a physician. The patient expressed understanding and agreed to proceed.  Pharmacy location: [place]  Diagnosis: COVID-19 infection  Purpose of visit: Discussion of potential use of Paxlovid, a new treatment for mild to moderate COVID-19 viral infection in non-hospitalized patients.  Subjective/Objective: Patient is a 46 y.o. male who is presenting with COVID 19 viral infection.  COVID 19 viral infection. Their symptoms began on 09/09/21 with sore throat and congestion.  The patient has confirmed COVID-19 via a home test on 09/09/21.   Past Medical History:  Diagnosis Date   Carpal tunnel syndrome    bilateral   COVID-19 08/2020   Gallstones 2012   History of kidney stones    Hyperlipidemia    Major depressive disorder, single episode, moderate (HCC)    Obesity    Sleep apnea    uses a CPAP    No Known Allergies   Current Outpatient Medications:    acetaminophen (TYLENOL) 500 MG tablet, Take 500 mg by mouth every 6 (six) hours as needed., Disp: , Rfl:    cholecalciferol (VITAMIN D) 25 MCG (1000 UNIT) tablet, Take 1,000 Units by mouth daily., Disp: , Rfl:    Continuous Blood Gluc Receiver (FREESTYLE LIBRE 2 READER) DEVI, Use to check blood sugar continuous as instructed., Disp: 1 each, Rfl: 0   Continuous Blood Gluc Sensor (FREESTYLE LIBRE 2 SENSOR) MISC, USE TO CHECK BLOOD SUGAR CONTINUOUS AS DIRECTED., Disp: 1 each, Rfl: 5   Continuous Blood Gluc Transmit (DEXCOM G6 TRANSMITTER) MISC, Check blood sugar as recommended., Disp: 1 each, Rfl: 0   COVID-19 At Home Antigen Test  (CARESTART COVID-19 HOME TEST) KIT, Use as directed, Disp: 4 each, Rfl: 0   desvenlafaxine (PRISTIQ) 100 MG 24 hr tablet, Take 1 tablet (100 mg total) by mouth daily., Disp: 90 tablet, Rfl: 2   fluticasone (FLONASE) 50 MCG/ACT nasal spray, USE 2 SPRAYS IN EACH NOSTIL ONCE A DAY, Disp: 48 g, Rfl: 3   glucose blood (FREESTYLE LITE) test strip, Use as directed to check blood glucose., Disp: 100 strip, Rfl: 3   glucose monitoring kit (FREESTYLE) monitoring kit, 1 each by Does not apply route as needed for other., Disp: 1 each, Rfl: 0   ibuprofen (ADVIL,MOTRIN) 600 MG tablet, Take 400 mg by mouth 2 (two) times daily., Disp: , Rfl:    indapamide (LOZOL) 2.5 MG tablet, Take 1 tablet (2.5 mg total) by mouth daily., Disp: 90 tablet, Rfl: 3   ipratropium (ATROVENT) 0.03 % nasal spray, PLACE 2 SPRAYS INTO BOTH NOSTRILS EVERY 12 HOURS. (Patient taking differently: Place 2 sprays into both nostrils daily as needed for rhinitis.), Disp: 30 mL, Rfl: 11   Loteprednol Etabonate (LOTEMAX SM) 0.38 % GEL, Place 1 drop into affected eye 3 times a day, Disp: 5 g, Rfl: 0   nirmatrelvir & ritonavir (PAXLOVID, 300/100,) 20 x 150 MG & 10 x 100MG TBPK, Take 3 tablets by mouth 2 (two) times daily for 5 days., Disp: 30 tablet, Rfl: 0   propranolol (INDERAL) 10 MG tablet, TAKE 1 TABLET BY MOUTH ONCE DAILY AS NEEDED, Disp: 90 tablet, Rfl: 1   pseudoephedrine (SUDAFED) 60 MG  tablet, Take 60 mg by mouth every 4 (four) hours as needed for congestion., Disp: , Rfl:    Semaglutide,0.25 or 0.5MG/DOS, 2 MG/1.5ML SOPN, Inject 0.25 mg into the skin once a week., Disp: 6 mL, Rfl: 3   Turmeric 450 MG CAPS, Take 450 mg by mouth daily., Disp: , Rfl:   Lab Monitoring: eGFR 109  Drug Interactions Noted: none  Plan:  This patient is a 46 y.o. male that meets the criteria for Emergency Use Authorization of Paxlovid. After reviewing the emergency use authorization with the patient, the patient agrees to receive Paxlovid.  Through FDA  guidance and current [place] standing order Paxlovid will be prescribed to the patient.   Patient contacted for counseling on 09/09/21 and verbalized understanding.   Delivery or Pick-Up Date: 09/09/21  Follow up instructions:    Take prescription BID x 5 days as directed Counseling was provided by pharmacist. Reach out to pharmacist with follow up questions For concerns regarding further COVID symptoms please follow up with your PCP or urgent care For urgent or life-threatening issues, seek care at your local emergency department   [place] J 09/09/2021, 11:38 AM [facility] Pharmacist Phone# [phone]

## 2021-09-18 ENCOUNTER — Telehealth: Payer: Self-pay | Admitting: Family Medicine

## 2021-09-18 DIAGNOSIS — E7800 Pure hypercholesterolemia, unspecified: Secondary | ICD-10-CM

## 2021-09-18 DIAGNOSIS — Z125 Encounter for screening for malignant neoplasm of prostate: Secondary | ICD-10-CM

## 2021-09-18 DIAGNOSIS — E559 Vitamin D deficiency, unspecified: Secondary | ICD-10-CM

## 2021-09-18 NOTE — Telephone Encounter (Signed)
-----   Message from Ellamae Sia sent at 09/18/2021 11:51 AM EST ----- Regarding: Lab orders for Wednesday, 2.15.23 Patient is scheduled for CPX labs, please order future labs, Thanks , Karna Christmas

## 2021-09-25 ENCOUNTER — Other Ambulatory Visit: Payer: Self-pay

## 2021-09-25 ENCOUNTER — Other Ambulatory Visit: Payer: 59

## 2021-09-25 DIAGNOSIS — Z3009 Encounter for other general counseling and advice on contraception: Secondary | ICD-10-CM

## 2021-09-25 DIAGNOSIS — R82994 Hypercalciuria: Secondary | ICD-10-CM | POA: Diagnosis not present

## 2021-09-25 DIAGNOSIS — N200 Calculus of kidney: Secondary | ICD-10-CM | POA: Diagnosis not present

## 2021-09-25 LAB — URINALYSIS, COMPLETE
Bilirubin, UA: NEGATIVE
Glucose, UA: NEGATIVE
Ketones, UA: NEGATIVE
Leukocytes,UA: NEGATIVE
Nitrite, UA: NEGATIVE
Protein,UA: NEGATIVE
Specific Gravity, UA: >1.03 — ABNORMAL HIGH (ref 1.005–1.030)
Urobilinogen, Ur: 0.2 mg/dL (ref 0.2–1.0)
pH, UA: 5.5 (ref 5.0–7.5)

## 2021-09-25 LAB — MICROSCOPIC EXAMINATION: Bacteria, UA: NONE SEEN

## 2021-09-26 LAB — POST-VAS SPERM EVALUATION,QUAL: Volume: 4.4 mL

## 2021-09-26 LAB — SODIUM, URINE, RANDOM: Sodium, Ur: 130 mmol/L

## 2021-10-01 ENCOUNTER — Other Ambulatory Visit (INDEPENDENT_AMBULATORY_CARE_PROVIDER_SITE_OTHER): Payer: 59

## 2021-10-01 ENCOUNTER — Other Ambulatory Visit (HOSPITAL_COMMUNITY): Payer: Self-pay

## 2021-10-01 ENCOUNTER — Other Ambulatory Visit: Payer: Self-pay | Admitting: Family Medicine

## 2021-10-01 ENCOUNTER — Other Ambulatory Visit: Payer: Self-pay

## 2021-10-01 DIAGNOSIS — E7800 Pure hypercholesterolemia, unspecified: Secondary | ICD-10-CM | POA: Diagnosis not present

## 2021-10-01 DIAGNOSIS — Z125 Encounter for screening for malignant neoplasm of prostate: Secondary | ICD-10-CM | POA: Diagnosis not present

## 2021-10-01 DIAGNOSIS — E559 Vitamin D deficiency, unspecified: Secondary | ICD-10-CM | POA: Diagnosis not present

## 2021-10-01 LAB — COMPREHENSIVE METABOLIC PANEL
ALT: 27 U/L (ref 0–53)
AST: 19 U/L (ref 0–37)
Albumin: 4.6 g/dL (ref 3.5–5.2)
Alkaline Phosphatase: 54 U/L (ref 39–117)
BUN: 19 mg/dL (ref 6–23)
CO2: 33 mEq/L — ABNORMAL HIGH (ref 19–32)
Calcium: 9.8 mg/dL (ref 8.4–10.5)
Chloride: 99 mEq/L (ref 96–112)
Creatinine, Ser: 0.82 mg/dL (ref 0.40–1.50)
GFR: 107.7 mL/min (ref >60.00)
Glucose, Bld: 95 mg/dL (ref 70–99)
Potassium: 3.5 mEq/L (ref 3.5–5.1)
Sodium: 140 mEq/L (ref 135–145)
Total Bilirubin: 0.4 mg/dL (ref 0.2–1.2)
Total Protein: 7.7 g/dL (ref 6.0–8.3)

## 2021-10-01 LAB — PSA: PSA: 0.42 ng/mL (ref 0.10–4.00)

## 2021-10-01 LAB — LIPID PANEL
Cholesterol: 217 mg/dL — ABNORMAL HIGH (ref 0–200)
HDL: 49.7 mg/dL (ref >39.00)
LDL Cholesterol: 150 mg/dL — ABNORMAL HIGH (ref 0–99)
NonHDL: 167.47
Total CHOL/HDL Ratio: 4
Triglycerides: 87 mg/dL (ref 0.0–149.0)
VLDL: 17.4 mg/dL (ref 0.0–40.0)

## 2021-10-01 LAB — VITAMIN D 25 HYDROXY (VIT D DEFICIENCY, FRACTURES): VITD: 23.59 ng/mL — ABNORMAL LOW (ref 30.00–100.00)

## 2021-10-01 MED ORDER — DESVENLAFAXINE SUCCINATE ER 100 MG PO TB24
100.0000 mg | ORAL_TABLET | Freq: Every day | ORAL | 0 refills | Status: DC
  Filled 2021-10-01: qty 90, 90d supply, fill #0

## 2021-10-02 NOTE — Progress Notes (Signed)
No critical labs need to be addressed urgently. We will discuss labs in detail at upcoming office visit.   

## 2021-10-03 ENCOUNTER — Other Ambulatory Visit: Payer: 59

## 2021-10-06 DIAGNOSIS — G4733 Obstructive sleep apnea (adult) (pediatric): Secondary | ICD-10-CM | POA: Diagnosis not present

## 2021-10-10 ENCOUNTER — Encounter: Payer: Self-pay | Admitting: Family Medicine

## 2021-10-10 ENCOUNTER — Other Ambulatory Visit: Payer: Self-pay

## 2021-10-10 ENCOUNTER — Ambulatory Visit (INDEPENDENT_AMBULATORY_CARE_PROVIDER_SITE_OTHER): Payer: 59 | Admitting: Family Medicine

## 2021-10-10 VITALS — BP 100/66 | HR 103 | Temp 98.3°F | Ht 66.0 in | Wt 218.2 lb

## 2021-10-10 DIAGNOSIS — F331 Major depressive disorder, recurrent, moderate: Secondary | ICD-10-CM

## 2021-10-10 DIAGNOSIS — R7401 Elevation of levels of liver transaminase levels: Secondary | ICD-10-CM

## 2021-10-10 DIAGNOSIS — G4733 Obstructive sleep apnea (adult) (pediatric): Secondary | ICD-10-CM

## 2021-10-10 DIAGNOSIS — E7800 Pure hypercholesterolemia, unspecified: Secondary | ICD-10-CM | POA: Diagnosis not present

## 2021-10-10 DIAGNOSIS — Z0000 Encounter for general adult medical examination without abnormal findings: Secondary | ICD-10-CM

## 2021-10-10 DIAGNOSIS — E669 Obesity, unspecified: Secondary | ICD-10-CM

## 2021-10-10 DIAGNOSIS — E559 Vitamin D deficiency, unspecified: Secondary | ICD-10-CM | POA: Diagnosis not present

## 2021-10-10 MED ORDER — DESVENLAFAXINE SUCCINATE ER 50 MG PO TB24
100.0000 mg | ORAL_TABLET | Freq: Every day | ORAL | 1 refills | Status: DC
  Filled 2021-10-10: qty 90, 45d supply, fill #0
  Filled 2021-11-17 – 2021-12-01 (×4): qty 90, 45d supply, fill #1

## 2021-10-10 MED ORDER — SEMAGLUTIDE (1 MG/DOSE) 4 MG/3ML ~~LOC~~ SOPN
1.0000 mg | PEN_INJECTOR | SUBCUTANEOUS | 0 refills | Status: DC
  Filled 2021-10-10: qty 9, 84d supply, fill #0

## 2021-10-10 NOTE — Assessment & Plan Note (Signed)
Stable, chronic.  Continue current medication but start wean   Pristiq 50 mg XL daily.

## 2021-10-10 NOTE — Progress Notes (Signed)
Patient ID: Aaron Malone, male    DOB: [birth year], 47 y.o.   MRN: [id number]  This visit was conducted in person.  BP 100/66    Pulse (!) 103    Temp 98.3 F (36.8 C) (Temporal)    Ht _0  (1.676 m)    Wt 218 lb 3 oz (99 kg)    SpO2 97%    BMI 35.22 kg/m    CC:  Chief Complaint  Patient presents with   Annual Exam    Subjective:   HPI: Aaron Malone is a 46 y.o. male presenting on 10/10/2021 for Annual Exam  Elevated Cholesterol:  Lab Results  Component Value Date   CHOL 217 (H) 10/01/2021   HDL 49.70 10/01/2021   LDLCALC 150 (H) 10/01/2021   LDLDIRECT 147.8 05/03/2013   TRIG 87.0 10/01/2021   CHOLHDL 4 10/01/2021  Using medications without problems: Muscle aches:  Diet compliance: moderate Exercise: minimal Other complaints: The 10-year ASCVD risk score (Arnett DK, et al., 2019) is: 1.2%   Values used to calculate the score:     Age: 54 years     Sex: Male     Is Non-Hispanic African American: No     Diabetic: No     Tobacco smoker: No     Systolic Blood Pressure: 093 mmHg     Is BP treated: No     HDL Cholesterol: 49.7 mg/dL     Total Cholesterol: 217 mg/dL   Situation anxiety/MDD: well  controlled on Pristiq 100 mg daily  PHQ9: 3   OSA: CPAP followed by pulmonary.   He is working on weight loss...  has lost weight  on ozempic.Marland Kitchen is able to get one more prescription, but then would need  to change to wegovy. Wt Readings from Last 3 Encounters:  10/10/21 218 lb 3 oz (99 kg)  08/13/21 222 lb (100.7 kg)  04/07/21 220 lb 3.2 oz (99.9 kg)  Body mass index is 35.22 kg/m.        Relevant past medical, surgical, family and social history reviewed and updated as indicated. Interim medical history since our last visit reviewed. Allergies and medications reviewed and updated. Outpatient Medications Prior to Visit  Medication Sig Dispense Refill   acetaminophen (TYLENOL) 500 MG tablet Take 500 mg by mouth every 6 (six) hours as needed.      cholecalciferol (VITAMIN D) 25 MCG (1000 UNIT) tablet Take 1,000 Units by mouth daily.     Continuous Blood Gluc Receiver (FREESTYLE LIBRE 2 READER) DEVI Use to check blood sugar continuous as instructed. 1 each 0   Continuous Blood Gluc Sensor (FREESTYLE LIBRE 2 SENSOR) MISC USE TO CHECK BLOOD SUGAR CONTINUOUS AS DIRECTED. 1 each 5   Continuous Blood Gluc Transmit (DEXCOM G6 TRANSMITTER) MISC Check blood sugar as recommended. 1 each 0   desvenlafaxine (PRISTIQ) 100 MG 24 hr tablet Take 1 tablet (100 mg total) by mouth daily. 90 tablet 0   fexofenadine (ALLEGRA) 180 MG tablet Take 180 mg by mouth daily as needed for allergies.     fluticasone (FLONASE) 50 MCG/ACT nasal spray USE 2 SPRAYS IN EACH NOSTIL ONCE A DAY 48 g 3   glucose blood (FREESTYLE LITE) test strip Use as directed to check blood glucose. 100 strip 3   glucose monitoring kit (FREESTYLE) monitoring kit 1 each by Does not apply route as needed for other. 1 each 0   ibuprofen (ADVIL,MOTRIN) 600 MG tablet Take 400 mg by mouth 2 (  two) times daily.     indapamide (LOZOL) 2.5 MG tablet Take 1 tablet (2.5 mg total) by mouth daily. 90 tablet 3   ipratropium (ATROVENT) 0.03 % nasal spray Place 2 sprays into both nostrils every 12 (twelve) hours as needed for rhinitis.     propranolol (INDERAL) 10 MG tablet TAKE 1 TABLET BY MOUTH ONCE DAILY AS NEEDED 90 tablet 1   pseudoephedrine (SUDAFED) 60 MG tablet Take 60 mg by mouth every 4 (four) hours as needed for congestion.     Semaglutide,0.25 or 0.5MG/DOS, 2 MG/1.5ML SOPN Inject 0.25 mg into the skin once a week. 6 mL 3   Turmeric 450 MG CAPS Take 450 mg by mouth daily.     ipratropium (ATROVENT) 0.03 % nasal spray PLACE 2 SPRAYS INTO BOTH NOSTRILS EVERY 12 HOURS. (Patient taking differently: Place 2 sprays into both nostrils daily as needed for rhinitis.) 30 mL 11   COVID-19 At Home Antigen Test (CARESTART COVID-19 HOME TEST) KIT Use as directed 4 each 0   Loteprednol Etabonate (LOTEMAX SM) 0.38 %  GEL Place 1 drop into affected eye 3 times a day 5 g 0   nirmatrelvir & ritonavir (PAXLOVID, 300/100,) 20 x 150 MG & 10 x 100MG TBPK Take 3 tablets by mouth 2 (two) times daily for 5 days. 30 tablet 0   No facility-administered medications prior to visit.     Per HPI unless specifically indicated in ROS section below Review of Systems Objective:  BP 100/66    Pulse (!) 103    Temp 98.3 F (36.8 C) (Temporal)    Ht _0  (1.676 m)    Wt 218 lb 3 oz (99 kg)    SpO2 97%    BMI 35.22 kg/m   Wt Readings from Last 3 Encounters:  10/10/21 218 lb 3 oz (99 kg)  08/13/21 222 lb (100.7 kg)  04/07/21 220 lb 3.2 oz (99.9 kg)      Physical Exam    Results for orders placed or performed in visit on 10/01/21  Lipid panel  Result Value Ref Range   Cholesterol 217 (H) 0 - 200 mg/dL   Triglycerides 87.0 0.0 - 149.0 mg/dL   HDL 49.70 >39.00 mg/dL   VLDL 17.4 0.0 - 40.0 mg/dL   LDL Cholesterol 150 (H) 0 - 99 mg/dL   Total CHOL/HDL Ratio 4    NonHDL 167.47   Comprehensive metabolic panel  Result Value Ref Range   Sodium 140 135 - 145 mEq/L   Potassium 3.5 3.5 - 5.1 mEq/L   Chloride 99 96 - 112 mEq/L   CO2 33 (H) 19 - 32 mEq/L   Glucose, Bld 95 70 - 99 mg/dL   BUN 19 6 - 23 mg/dL   Creatinine, Ser 0.82 0.40 - 1.50 mg/dL   Total Bilirubin 0.4 0.2 - 1.2 mg/dL   Alkaline Phosphatase 54 39 - 117 U/L   AST 19 0 - 37 U/L   ALT 27 0 - 53 U/L   Total Protein 7.7 6.0 - 8.3 g/dL   Albumin 4.6 3.5 - 5.2 g/dL   GFR 107.70 >60.00 mL/min   Calcium 9.8 8.4 - 10.5 mg/dL  PSA  Result Value Ref Range   PSA 0.42 0.10 - 4.00 ng/mL  VITAMIN D 25 Hydroxy (Vit-D Deficiency, Fractures)  Result Value Ref Range   VITD 23.59 (L) 30.00 - 100.00 ng/mL    This visit occurred during the SARS-CoV-2 public health emergency.  Safety protocols were in place,  including screening questions prior to the visit, additional usage of staff PPE, and extensive cleaning of exam room while observing appropriate contact time as  indicated for disinfecting solutions.   COVID 19 screen:  No recent travel or known exposure to COVID19 The patient denies respiratory symptoms of COVID 19 at this time. The importance of social distancing was discussed today.   Assessment and Plan   The patient's preventative maintenance and recommended screening tests for an annual wellness exam were reviewed in full today. Brought up to date unless services declined.  Counselled on the importance of diet, exercise, and its role in overall health and mortality. The patient's FH and SH was reviewed, including their home life, tobacco status, and drug and alcohol status.   Vaccines: Uptodate Tdap and flu, has completed three COVID vaccine No family history of  first degree prostate cancer.    MGF prostate cancer Mother with mutiple polyps, MGM colon cancer, uncle large polyp. No symptoms. Colonoscopy:2020 precancerous polyps.. repeat in 5 years. Dr. Hilarie Fredrickson. No desire for STD screen.   No smoking   Problem List Items Addressed This Visit     Elevated transaminase level    Resolved with weight loss.      Major depressive disorder, recurrent, moderate (HCC)    Stable, chronic.  Continue current medication but start wean   Pristiq 50 mg XL daily.      Relevant Medications   desvenlafaxine (PRISTIQ) 50 MG 24 hr tablet   Obesity (BMI 30.0-34.9)     Improvement with ozempic 0.5 mg daily  Has lost 25 lbs per home scale since starting. Minimal SE.  Will increase to 1 mg weekly for continued loss.      Obstructive sleep apnea    Followed by pulmonary and managed with CPAP      PURE HYPERCHOLESTEROLEMIA    Stable, chronic.  Encouraged exercise, weight loss, healthy eating habits.         Vitamin D deficiency    Replete with Vit D OTC.      Other Visit Diagnoses     Routine general medical examination at a health care facility    -  Primary       Eliezer Lofts, MD

## 2021-10-10 NOTE — Assessment & Plan Note (Signed)
Resolved with weight loss.   

## 2021-10-10 NOTE — Patient Instructions (Addendum)
Increase dose of semaglutide to 1 mg weekly for weight management.  Use OTC vit D supplement 1-2 g daily.  Work on 150 min per week.  Decrease pristiq to 50 mg daily with polans to wean overall.

## 2021-10-10 NOTE — Assessment & Plan Note (Signed)
Followed by pulmonary and managed with CPAP

## 2021-10-10 NOTE — Assessment & Plan Note (Signed)
Replete with Vit D OTC.

## 2021-10-10 NOTE — Assessment & Plan Note (Signed)
Stable, chronic.  Encouraged exercise, weight loss, healthy eating habits.

## 2021-10-10 NOTE — Assessment & Plan Note (Signed)
Improvement with ozempic 0.5 mg daily  Has lost 25 lbs per home scale since starting. Minimal SE.  Will increase to 1 mg weekly for continued loss.

## 2021-10-15 DIAGNOSIS — H5211 Myopia, right eye: Secondary | ICD-10-CM | POA: Diagnosis not present

## 2021-10-21 ENCOUNTER — Other Ambulatory Visit: Payer: Self-pay

## 2021-10-21 ENCOUNTER — Other Ambulatory Visit (HOSPITAL_COMMUNITY): Payer: Self-pay

## 2021-10-21 ENCOUNTER — Ambulatory Visit (INDEPENDENT_AMBULATORY_CARE_PROVIDER_SITE_OTHER): Payer: 59 | Admitting: Podiatry

## 2021-10-21 ENCOUNTER — Ambulatory Visit (INDEPENDENT_AMBULATORY_CARE_PROVIDER_SITE_OTHER): Payer: 59

## 2021-10-21 VITALS — BP 104/72 | HR 78 | Resp 16

## 2021-10-21 DIAGNOSIS — M7752 Other enthesopathy of left foot: Secondary | ICD-10-CM

## 2021-10-21 DIAGNOSIS — M778 Other enthesopathies, not elsewhere classified: Secondary | ICD-10-CM

## 2021-10-21 NOTE — Progress Notes (Signed)
?Subjective:  ?Patient ID: Aaron Malone, male    DOB: Jan 20, 1976,  MRN: [id number] ? ?Chief Complaint  ?Patient presents with  ? Foot Pain  ?  "On my left foot, I have pain on the ball of my foot around the joint of the second and third toes."  ? ? ?46 y.o. male presents with the above complaint.  Patient presents with complaint left second metatarsophalangeal joint capsulitis.  Patient states it hurts in the ball of the foot.  He started walking on a elliptical and since then he started noticing the pain.  Since then he has also stopped doing elliptical which has also started self resolving the pain.  He states it feels a lot better as part is gotten worse.  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  Pain scale now is about 3 out of 10.  He wanted to just discuss options for this.  He wanted discuss insoles.  He denies seeing anyone else prior to seeing me. ? ? ?Review of Systems: Negative except as noted in the HPI. Denies N/V/F/Ch. ? ?Past Medical History:  ?Diagnosis Date  ? Carpal tunnel syndrome   ? bilateral  ? COVID-19 08/2020  ? Gallstones 2012  ? History of kidney stones   ? Hyperlipidemia   ? Major depressive disorder, single episode, moderate ([place])   ? Obesity   ? Sleep apnea   ? uses a CPAP  ? ? ?Current Outpatient Medications:  ?  acetaminophen (TYLENOL) 500 MG tablet, Take 500 mg by mouth every 6 (six) hours as needed., Disp: , Rfl:  ?  cholecalciferol (VITAMIN D) 25 MCG (1000 UNIT) tablet, Take 1,000 Units by mouth daily., Disp: , Rfl:  ?  Continuous Blood Gluc Receiver (FREESTYLE LIBRE 2 READER) DEVI, Use to check blood sugar continuous as instructed., Disp: 1 each, Rfl: 0 ?  Continuous Blood Gluc Sensor (FREESTYLE LIBRE 2 SENSOR) MISC, USE TO CHECK BLOOD SUGAR CONTINUOUS AS DIRECTED., Disp: 1 each, Rfl: 5 ?  Continuous Blood Gluc Transmit (DEXCOM G6 TRANSMITTER) MISC, Check blood sugar as recommended., Disp: 1 each, Rfl: 0 ?  desvenlafaxine (PRISTIQ) 50 MG 24 hr tablet, Take 2 tablets (100 mg  total) by mouth daily., Disp: 90 tablet, Rfl: 1 ?  fexofenadine (ALLEGRA) 180 MG tablet, Take 180 mg by mouth daily as needed for allergies., Disp: , Rfl:  ?  fluticasone (FLONASE) 50 MCG/ACT nasal spray, USE 2 SPRAYS IN EACH NOSTIL ONCE A DAY, Disp: 48 g, Rfl: 3 ?  glucose blood (FREESTYLE LITE) test strip, Use as directed to check blood glucose., Disp: 100 strip, Rfl: 3 ?  glucose monitoring kit (FREESTYLE) monitoring kit, 1 each by Does not apply route as needed for other., Disp: 1 each, Rfl: 0 ?  ibuprofen (ADVIL,MOTRIN) 600 MG tablet, Take 400 mg by mouth 2 (two) times daily., Disp: , Rfl:  ?  indapamide (LOZOL) 2.5 MG tablet, Take 1 tablet (2.5 mg total) by mouth daily., Disp: 90 tablet, Rfl: 3 ?  ipratropium (ATROVENT) 0.03 % nasal spray, Place 2 sprays into both nostrils every 12 (twelve) hours as needed for rhinitis., Disp: , Rfl:  ?  propranolol (INDERAL) 10 MG tablet, TAKE 1 TABLET BY MOUTH ONCE DAILY AS NEEDED, Disp: 90 tablet, Rfl: 1 ?  pseudoephedrine (SUDAFED) 60 MG tablet, Take 60 mg by mouth every 4 (four) hours as needed for congestion., Disp: , Rfl:  ?  Semaglutide, 1 MG/DOSE, 4 MG/3ML SOPN, Inject 1 mg as directed once  a week., Disp: 9 mL, Rfl: 0 ?  Turmeric 450 MG CAPS, Take 450 mg by mouth daily., Disp: , Rfl:  ? ?Social History  ? ?Tobacco Use  ?Smoking Status Never  ?Smokeless Tobacco Never  ? ? ?No Known Allergies ?Objective:  ? ?Vitals:  ? 10/21/21 0947  ?BP: 104/72  ?Pulse: 78  ?Resp: 16  ? ?There is no height or weight on file to calculate BMI. ?Constitutional Well developed. ?Well nourished.  ?Vascular Dorsalis pedis pulses palpable bilaterally. ?Posterior tibial pulses palpable bilaterally. ?Capillary refill normal to all digits.  ?No cyanosis or clubbing noted. ?Pedal hair growth normal.  ?Neurologic Normal speech. ?Oriented to person, place, and time. ?Epicritic sensation to light touch grossly present bilaterally.  ?Dermatologic Nails well groomed and normal in appearance. ?No open  wounds. ?No skin lesions.  ?Orthopedic: Pain on palpation to the left second metatarsal phalangeal joint mild to moderate.  No pain with range of motion of the joint.  No pain with resisted dorsiflexion or plantarflexion of the digit.  ? ?Radiographs: 3 views of skeletally mature adult left foot: No stress fracture noted no bony abnormalities noted.  Pes cavus foot structure noted.  Heel spurring noted ?Assessment:  ? ?1. Capsulitis of metatarsophalangeal (MTP) joint of left foot   ? ?Plan:  ?Patient was evaluated and treated and all questions answered. ? ?Left second metatarsophalangeal joint capsulitis ?-All questions and concerns were discussed with the patient in extensive detail given that he is improving simply by discontinuing elliptical I discussed with the patient to continue managing it until has completely resolved.  I gave him some metatarsal pad as well as discussed shoe gear modification and insoles.  I discussed power steps with him.  If there is no improvement we will discuss custom insoles during next visit.  Patient states understanding. ? ?No follow-ups on file.  ?

## 2021-11-03 DIAGNOSIS — G4733 Obstructive sleep apnea (adult) (pediatric): Secondary | ICD-10-CM | POA: Diagnosis not present

## 2021-11-07 ENCOUNTER — Encounter: Payer: Self-pay | Admitting: Family Medicine

## 2021-11-12 ENCOUNTER — Encounter: Payer: Self-pay | Admitting: Primary Care

## 2021-11-12 ENCOUNTER — Ambulatory Visit: Payer: 59 | Admitting: Primary Care

## 2021-11-12 ENCOUNTER — Ambulatory Visit (INDEPENDENT_AMBULATORY_CARE_PROVIDER_SITE_OTHER): Payer: 59 | Admitting: Primary Care

## 2021-11-12 DIAGNOSIS — G4733 Obstructive sleep apnea (adult) (pediatric): Secondary | ICD-10-CM | POA: Diagnosis not present

## 2021-11-12 NOTE — Patient Instructions (Addendum)
Nice to meet you Mr Aaron Malone, great job wearing CPAP ?No changes today ?Continue to wear CPAP every night for 4-6 hours or longer ?Do not drive if experiencing excessive daytime sleepiness ? ?Follow-up: ?1 year with Dr. Annamaria Boots or sooner if needed  ? ? ? ?

## 2021-11-12 NOTE — Assessment & Plan Note (Signed)
-   Patient is 100% compliant with CPAP and reports benefit from use. He received new CPAP machine, Resmed 11. No issues with pressure setting or mask fit. Current CPAP Pressure 5-20cm h20; Residual AHI 0.2. No changes today. Encourage patient continue to wear CPAP every night for 4-6 hours or longer. Advised against driving if experiencing excessive daytime sleepiness. FU in 1 year or sooner if needed  ?

## 2021-11-12 NOTE — Progress Notes (Signed)
? ?_0  ID: Aaron Malone, male    DOB: 23-Jul-1976, 46 y.o.   MRN: [id number] ? ?Chief Complaint  ?Patient presents with  ? Follow-up  ?  Wearing CPAP-doing well has new CPAP  ? ? ?Referring provider: ?Jinny Sanders, MD ? ?HPI: ?46 year old male, never smoked. PMH significant for OSA, pulmonary granuloma, seasonal/perennial allergic rhinitis, fatty liver, vit D, obesity. Patient of Dr. Annamaria Boots, last seen on 04/07/21.  ? 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 Uses Travel machine some.Sleeps better with CPAP and prefers the humidifier of main machine over disc in [employer] travel machine.  ?Dealing with kidney stones. ?CXR 07/12/21-  ?IMPRESSION:  ?Suggestion of bronchitis, especially on the left.  No focal  ?infiltrate identified.  ? ?11/12/2021- Interim hx  ?Patient presents today for annual follow-up for OSA. He is doing well, no acute complaints. No issues with mask fit or pressure setting. Since last visit he has received a new CPAP. He is 100% compliant with use. Current machines is resmed 11. He has a travel CPAP machine, he will use this on average 1 day a month. Uses nasal pillow mask size large. Denies daytime sleepiness or fatigue. DME company is [employer].  ? ?Airview download 10/12/21-11/10/21 ?30/30 days used (100%); 30 days (100%) > 4 hours ?Average usage days used 7 hours 50 mins ?Pressure 5-20cm (10.6L/min) ?Airleaks 1.4L/min  ?AHI 0.2 ? ?No Known Allergies ? ?Immunization History  ?Administered Date(s) Administered  ? Influenza Split 05/17/2016  ? Influenza Whole 05/17/2008, 04/16/2009, 06/04/2010  ? Influenza,inj,Quad PF,6+ Mos 05/17/2015, 05/14/2017, 05/16/2018,  04/18/2019, 05/16/2021  ? Influenza,inj,quad, With Preservative 04/26/2018  ? Moderna SARS-COV2 Booster Vaccination 08/28/2020  ? PFIZER(Purple Top)SARS-COV-2 Vaccination 09/07/2019, 09/28/2019  ? Td 08/17/1996, 06/15/2007  ? Tdap 08/06/2017  ? ? ?Past Medical History:  ?Diagnosis Date  ? Carpal tunnel syndrome   ? bilateral  ? COVID-19 08/2020  ? Gallstones 2012  ? History of kidney stones   ? Hyperlipidemia   ? Major depressive disorder, single episode, moderate ([place])   ? Obesity   ? Sleep apnea   ? uses a CPAP  ? ? ?Tobacco History: ?Social History  ? ?Tobacco Use  ?Smoking Status Never  ?Smokeless Tobacco Never  ? ?Counseling given: Not Answered ? ? ?Outpatient Medications Prior to Visit  ?Medication Sig Dispense Refill  ? acetaminophen (TYLENOL) 500 MG tablet Take 500 mg by mouth every 6 (six) hours as needed.    ? cholecalciferol (VITAMIN D) 25 MCG (1000 UNIT) tablet Take 1,000 Units by mouth daily.    ? Continuous Blood Gluc Receiver (FREESTYLE LIBRE 2 READER) DEVI Use to check blood sugar continuous as instructed. 1 each 0  ? Continuous Blood Gluc Sensor (FREESTYLE LIBRE 2 SENSOR) MISC USE TO CHECK BLOOD SUGAR CONTINUOUS AS DIRECTED. 1 each 5  ? Continuous Blood Gluc Transmit (DEXCOM G6 TRANSMITTER) MISC Check blood sugar as recommended. 1 each 0  ? desvenlafaxine (PRISTIQ) 50 MG 24 hr tablet Take 2 tablets (100 mg total) by mouth daily. 90 tablet 1  ? fexofenadine (ALLEGRA) 180 MG tablet Take 180 mg by mouth daily as needed for allergies.    ? glucose blood (FREESTYLE LITE) test strip Use as directed to check blood glucose. 100  strip 3  ? glucose monitoring kit (FREESTYLE) monitoring kit 1 each by Does not apply route as needed for other. 1 each 0  ? ibuprofen (ADVIL,MOTRIN) 600 MG tablet Take 400 mg by mouth 2 (two) times daily.    ? indapamide (LOZOL) 2.5 MG tablet Take 1 tablet (2.5 mg total) by mouth daily. 90 tablet 3  ? ipratropium (ATROVENT) 0.03 % nasal spray Place 2 sprays into both nostrils every  12 (twelve) hours as needed for rhinitis.    ? propranolol (INDERAL) 10 MG tablet TAKE 1 TABLET BY MOUTH ONCE DAILY AS NEEDED 90 tablet 1  ? pseudoephedrine (SUDAFED) 60 MG tablet Take 60 mg by mouth every 4 (four) hours as needed for congestion.    ? Semaglutide, 1 MG/DOSE, 4 MG/3ML SOPN Inject 1 mg as directed once a week. 9 mL 0  ? Turmeric 450 MG CAPS Take 450 mg by mouth daily.    ? fluticasone (FLONASE) 50 MCG/ACT nasal spray USE 2 SPRAYS IN EACH NOSTIL ONCE A DAY 48 g 3  ? ?No facility-administered medications prior to visit.  ? ? ? ? ?Review of Systems ? ?Review of Systems  ?Constitutional: Negative.  Negative for fatigue.  ?HENT: Negative.    ?Respiratory: Negative.    ?Psychiatric/Behavioral: Negative.  Negative for sleep disturbance.   ? ? ?Physical Exam ? ?BP 136/68 (BP Location: Right Arm, Cuff Size: Normal)   Pulse 95   Temp 97.8 ?F (36.6 ?C) (Temporal)   Ht 5' 6" (1.676 m)   Wt 221 lb 6.4 oz (100.4 kg)   SpO2 96%   BMI 35.73 kg/m?  ?Physical Exam ?Constitutional:   ?   Appearance: Normal appearance.  ?HENT:  ?   Head: Normocephalic and atraumatic.  ?   Mouth/Throat:  ?   Mouth: Mucous membranes are moist.  ?   Pharynx: Oropharynx is clear.  ?Cardiovascular:  ?   Rate and Rhythm: Normal rate and regular rhythm.  ?Pulmonary:  ?   Effort: Pulmonary effort is normal.  ?   Breath sounds: Normal breath sounds.  ?Musculoskeletal:     ?   General: Normal range of motion.  ?   Cervical back: Normal range of motion and neck supple.  ?Skin: ?   General: Skin is warm and dry.  ?Neurological:  ?   General: No focal deficit present.  ?   Mental Status: He is alert and oriented to person, place, and time. Mental status is at baseline.  ?  ? ?Lab Results: ? ?CBC ?   ?Component Value Date/Time  ? WBC 10.9 (H) 11/24/2020 0538  ? RBC 4.33 11/24/2020 0538  ? HGB 14.1 11/24/2020 0538  ? HCT 40.7 11/24/2020 0538  ? PLT 354 11/24/2020 0538  ? MCV 94.0 11/24/2020 0538  ? MCH 32.6 11/24/2020 0538  ? MCHC 34.6 11/24/2020  0538  ? RDW 12.0 11/24/2020 0538  ? LYMPHSABS 1.2 07/13/2011 1651  ? MONOABS 0.6 07/13/2011 1651  ? EOSABS 0.0 07/13/2011 1651  ? BASOSABS 0.0 07/13/2011 1651  ? ? ?BMET ?   ?Component Value Date/Time  ? NA 140 10/01/2021 0730  ? K 3.5 10/01/2021 0730  ? CL 99 10/01/2021 0730  ? CO2 33 (H) 10/01/2021 0730  ? GLUCOSE 95 10/01/2021 0730  ? BUN 19 10/01/2021 0730  ? CREATININE 0.82 10/01/2021 0730  ? CALCIUM 9.8 10/01/2021 0730  ? GFRNONAA >60 11/24/2020 0538  ? GFRAA >90 08/22/2014 0835  ? ? ?BNP ?No results found for: BNP ? ?  ProBNP ?No results found for: PROBNP ? ?Imaging: ?DG Foot Complete Left ? ?Result Date: 10/21/2021 ?Please see detailed radiograph report in office note.  ? ? ?Assessment & Plan:  ? ?Obstructive sleep apnea ?- Patient is 100% compliant with CPAP and reports benefit from use. He received new CPAP machine, Resmed 11. No issues with pressure setting or mask fit. Current CPAP Pressure 5-20cm h20; Residual AHI 0.2. No changes today. Encourage patient continue to wear CPAP every night for 4-6 hours or longer. Advised against driving if experiencing excessive daytime sleepiness. FU in 1 year or sooner if needed  ? ? ? ? ?Martyn Ehrich, NP ?11/12/2021 ? ?

## 2021-11-14 ENCOUNTER — Other Ambulatory Visit: Payer: Self-pay

## 2021-11-14 MED ORDER — CARESTART COVID-19 HOME TEST VI KIT
PACK | 0 refills | Status: DC
  Filled 2021-11-14: qty 2, 4d supply, fill #0

## 2021-11-17 ENCOUNTER — Other Ambulatory Visit (HOSPITAL_COMMUNITY): Payer: Self-pay

## 2021-11-18 ENCOUNTER — Other Ambulatory Visit (HOSPITAL_COMMUNITY): Payer: Self-pay

## 2021-11-25 ENCOUNTER — Other Ambulatory Visit: Payer: Self-pay

## 2021-11-25 DIAGNOSIS — N200 Calculus of kidney: Secondary | ICD-10-CM

## 2021-11-26 NOTE — Progress Notes (Signed)
? ?11/27/21 ?8:16 AM  ? ?Aaron Malone ?05-Apr-1976 ?[id number] ? ?Referring provider:  ?Jinny Sanders, MD ?[facility] ?[place],  [place] [address] ?Chief Complaint  ?Patient presents with  ? Nephrolithiasis  ? ? ?HPI: ?Aaron Malone is a 46 y.o.male with a personal history of recurrent stone disease with hypercalciuria on 2.5 mg indapamide daily who presents today for further evaluation of frequency/urgency/ stone symptoms.  ? ?He underwent Litholink that revealed  decreased urine volume at 1.95 L/day, increased supersaturation of calcium oxalate, persistent hypercalciuria, now 436 mg/day but previously 510 mg/day and elevated urine sodium 256 mmol/day. ? ?He was seen in clinic on 08/13/2021 by Debroah Loop, PA-C, at the time he had reportedly passes a stone two days prior. Stone analysis showed 50% calcium oxalate monohydrate, 10% calcium oxalate dihydrate, 20% hydroxyapatite, 20%, 20 % CaHPO4.  ? ?KUB was personally reviewed and interpreted today and visualized slight enlargement of bilateral stones on the left about 3 mm on the right  about 5 mm. ? ?He reports that he has mild discomfort on the right side and urgency he has seen a little blood. He denies fever, chills, or burning.  He is worried he might passing a stone. ? ?UA today has >30 RBCs.  ? ?PMH: ?Past Medical History:  ?Diagnosis Date  ? Carpal tunnel syndrome   ? bilateral  ? COVID-19 08/2020  ? Gallstones 2012  ? History of kidney stones   ? Hyperlipidemia   ? Major depressive disorder, single episode, moderate ([facility])   ? Obesity   ? Sleep apnea   ? uses a CPAP  ? ? ?Surgical History: ?Past Surgical History:  ?Procedure Laterality Date  ? CARPAL TUNNEL RELEASE Bilateral 08/24/2014  ? Procedure: RIGHT LIMITED OPEN CARPAL TUNNEL RELEASE, LEFT CARPAL  TUNNEL INJECTION;  Surgeon: Roseanne Kaufman, MD;  Location: [facility];  Service: Orthopedics;  Laterality: Bilateral;  ? CARPAL TUNNEL RELEASE Left 06/20/2015  ? Procedure:  LEFT CARPAL TUNNEL RELEASE;  Surgeon: Roseanne Kaufman, MD;  Location: [facility];  Service: Orthopedics;  Laterality: Left;  ? CHOLECYSTECTOMY  2012  ? COLONOSCOPY    ? CYSTOSCOPY W/ RETROGRADES Bilateral 12/16/2020  ? Procedure: CYSTOSCOPY WITH RETROGRADE PYELOGRAM;  Surgeon: Hollice Espy, MD;  Location: [facility];  Service: Urology;  Laterality: Bilateral;  ? CYSTOSCOPY/URETEROSCOPY/HOLMIUM LASER/STENT PLACEMENT Right 12/16/2020  ? Procedure: CYSTOSCOPY/URETEROSCOPY/HOLMIUM LASER/STENT PLACEMENT;  Surgeon: Hollice Espy, MD;  Location: [facility];  Service: Urology;  Laterality: Right;  ? EXTRACORPOREAL SHOCK WAVE LITHOTRIPSY Right 10/31/2020  ? Procedure: EXTRACORPOREAL SHOCK WAVE LITHOTRIPSY (ESWL);  Surgeon: Hollice Espy, MD;  Location: [facility];  Service: Urology;  Laterality: Right;  ? INGUINAL HERNIA REPAIR Bilateral 1983  ? LITHOTRIPSY  12/2003, 09/2006  ? TRIGGER FINGER RELEASE    ? thumb  ? ? ?Home Medications:  ?Allergies as of 11/27/2021   ?No Known Allergies ?  ? ?  ?Medication List  ?  ? ?  ? Accurate as of November 27, 2021 11:59 PM. If you have any questions, ask your nurse or doctor.  ?  ?  ? ?  ? ?acetaminophen 500 MG tablet ?Commonly known as: TYLENOL ?Take 500 mg by mouth every 6 (six) hours as needed. ?  ?Carestart COVID-19 Home Test Kit ?Generic drug: COVID-19 At Home Antigen Test ?Use as directed ?  ?cholecalciferol 25 MCG (1000 UNIT) tablet ?Commonly known as: VITAMIN D ?Take 1,000 Units by mouth daily. ?  ?desvenlafaxine 50 MG 24 hr  tablet ?Commonly known as: PRISTIQ ?Take 2 tablets (100 mg total) by mouth daily. ?  ?Dexcom G6 Transmitter Misc ?Check blood sugar as recommended. ?  ?fexofenadine 180 MG tablet ?Commonly known as: ALLEGRA ?Take 180 mg by mouth daily as needed for allergies. ?  ?fluticasone 50 MCG/ACT nasal spray ?Commonly known as: FLONASE ?USE 2 SPRAYS IN EACH NOSTIL ONCE A DAY ?  ?FreeStyle [place] 2 Reader Kerrin Mo ?Use to check blood sugar continuous as instructed. ?   ?FreeStyle Libre 2 Sensor Misc ?USE TO CHECK BLOOD SUGAR CONTINUOUS AS DIRECTED. ?  ?FREESTYLE LITE test strip ?Generic drug: glucose blood ?Use as directed to check blood glucose. ?  ?glucose monitoring kit monitoring kit ?1 each by Does not apply route as needed for other. ?  ?ibuprofen 600 MG tablet ?Commonly known as: ADVIL ?Take 400 mg by mouth 2 (two) times daily. ?  ?indapamide 2.5 MG tablet ?Commonly known as: LOZOL ?Take 1 tablet (2.5 mg total) by mouth daily. ?  ?ipratropium 0.03 % nasal spray ?Commonly known as: ATROVENT ?Place 2 sprays into both nostrils every 12 (twelve) hours as needed for rhinitis. ?  ?Ozempic (1 MG/DOSE) 4 MG/3ML Sopn ?Generic drug: Semaglutide (1 MG/DOSE) ?Inject 1 mg as directed once a week. ?  ?propranolol 10 MG tablet ?Commonly known as: INDERAL ?TAKE 1 TABLET BY MOUTH ONCE DAILY AS NEEDED ?  ?pseudoephedrine 60 MG tablet ?Commonly known as: SUDAFED ?Take 60 mg by mouth every 4 (four) hours as needed for congestion. ?  ?tamsulosin 0.4 MG Caps capsule ?Commonly known as: FLOMAX ?Take 1 capsule (0.4 mg total) by mouth daily. ?Started by: Hollice Espy, MD ?  ?Turmeric 450 MG Caps ?Take 450 mg by mouth daily. ?  ? ?  ? ? ?Allergies:  ?No Known Allergies ? ?Family History: ?Family History  ?Problem Relation Age of Onset  ? Hyperlipidemia Mother   ? Hypertension Mother   ? Irritable bowel syndrome Mother   ? Colon polyps Mother   ?     x lots  ? Polycystic ovary syndrome Sister   ? Colon cancer Maternal Grandmother   ?     dx in her 45's or 66's  ? Lung cancer Maternal Grandmother   ? Heart failure Maternal Grandfather   ? Heart disease Maternal Grandfather   ? Prostate cancer Maternal Grandfather   ? Multiple myeloma Paternal Grandfather   ? Diabetes Paternal Grandmother   ? Colon polyps Maternal Uncle   ? ? ?Social History:  reports that he has never smoked. He has never used smokeless tobacco. He reports current alcohol use. He reports that he does not use drugs. ? ? ?Physical  Exam: ?BP 132/80   Pulse 85   Ht 5' 7"  (1.702 m)   Wt 213 lb (96.6 kg)   BMI 33.36 kg/m?   ?Constitutional:  Alert and oriented, No acute distress. ?HEENT: [place] AT, moist mucus membranes.  Trachea midline, no masses. ?Cardiovascular: No clubbing, cyanosis, or edema. ?Respiratory: Normal respiratory effort, no increased work of breathing. ?Skin: No rashes, bruises or suspicious lesions. ?Neurologic: Grossly intact, no focal deficits, moving all 4 extremities. ?Psychiatric: Normal mood and affect. ? ?Laboratory Data: ?Lab Results  ?Component Value Date  ? CREATININE 0.82 10/01/2021  ? ?Lab Results  ?Component Value Date  ? HGBA1C 5.8 09/29/2019  ? ? ?Urinalysis ?>30 RBCs ? ?Pertinent Imaging: ?KUB was personally reviewed and interpreted today see HPI.  No obvious ureteral stones.  Slightly enlarging nonobstructing bilateral stone burden. ? ?Assessment &  Plan:   ? ?Recurrent stone disease  ?- Suspected spontaneous passage of stone, likely small based on KUB today ?- UA today showed microscopic hematuria ?- Recommend NSAIDS to manage pain  ?- Discussed addition of Flomax to his regimen. He is interested in the medication.  ?- Flomax; prescribed ?- Return in 6 months for KUB to monitor.  ?- We reviewed general stone prevention techniques including drinking plenty water with goal of producing 2.5 L urine daily, increased citric acid intake, avoidance of high oxalate containing foods, and decreased salt intake.  Information about dietary recommendations given today.  ? ?2. Bilateral renal stones  ?- KUB was personally reviewed and showed slight enlargement of bilateral stones  ?- Will monitor with KUB in 6 months  ? ? ?Conley Rolls as a scribe for Hollice Espy, MD.,have documented all relevant documentation on the behalf of Hollice Espy, MD,as directed by  Hollice Espy, MD while in the presence of Hollice Espy, MD. ? ?I have reviewed the above documentation for accuracy and completeness, and I agree  with the above.  ? ?Hollice Espy, MD ? ? ?[facility] ?[address], [address] ?[place], [place] [address] ?[phone] ?

## 2021-11-27 ENCOUNTER — Ambulatory Visit (INDEPENDENT_AMBULATORY_CARE_PROVIDER_SITE_OTHER): Payer: 59 | Admitting: Urology

## 2021-11-27 ENCOUNTER — Ambulatory Visit
Admission: RE | Admit: 2021-11-27 | Discharge: 2021-11-27 | Disposition: A | Discharge status: Home / Self Care | Payer: 59 | Source: Ambulatory Visit | Attending: Urology | Admitting: Urology

## 2021-11-27 ENCOUNTER — Other Ambulatory Visit: Payer: Self-pay

## 2021-11-27 ENCOUNTER — Encounter: Payer: Self-pay | Admitting: Urology

## 2021-11-27 ENCOUNTER — Ambulatory Visit
Admission: RE | Admit: 2021-11-27 | Discharge: 2021-11-27 | Disposition: A | Discharge status: Home / Self Care | Payer: 59 | Attending: Urology | Admitting: Urology

## 2021-11-27 VITALS — BP 132/80 | HR 85 | Ht 67.0 in | Wt 213.0 lb

## 2021-11-27 DIAGNOSIS — N200 Calculus of kidney: Secondary | ICD-10-CM | POA: Diagnosis not present

## 2021-11-27 DIAGNOSIS — R109 Unspecified abdominal pain: Secondary | ICD-10-CM | POA: Diagnosis not present

## 2021-11-27 LAB — MICROSCOPIC EXAMINATION: RBC, Urine: >30 /hpf — AB (ref 0–2)

## 2021-11-27 LAB — URINALYSIS, COMPLETE
Bilirubin, UA: NEGATIVE
Glucose, UA: NEGATIVE
Ketones, UA: NEGATIVE
Nitrite, UA: NEGATIVE
Protein,UA: NEGATIVE
Specific Gravity, UA: 1.02 (ref 1.005–1.030)
Urobilinogen, Ur: 0.2 mg/dL (ref 0.2–1.0)
pH, UA: 7 (ref 5.0–7.5)

## 2021-11-27 MED ORDER — TAMSULOSIN HCL 0.4 MG PO CAPS
0.4000 mg | ORAL_CAPSULE | Freq: Every day | ORAL | 0 refills | Status: DC
  Filled 2021-11-27: qty 30, 30d supply, fill #0

## 2021-11-28 ENCOUNTER — Other Ambulatory Visit (HOSPITAL_COMMUNITY): Payer: Self-pay

## 2021-11-28 ENCOUNTER — Encounter: Payer: Self-pay | Admitting: Urology

## 2021-11-29 ENCOUNTER — Other Ambulatory Visit (HOSPITAL_COMMUNITY): Payer: Self-pay

## 2021-11-29 ENCOUNTER — Other Ambulatory Visit: Payer: Self-pay | Admitting: Family Medicine

## 2021-12-01 ENCOUNTER — Other Ambulatory Visit (HOSPITAL_COMMUNITY): Payer: Self-pay

## 2021-12-01 ENCOUNTER — Other Ambulatory Visit: Payer: Self-pay | Admitting: Family Medicine

## 2021-12-02 ENCOUNTER — Other Ambulatory Visit (HOSPITAL_COMMUNITY): Payer: Self-pay

## 2021-12-02 MED ORDER — DESVENLAFAXINE SUCCINATE ER 100 MG PO TB24
100.0000 mg | ORAL_TABLET | Freq: Every day | ORAL | 1 refills | Status: DC
  Filled 2021-12-02 – 2021-12-13 (×3): qty 90, 90d supply, fill #0
  Filled 2022-03-31: qty 80, 80d supply, fill #0

## 2021-12-02 NOTE — Telephone Encounter (Signed)
Per note on refill: ? ?Pt states they were unable to reduce dose to '50mg'$  and would like to resume '100mg'$  1 daily, vs '50mg'$  2 daily. ? ?Okay to refill 100 mg? ?

## 2021-12-03 ENCOUNTER — Ambulatory Visit: Payer: 59 | Admitting: Urology

## 2021-12-04 ENCOUNTER — Other Ambulatory Visit (HOSPITAL_COMMUNITY): Payer: Self-pay

## 2021-12-04 DIAGNOSIS — G4733 Obstructive sleep apnea (adult) (pediatric): Secondary | ICD-10-CM | POA: Diagnosis not present

## 2021-12-10 ENCOUNTER — Encounter: Payer: Self-pay | Admitting: Family Medicine

## 2021-12-12 ENCOUNTER — Other Ambulatory Visit: Payer: Self-pay

## 2021-12-12 ENCOUNTER — Other Ambulatory Visit: Payer: Self-pay | Admitting: Family Medicine

## 2021-12-12 MED ORDER — SEMAGLUTIDE-WEIGHT MANAGEMENT 2.4 MG/0.75ML ~~LOC~~ SOAJ
2.4000 mg | SUBCUTANEOUS | 11 refills | Status: DC
Start: 2021-12-12 — End: 2022-10-12
  Filled 2021-12-12 – 2021-12-15 (×3): qty 3, 28d supply, fill #0
  Filled 2021-12-16: qty 3, fill #0
  Filled 2021-12-16: qty 3, 28d supply, fill #0
  Filled 2022-01-08: qty 9, 84d supply, fill #1
  Filled 2022-03-20: qty 9, 84d supply, fill #2
  Filled 2022-06-14: qty 9, 84d supply, fill #3
  Filled 2022-07-13 – 2022-09-14 (×3): qty 6, 56d supply, fill #4

## 2021-12-13 ENCOUNTER — Other Ambulatory Visit (HOSPITAL_COMMUNITY): Payer: Self-pay

## 2021-12-14 ENCOUNTER — Telehealth: Payer: Self-pay | Admitting: *Deleted

## 2021-12-14 NOTE — Telephone Encounter (Signed)
Received fax from [facility] requesting PA for Aaron Malone.  PA completed on CoverMyMeds and sent for review.  Can take up to 72 hours for a decision.  ?

## 2021-12-15 ENCOUNTER — Other Ambulatory Visit (HOSPITAL_COMMUNITY): Payer: Self-pay

## 2021-12-16 ENCOUNTER — Other Ambulatory Visit (HOSPITAL_COMMUNITY): Payer: Self-pay

## 2021-12-16 ENCOUNTER — Other Ambulatory Visit: Payer: Self-pay

## 2021-12-18 ENCOUNTER — Other Ambulatory Visit: Payer: Self-pay

## 2021-12-18 NOTE — Telephone Encounter (Signed)
Form received via fax requesting more information. I routed form to s drive for review.  ?

## 2021-12-18 NOTE — Telephone Encounter (Signed)
Form completed and faxed to [facility] at [phone].  Aaron Malone notified via MyChart that additional information was needed for PA and form was faxed back today.  ?

## 2021-12-19 ENCOUNTER — Other Ambulatory Visit: Payer: Self-pay

## 2021-12-22 ENCOUNTER — Other Ambulatory Visit: Payer: Self-pay

## 2021-12-24 ENCOUNTER — Other Ambulatory Visit: Payer: Self-pay

## 2021-12-24 MED ORDER — COVID-19 AT HOME ANTIGEN TEST VI KIT
PACK | 0 refills | Status: DC
Start: 2021-12-24 — End: 2022-10-13
  Filled 2021-12-24: qty 2, 4d supply, fill #0

## 2021-12-29 ENCOUNTER — Other Ambulatory Visit (HOSPITAL_COMMUNITY): Payer: Self-pay

## 2022-01-03 DIAGNOSIS — G4733 Obstructive sleep apnea (adult) (pediatric): Secondary | ICD-10-CM | POA: Diagnosis not present

## 2022-01-08 ENCOUNTER — Other Ambulatory Visit: Payer: Self-pay

## 2022-01-13 ENCOUNTER — Other Ambulatory Visit: Payer: Self-pay

## 2022-01-19 ENCOUNTER — Other Ambulatory Visit (HOSPITAL_COMMUNITY): Payer: Self-pay

## 2022-02-02 DIAGNOSIS — G4733 Obstructive sleep apnea (adult) (pediatric): Secondary | ICD-10-CM | POA: Diagnosis not present

## 2022-02-03 DIAGNOSIS — G4733 Obstructive sleep apnea (adult) (pediatric): Secondary | ICD-10-CM | POA: Diagnosis not present

## 2022-02-13 ENCOUNTER — Other Ambulatory Visit (HOSPITAL_COMMUNITY): Payer: Self-pay

## 2022-03-02 ENCOUNTER — Other Ambulatory Visit (HOSPITAL_COMMUNITY): Payer: Self-pay

## 2022-03-03 ENCOUNTER — Other Ambulatory Visit (HOSPITAL_COMMUNITY): Payer: Self-pay

## 2022-03-04 DIAGNOSIS — G4733 Obstructive sleep apnea (adult) (pediatric): Secondary | ICD-10-CM | POA: Diagnosis not present

## 2022-03-20 ENCOUNTER — Other Ambulatory Visit: Payer: Self-pay

## 2022-03-24 ENCOUNTER — Other Ambulatory Visit (HOSPITAL_COMMUNITY): Payer: Self-pay

## 2022-03-24 ENCOUNTER — Encounter (HOSPITAL_COMMUNITY): Payer: Self-pay

## 2022-03-25 ENCOUNTER — Other Ambulatory Visit: Payer: Self-pay

## 2022-03-30 ENCOUNTER — Other Ambulatory Visit: Payer: Self-pay | Admitting: Family Medicine

## 2022-03-31 ENCOUNTER — Other Ambulatory Visit: Payer: Self-pay

## 2022-03-31 ENCOUNTER — Other Ambulatory Visit (HOSPITAL_COMMUNITY): Payer: Self-pay

## 2022-03-31 MED ORDER — PROPRANOLOL HCL 10 MG PO TABS: ORAL_TABLET | Freq: Every day | ORAL | 1 refills | Status: DC | PRN

## 2022-04-01 ENCOUNTER — Other Ambulatory Visit: Payer: Self-pay

## 2022-04-02 ENCOUNTER — Encounter: Payer: Self-pay | Admitting: Family Medicine

## 2022-04-08 ENCOUNTER — Ambulatory Visit: Payer: 59 | Admitting: Internal Medicine

## 2022-04-20 ENCOUNTER — Other Ambulatory Visit: Payer: Self-pay | Admitting: Physician Assistant

## 2022-04-21 ENCOUNTER — Other Ambulatory Visit (HOSPITAL_COMMUNITY): Payer: Self-pay

## 2022-04-21 MED ORDER — INDAPAMIDE 2.5 MG PO TABS
2.5000 mg | ORAL_TABLET | Freq: Every day | ORAL | 3 refills | Status: DC
  Filled 2022-04-21: qty 90, 90d supply, fill #0
  Filled 2022-07-18: qty 90, 90d supply, fill #1
  Filled 2022-10-19: qty 90, 90d supply, fill #2
  Filled 2023-01-16: qty 90, 90d supply, fill #3

## 2022-05-22 ENCOUNTER — Encounter: Payer: Self-pay | Admitting: Family Medicine

## 2022-05-27 ENCOUNTER — Ambulatory Visit: Payer: 59 | Admitting: Family Medicine

## 2022-05-27 ENCOUNTER — Encounter: Payer: Self-pay | Admitting: Family Medicine

## 2022-05-27 VITALS — BP 128/80 | HR 83 | Temp 98.0°F | Ht 67.0 in | Wt 217.4 lb

## 2022-05-27 DIAGNOSIS — S30860A Insect bite (nonvenomous) of lower back and pelvis, initial encounter: Secondary | ICD-10-CM | POA: Diagnosis not present

## 2022-05-27 DIAGNOSIS — W57XXXA Bitten or stung by nonvenomous insect and other nonvenomous arthropods, initial encounter: Secondary | ICD-10-CM | POA: Diagnosis not present

## 2022-05-27 NOTE — Progress Notes (Signed)
Patient ID: Aaron Malone, male    DOB: [birth year], 46 y.o.   MRN: [id number]  This visit was conducted in person.  BP 128/80   Pulse 83   Temp 98 F (36.7 C) (Temporal)   Ht 5' 7"  (1.702 m)   Wt 217 lb 6.4 oz (98.6 kg)   SpO2 98%   BMI 34.05 kg/m    CC:  Chief Complaint  Patient presents with   Insect Bite    Tick bite 1 mo ago, sx of fatigue, HA, and body aches started 2 weeks ago    Subjective:   HPI: Aaron Malone is a 46 y.o. male presenting on 05/27/2022 for Insect Bite (Tick bite 1 mo ago, sx of fatigue, HA, and body aches started 2 weeks ago)  Patient reports new onset tick bite about 1 month ago.  Bite was on waist line... saw engorged tick.... no clear associated bite lesion or associated rash. Tick was attached for  unclear amount of time... but appera engorged and running around.  Feels it was deer tick. Now in the last 2 weeks he started noticing symptoms of fatigue, headache and body aches.  No fever.  No additional rash.  Neck stiffness.      No N/V/D.   Relevant past medical, surgical, family and social history reviewed and updated as indicated. Interim medical history since our last visit reviewed. Allergies and medications reviewed and updated. Outpatient Medications Prior to Visit  Medication Sig Dispense Refill   acetaminophen (TYLENOL) 500 MG tablet Take 500 mg by mouth every 6 (six) hours as needed.     cholecalciferol (VITAMIN D) 25 MCG (1000 UNIT) tablet Take 1,000 Units by mouth daily.     Continuous Blood Gluc Receiver (FREESTYLE LIBRE 2 READER) DEVI Use to check blood sugar continuous as instructed. 1 each 0   Continuous Blood Gluc Transmit (DEXCOM G6 TRANSMITTER) MISC Check blood sugar as recommended. 1 each 0   COVID-19 At Home Antigen Test (CARESTART COVID-19 HOME TEST) KIT Use as directed 2 kit 0   desvenlafaxine (PRISTIQ) 100 MG 24 hr tablet Take 1 tablet by mouth daily. 90 tablet 1   desvenlafaxine (PRISTIQ) 50 MG 24 hr tablet  Take 2 tablets (100 mg total) by mouth daily. (Patient not taking: Reported on 05/27/2022) 90 tablet 1   fexofenadine (ALLEGRA) 180 MG tablet Take 180 mg by mouth daily as needed for allergies.     fluticasone (FLONASE) 50 MCG/ACT nasal spray USE 2 SPRAYS IN EACH NOSTIL ONCE A DAY 48 g 3   glucose blood (FREESTYLE LITE) test strip Use as directed to check blood glucose. 100 strip 3   glucose monitoring kit (FREESTYLE) monitoring kit 1 each by Does not apply route as needed for other. 1 each 0   ibuprofen (ADVIL,MOTRIN) 600 MG tablet Take 400 mg by mouth 2 (two) times daily.     indapamide (LOZOL) 2.5 MG tablet Take 1 tablet (2.5 mg total) by mouth daily. 90 tablet 3   ipratropium (ATROVENT) 0.03 % nasal spray Place 2 sprays into both nostrils every 12 (twelve) hours as needed for rhinitis. (Patient not taking: Reported on 05/27/2022)     propranolol (INDERAL) 10 MG tablet TAKE 1 TABLET BY MOUTH ONCE DAILY AS NEEDED 90 tablet 1   pseudoephedrine (SUDAFED) 60 MG tablet Take 60 mg by mouth every 4 (four) hours as needed for congestion.     Semaglutide-Weight Management 2.4 MG/0.75ML SOAJ Inject 2.4 mg into the skin  once a week. 3 mL 11   tamsulosin (FLOMAX) 0.4 MG CAPS capsule Take 1 capsule (0.4 mg total) by mouth daily. 30 capsule 0   Turmeric 450 MG CAPS Take 450 mg by mouth daily.     No facility-administered medications prior to visit.     Per HPI unless specifically indicated in ROS section below Review of Systems  Constitutional:  Positive for fatigue. Negative for fever.  HENT:  Negative for ear pain.   Eyes:  Negative for pain.  Respiratory:  Negative for cough and shortness of breath.   Cardiovascular:  Negative for chest pain, palpitations and leg swelling.  Gastrointestinal:  Negative for abdominal pain.  Genitourinary:  Negative for dysuria.  Musculoskeletal:  Positive for myalgias and neck stiffness. Negative for arthralgias.  Neurological:  Negative for syncope,  light-headedness and headaches.  Psychiatric/Behavioral:  Negative for dysphoric mood.    Objective:  BP 128/80   Pulse 83   Temp 98 F (36.7 C) (Temporal)   Ht 5' 7"  (1.702 m)   Wt 217 lb 6.4 oz (98.6 kg)   SpO2 98%   BMI 34.05 kg/m   Wt Readings from Last 3 Encounters:  05/27/22 217 lb 6.4 oz (98.6 kg)  11/27/21 213 lb (96.6 kg)  11/12/21 221 lb 6.4 oz (100.4 kg)      Physical Exam Constitutional:      Appearance: He is well-developed.  HENT:     Head: Normocephalic.     Right Ear: Hearing normal.     Left Ear: Hearing normal.     Nose: Nose normal.  Neck:     Thyroid: No thyroid mass or thyromegaly.     Vascular: No carotid bruit.     Trachea: Trachea normal.  Cardiovascular:     Rate and Rhythm: Normal rate and regular rhythm.     Pulses: Normal pulses.     Heart sounds: Heart sounds not distant. No murmur heard.    No friction rub. No gallop.     Comments: No peripheral edema Pulmonary:     Effort: Pulmonary effort is normal. No respiratory distress.     Breath sounds: Normal breath sounds.  Skin:    General: Skin is warm and dry.     Findings: No rash.  Psychiatric:        Speech: Speech normal.        Behavior: Behavior normal.        Thought Content: Thought content normal.       Results for orders placed or performed in visit on 11/27/21  Microscopic Examination   Urine  Result Value Ref Range   WBC, UA 0-5 0 - 5 /hpf   RBC, Urine >30 (A) 0 - 2 /hpf   Epithelial Cells (non renal) 0-10 0 - 10 /hpf   Bacteria, UA Few None seen/Few  Urinalysis, Complete  Result Value Ref Range   Specific Gravity, UA 1.020 1.005 - 1.030   pH, UA 7.0 5.0 - 7.5   Color, UA Yellow Yellow   Appearance Ur Clear Clear   Leukocytes,UA Trace (A) Negative   Protein,UA Negative Negative/Trace   Glucose, UA Negative Negative   Ketones, UA Negative Negative   RBC, UA 3+ (A) Negative   Bilirubin, UA Negative Negative   Urobilinogen, Ur 0.2 0.2 - 1.0 mg/dL   Nitrite, UA  Negative Negative   Microscopic Examination See below:      COVID 19 screen:  No recent travel or known exposure to COVID19  The patient denies respiratory symptoms of COVID 19 at this time. The importance of social distancing was discussed today.   Assessment and Plan Problem List Items Addressed This Visit     Tick bite of pelvic region - Primary    Acute Given possible tick bite and correct time course for development of symptoms 2 weeks following the tick being noted we will evaluate with tickborne disease labs.  There is not enough pretest probability to treat empirically with doxycycline given no specific tick bite seen and its not 100% positive that the tick was attached although it was visualized as engorged.  Given return and ER precautions.  If symptoms progress we will go ahead and treat empirically for tickborne illness with Doxy.      Relevant Orders   B. burgdorfi antibodies by WB   Ehrlichia antibody panel   Rocky mtn spotted fvr abs pnl(IgG+IgM)       Eliezer Lofts, MD

## 2022-05-27 NOTE — Assessment & Plan Note (Signed)
Acute Given possible tick bite and correct time course for development of symptoms 2 weeks following the tick being noted we will evaluate with tickborne disease labs.  There is not enough pretest probability to treat empirically with doxycycline given no specific tick bite seen and its not 100% positive that the tick was attached although it was visualized as engorged.  Given return and ER precautions.  If symptoms progress we will go ahead and treat empirically for tickborne illness with Doxy.

## 2022-05-29 ENCOUNTER — Ambulatory Visit: Payer: 59 | Admitting: Physician Assistant

## 2022-05-30 LAB — B. BURGDORFI ANTIBODIES BY WB

## 2022-05-30 LAB — EHRLICHIA ANTIBODY PANEL
E. CHAFFEENSIS AB IGG: <1:64 {titer}
E. CHAFFEENSIS AB IGM: <1:20 {titer}

## 2022-05-30 LAB — ROCKY MTN SPOTTED FVR ABS PNL(IGG+IGM)
RMSF IgG: NOT DETECTED
RMSF IgM: NOT DETECTED

## 2022-06-02 DIAGNOSIS — G4733 Obstructive sleep apnea (adult) (pediatric): Secondary | ICD-10-CM | POA: Diagnosis not present

## 2022-06-03 ENCOUNTER — Ambulatory Visit: Payer: 59 | Admitting: Physician Assistant

## 2022-06-11 ENCOUNTER — Encounter: Payer: Self-pay | Admitting: Physician Assistant

## 2022-06-11 ENCOUNTER — Ambulatory Visit (INDEPENDENT_AMBULATORY_CARE_PROVIDER_SITE_OTHER): Payer: 59 | Admitting: Physician Assistant

## 2022-06-11 ENCOUNTER — Other Ambulatory Visit (HOSPITAL_COMMUNITY): Payer: Self-pay

## 2022-06-11 ENCOUNTER — Ambulatory Visit
Admission: RE | Admit: 2022-06-11 | Discharge: 2022-06-11 | Disposition: A | Discharge status: Home / Self Care | Payer: 59 | Attending: Urology | Admitting: Urology

## 2022-06-11 ENCOUNTER — Ambulatory Visit
Admission: RE | Admit: 2022-06-11 | Discharge: 2022-06-11 | Disposition: A | Discharge status: Home / Self Care | Payer: 59 | Source: Ambulatory Visit | Attending: Urology | Admitting: Urology

## 2022-06-11 ENCOUNTER — Other Ambulatory Visit: Payer: Self-pay

## 2022-06-11 VITALS — BP 120/82 | HR 81 | Ht 67.0 in | Wt 210.0 lb

## 2022-06-11 DIAGNOSIS — N200 Calculus of kidney: Secondary | ICD-10-CM | POA: Insufficient documentation

## 2022-06-11 MED ORDER — TAMSULOSIN HCL 0.4 MG PO CAPS
0.4000 mg | ORAL_CAPSULE | Freq: Every day | ORAL | 2 refills | Status: DC
  Filled 2022-06-11: qty 30, 30d supply, fill #0
  Filled 2022-07-09 – 2022-09-01 (×3): qty 30, 30d supply, fill #1

## 2022-06-11 NOTE — Progress Notes (Signed)
06/11/2022 3:07 PM   Aaron Malone Dec 19, 1975 [id number]  CC: Chief Complaint  Patient presents with   Nephrolithiasis   HPI: Aaron Malone is a 46 y.o. male with PMH recurrent stone disease with hypercalciuria on 2.5 mg indapamide daily who presents today for 52-monthstone follow-up.   Today he reports he spontaneously passed a stone around 6 months ago, notably he had some mild right flank discomfort and microscopic hematuria when he saw Dr. BErlene Quanaround that time.  He denies any further stone episodes since May.  He reports he has been making an effort to decrease his dietary oxalate intake since he was last seen.  He remains on indapamide.  He is also been taking litholyte water additives 1-2 times daily.  KUB today with stable appearing left upper and lower renal stones, right upper pole stone, with an apparent new approximate 4 mm right lower pole stone.  There is also a calcific density immediately medial to his right upper pole stone, however this is fainter in appearance and appears more consistent with bowel contents.  PMH: Past Medical History:  Diagnosis Date   Carpal tunnel syndrome    bilateral   COVID-19 08/2020   Gallstones 2012   History of kidney stones    Hyperlipidemia    Major depressive disorder, single episode, moderate ([facility])    Obesity    Sleep apnea    uses a CPAP    Surgical History: Past Surgical History:  Procedure Laterality Date   CARPAL TUNNEL RELEASE Bilateral 08/24/2014   Procedure: RIGHT LIMITED OPEN CARPAL TUNNEL RELEASE, LEFT CARPAL  TUNNEL INJECTION;  Surgeon: WRoseanne Kaufman MD;  Location[facility]  Service: Orthopedics;  Laterality: Bilateral;   CARPAL TUNNEL RELEASE Left 06/20/2015   Procedure: LEFT CARPAL TUNNEL RELEASE;  Surgeon: WRoseanne Kaufman MD;  Location[facility]  Service: Orthopedics;  Laterality: Left;   CHOLECYSTECTOMY  2012   COLONOSCOPY     CYSTOSCOPY W/ RETROGRADES Bilateral  12/16/2020   Procedure: CYSTOSCOPY WITH RETROGRADE PYELOGRAM;  Surgeon: BHollice Espy MD;  Location: [facility];  Service: Urology;  Laterality: Bilateral;   CYSTOSCOPY/URETEROSCOPY/HOLMIUM LASER/STENT PLACEMENT Right 12/16/2020   Procedure: CYSTOSCOPY/URETEROSCOPY/HOLMIUM LASER/STENT PLACEMENT;  Surgeon: BHollice Espy MD;  Location: [facility];  Service: Urology;  Laterality: Right;   EXTRACORPOREAL SHOCK WAVE LITHOTRIPSY Right 10/31/2020   Procedure: EXTRACORPOREAL SHOCK WAVE LITHOTRIPSY (ESWL);  Surgeon: BHollice Espy MD;  Location: [facility];  Service: Urology;  Laterality: Right;   INGUINAL HERNIA REPAIR Bilateral 1983   LITHOTRIPSY  12/2003, 09/2006   TRIGGER FINGER RELEASE     thumb    Home Medications:  Allergies as of 06/11/2022   No Known Allergies      Medication List        Accurate as of June 11, 2022  3:07 PM. If you have any questions, ask your nurse or doctor.          acetaminophen 500 MG tablet Commonly known as: TYLENOL Take 500 mg by mouth every 6 (six) hours as needed.   Carestart COVID-19 Home Test Kit Generic drug: COVID-19 At Home Antigen Test Use as directed   cholecalciferol 25 MCG (1000 UNIT) tablet Commonly known as: VITAMIN D3 Take 1,000 Units by mouth daily.   desvenlafaxine 50 MG 24 hr tablet Commonly known as: PRISTIQ Take 2 tablets (100 mg total) by mouth daily.   desvenlafaxine 100 MG 24 hr tablet Commonly known as: PRISTIQ Take 1 tablet by mouth daily.  Dexcom G6 Transmitter Misc Check blood sugar as recommended.   fexofenadine 180 MG tablet Commonly known as: ALLEGRA Take 180 mg by mouth daily as needed for allergies.   fluticasone 50 MCG/ACT nasal spray Commonly known as: FLONASE USE 2 SPRAYS IN EACH NOSTIL ONCE A DAY   FreeStyle Libre 2 Reader Devi Use to check blood sugar continuous as instructed.   FREESTYLE LITE test strip Generic drug: glucose blood Use as directed to check blood glucose.   glucose monitoring  kit monitoring kit 1 each by Does not apply route as needed for other.   ibuprofen 600 MG tablet Commonly known as: ADVIL Take 400 mg by mouth 2 (two) times daily.   indapamide 2.5 MG tablet Commonly known as: LOZOL Take 1 tablet (2.5 mg total) by mouth daily.   ipratropium 0.03 % nasal spray Commonly known as: ATROVENT Place 2 sprays into both nostrils every 12 (twelve) hours as needed for rhinitis.   propranolol 10 MG tablet Commonly known as: INDERAL TAKE 1 TABLET BY MOUTH ONCE DAILY AS NEEDED   pseudoephedrine 60 MG tablet Commonly known as: SUDAFED Take 60 mg by mouth every 4 (four) hours as needed for congestion.   tamsulosin 0.4 MG Caps capsule Commonly known as: FLOMAX Take 1 capsule (0.4 mg total) by mouth daily. Take as needed for stone episodes. What changed: additional instructions Changed by: Debroah Loop, PA-C   Turmeric 450 MG Caps Take 450 mg by mouth daily.   Wegovy 2.4 MG/0.75ML Soaj Generic drug: Semaglutide-Weight Management Inject 2.4 mg into the skin once a week.        Allergies:  No Known Allergies  Family History: Family History  Problem Relation Age of Onset   Hyperlipidemia Mother    Hypertension Mother    Irritable bowel syndrome Mother    Colon polyps Mother        x lots   Polycystic ovary syndrome Sister    Colon cancer Maternal Grandmother        dx in her 62's or 48's   Lung cancer Maternal Grandmother    Heart failure Maternal Grandfather    Heart disease Maternal Grandfather    Prostate cancer Maternal Grandfather    Multiple myeloma Paternal Grandfather    Diabetes Paternal Grandmother    Colon polyps Maternal Uncle     Social History:   reports that he has never smoked. He has never used smokeless tobacco. He reports current alcohol use. He reports that he does not use drugs.  Physical Exam: BP 120/82   Pulse 81   Ht 5' 7"  (1.702 m)   Wt 210 lb (95.3 kg)   BMI 32.89 kg/m   Constitutional:  Alert and  oriented, no acute distress, nontoxic appearing HEENT: [place], AT Cardiovascular: No clubbing, cyanosis, or edema Respiratory: Normal respiratory effort, no increased work of breathing Skin: No rashes, bruises or suspicious lesions Neurologic: Grossly intact, no focal deficits, moving all 4 extremities Psychiatric: Normal mood and affect  Pertinent Imaging: KUB, 06/11/2022: CLINICAL DATA:  Kidney stones.   EXAM: ABDOMEN - 1 VIEW   COMPARISON:  Radiograph 11/27/2021   FINDINGS: Two right intrarenal calculi seen by radiograph, 7 and 6 mm. 3 mm stone projects over the lower left kidney. Stable distribution of pelvic calcifications likely phleboliths. Right upper quadrant clips typical of cholecystectomy. Normal bowel gas pattern with small volume of colonic stool.   IMPRESSION: Bilateral intrarenal calculi, right greater than left.     Electronically Signed  By: Keith Rake M.D.   On: 06/13/2022 10:13  I personally reviewed the images referenced above and note a new right lower pole stone, stable right upper pole and left upper and lower pole stones, and a calcific density adjacent to the right upper pole stone likely represent containing bowel contents.  Assessment & Plan:   1. Nephrolithiasis Rather stable KUB compared to prior, though he does appear to have a new small right lower pole stone today.  We will continue to monitor with KUB in 1 year.  He is asymptomatic today.  We discussed prescribing Flomax for him to have on hand for when he does have an acute stone episode at home and he was in agreement with this plan. - tamsulosin (FLOMAX) 0.4 MG CAPS capsule; Take 1 capsule (0.4 mg total) by mouth daily. Take as needed for stone episodes.  Dispense: 30 capsule; Refill: 2  Return in about 1 year (around 06/12/2023) for Annual stone visit with KUB prior.  Debroah Loop, PA-C  [facility] [address] [place], [place]  [address] [phone]

## 2022-06-15 ENCOUNTER — Other Ambulatory Visit: Payer: Self-pay

## 2022-06-15 ENCOUNTER — Encounter: Payer: Self-pay | Admitting: *Deleted

## 2022-06-15 ENCOUNTER — Other Ambulatory Visit (HOSPITAL_COMMUNITY): Payer: Self-pay

## 2022-06-15 ENCOUNTER — Other Ambulatory Visit: Payer: Self-pay | Admitting: Family Medicine

## 2022-06-15 MED ORDER — DESVENLAFAXINE SUCCINATE ER 100 MG PO TB24
100.0000 mg | ORAL_TABLET | Freq: Every day | ORAL | 1 refills | Status: DC
  Filled 2022-06-15: qty 90, 90d supply, fill #0
  Filled 2022-09-12: qty 90, 90d supply, fill #1

## 2022-06-21 IMAGING — CR DG ABDOMEN 1V
1 series · 2 of 2 positions shown · non-contrast
Comparison: August 13, 2021

CLINICAL DATA: Intermittent mid abdominal pain x1 week.

EXAM:
ABDOMEN - 1 VIEW

[Series 1: dg abd 1 view · 0.14mm/px · 2 of 2 slices shown]
[im 1/2]
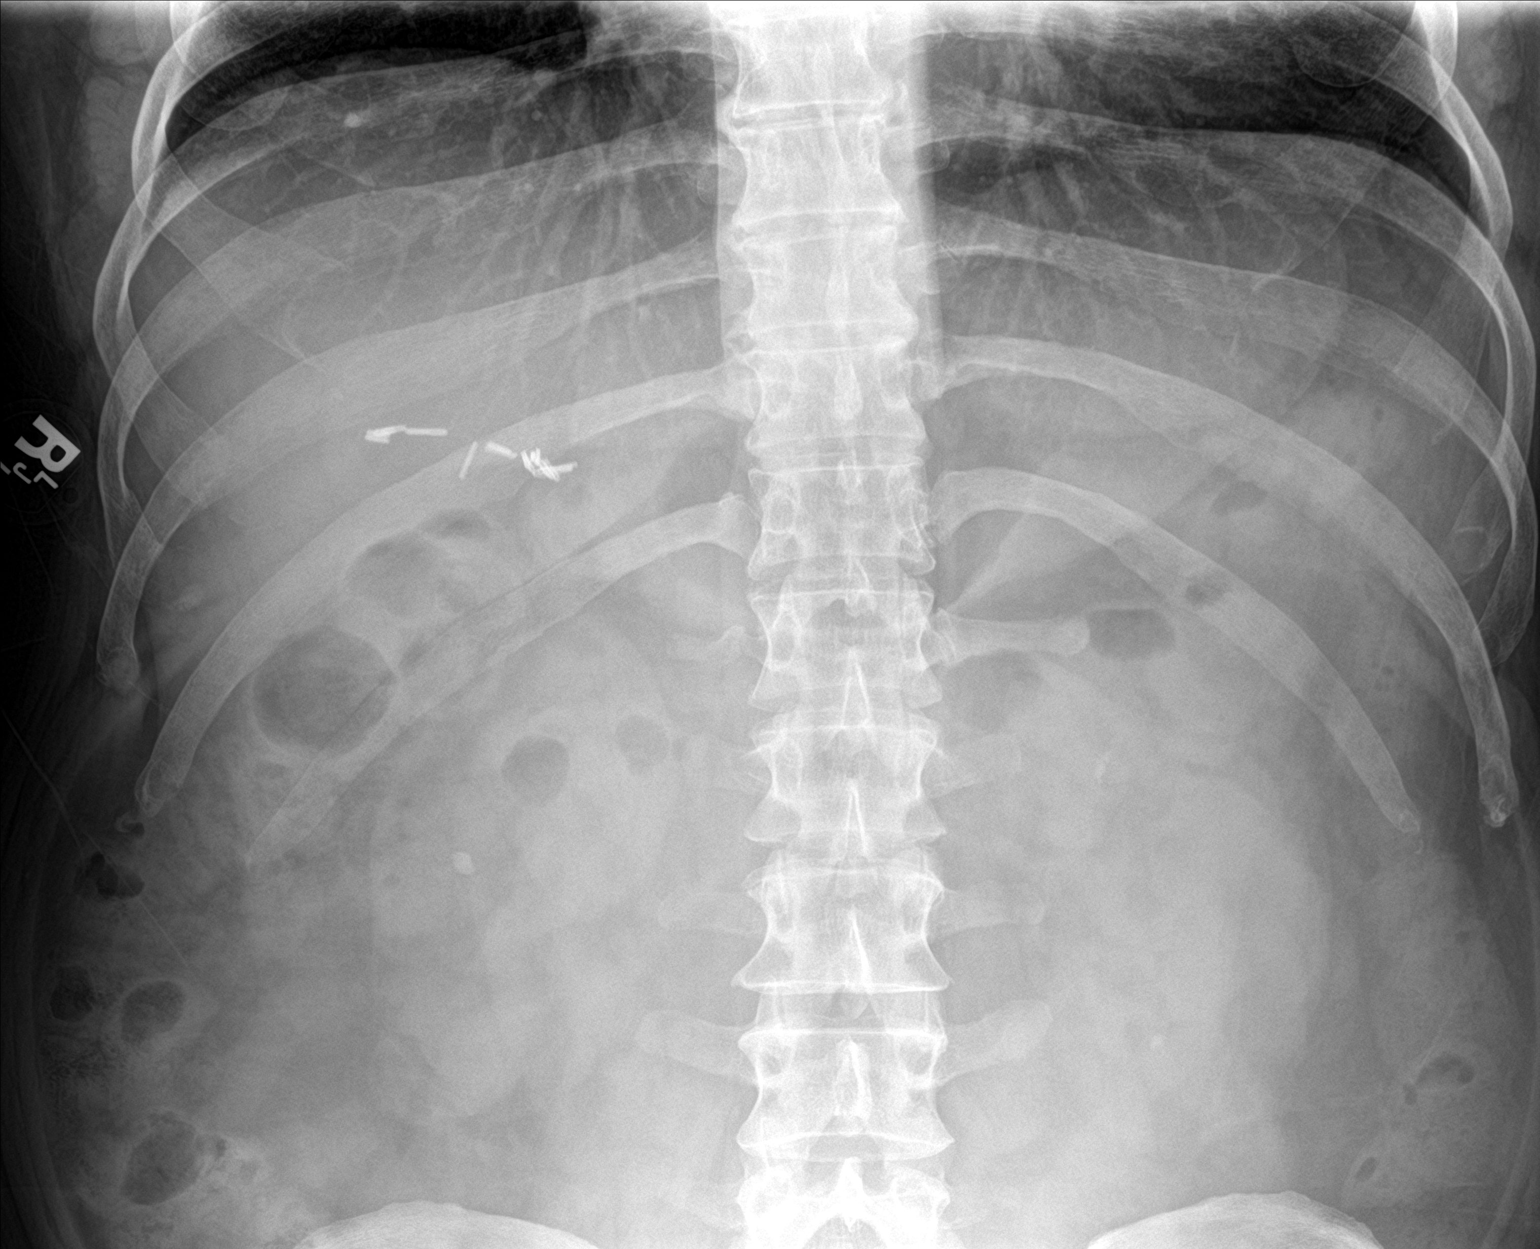
[im 2/2]
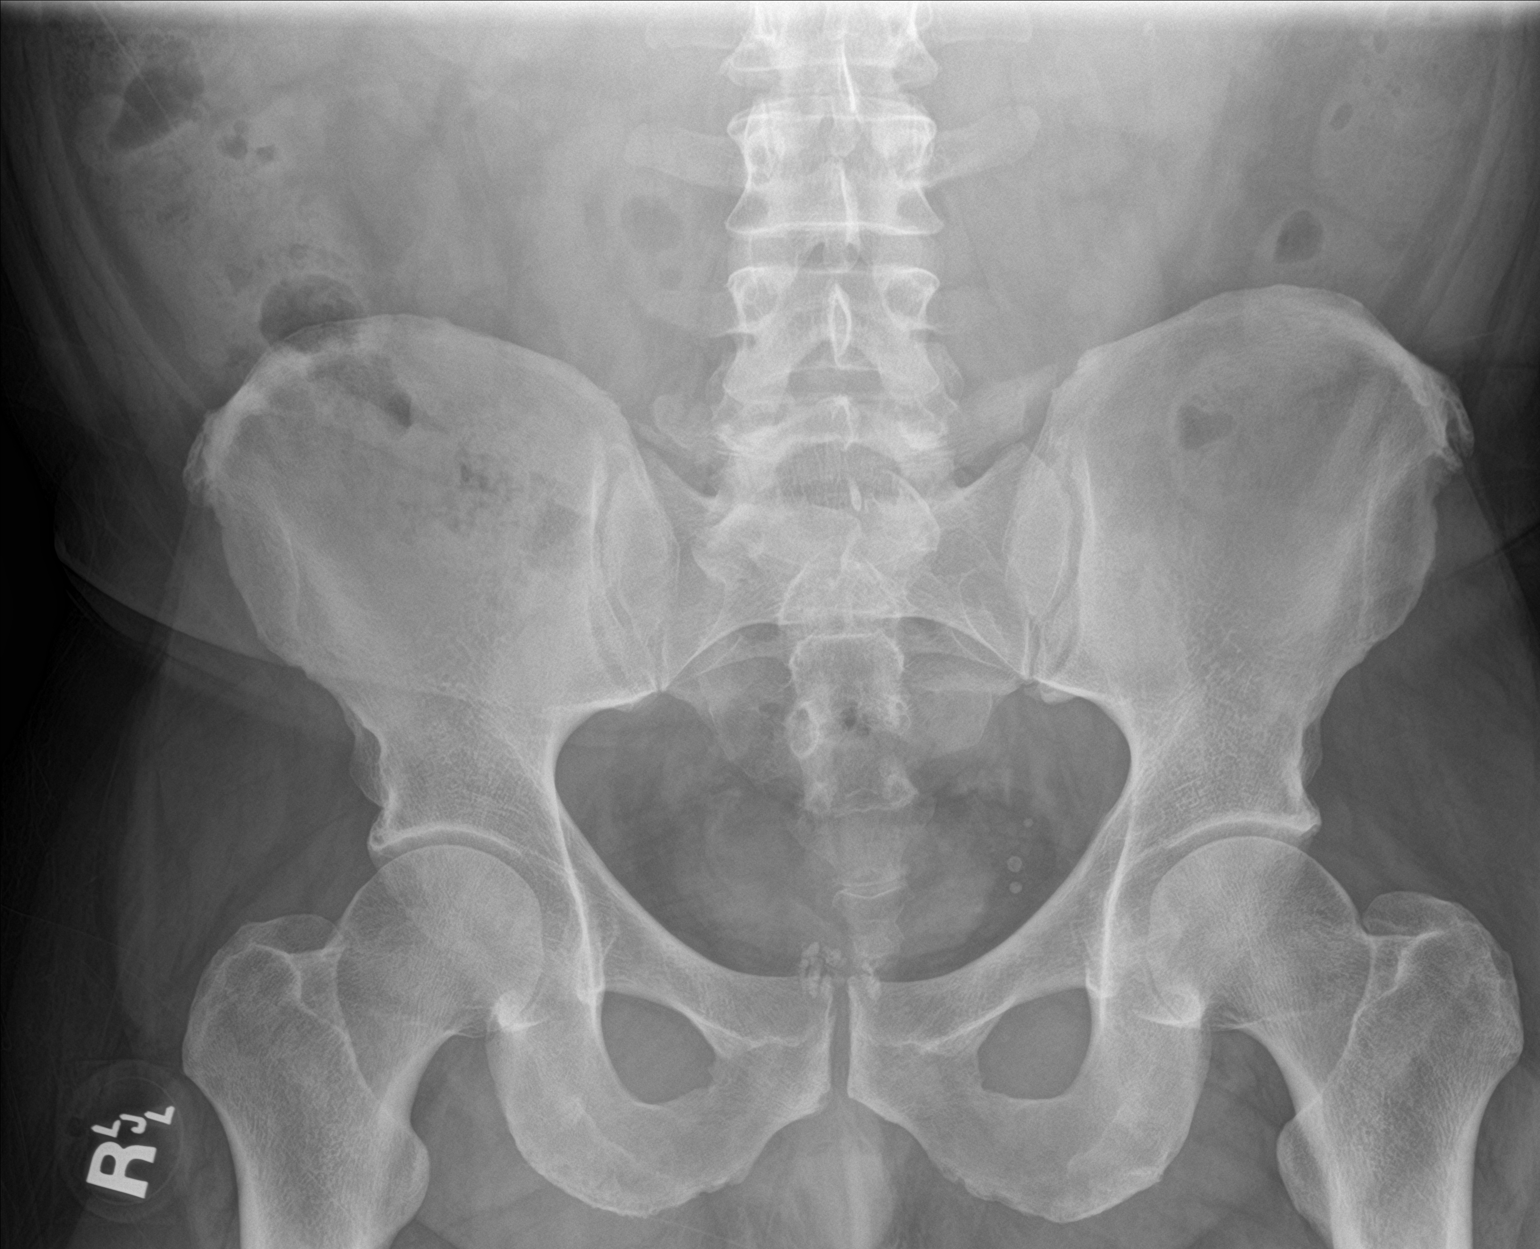

[2 of 2 positions shown; findings below may reference images not displayed]

FINDINGS: The bowel gas pattern is normal. A 6 mm soft tissue calcification is
seen projecting over the right kidney. A similar appearing 3 mm soft
tissue calcification is seen projecting over the lower pole of the
left kidney. These are seen on the prior study.
IMPRESSION: 1. Bilateral nephrolithiasis.
2. No evidence of bowel obstruction.

## 2022-07-10 ENCOUNTER — Other Ambulatory Visit (HOSPITAL_COMMUNITY): Payer: Self-pay

## 2022-07-13 ENCOUNTER — Other Ambulatory Visit: Payer: Self-pay

## 2022-07-13 ENCOUNTER — Other Ambulatory Visit (HOSPITAL_COMMUNITY): Payer: Self-pay

## 2022-07-14 ENCOUNTER — Other Ambulatory Visit (HOSPITAL_COMMUNITY): Payer: Self-pay

## 2022-07-15 ENCOUNTER — Other Ambulatory Visit (HOSPITAL_COMMUNITY): Payer: Self-pay

## 2022-07-18 ENCOUNTER — Other Ambulatory Visit (HOSPITAL_COMMUNITY): Payer: Self-pay

## 2022-07-20 ENCOUNTER — Other Ambulatory Visit: Payer: Self-pay

## 2022-07-26 ENCOUNTER — Other Ambulatory Visit (HOSPITAL_COMMUNITY): Payer: Self-pay

## 2022-07-26 ENCOUNTER — Encounter (HOSPITAL_COMMUNITY): Payer: Self-pay

## 2022-07-26 ENCOUNTER — Ambulatory Visit (HOSPITAL_COMMUNITY)
Admission: EM | Admit: 2022-07-26 | Discharge: 2022-07-26 | Disposition: A | Discharge status: Home / Self Care | Payer: 59 | Attending: Emergency Medicine | Admitting: Emergency Medicine

## 2022-07-26 DIAGNOSIS — S81011A Laceration without foreign body, right knee, initial encounter: Secondary | ICD-10-CM

## 2022-07-26 MED ORDER — CEPHALEXIN 500 MG PO CAPS
500.0000 mg | ORAL_CAPSULE | Freq: Two times a day (BID) | ORAL | 0 refills | Status: AC
  Filled 2022-07-26: qty 10, 5d supply, fill #0

## 2022-07-26 NOTE — Discharge Instructions (Signed)
Today your laceration has been cleaned with wound cleanser, 2 Dermabond clips applied to make the wound bed smaller, should heal itself with time without complication as you are a young healthy adult  Nonadherent dressing and Coban has been applied to at pressure to stop bleeding, leave in place for 48 hours then remove and check  If wound is still open you may reapply Dermabond clips given by our office and continue to monitor  You have been prophylactically sending Keflex, may take every morning and every evening for 5 days, may hold off on taking medicine unless she sees signs of infection such as increased swelling, increased pain, drainage, fever or chills  May Return to urgent care at any point for reevaluation if concerns regarding healing

## 2022-07-26 NOTE — ED Triage Notes (Signed)
Pt reports laceration to right knee after a skiing accident today. Last Tdap was 2018.

## 2022-07-26 NOTE — ED Provider Notes (Incomplete)
[facility]    CSN: [id number] Arrival date & time: 07/26/22  1644      History   Chief Complaint Chief Complaint  Patient presents with   Laceration    Laceration to inner knee lower thigh of left leg. Occurred in a snow ski fall. First aid applied by ski patrol. Bleeding under control but ski patrol recommended further evaluation for possible sutures. - Entered by patient    HPI Aaron Malone is a 46 y.o. male.   Patient presents with laceration to the right knee beginning this morning while skiing.  Was initially evaluated by ski patrol who completed a cleaning and covered with guidance.  Bleeding has subsided.  Was advised for follow-up for evaluation of sutures.  Last tetanus 2018.  Past Medical History:  Diagnosis Date   Carpal tunnel syndrome    bilateral   COVID-19 08/2020   Gallstones 2012   History of kidney stones    Hyperlipidemia    Major depressive disorder, single episode, moderate (HCC)    Obesity    Sleep apnea    uses a CPAP    Patient Active Problem List   Diagnosis Date Noted   Elevated transaminase level 04/29/2021   Fatty liver 04/29/2021   Obesity (BMI 30.0-34.9) 03/04/2021   Vitamin D deficiency 04/22/2020   Situational anxiety 07/31/2016   Nephrolithiasis 07/31/2016   Obstructive sleep apnea 07/29/2016   Tick bite of pelvic region 03/08/2012   Pulmonary granuloma (HCC) 11/08/2010   Major depressive disorder, recurrent, moderate (HCC) 06/13/2010   PURE HYPERCHOLESTEROLEMIA 05/30/2010   Seasonal and perennial allergic rhinitis 11/23/2008   NEPHROLITHIASIS, HX OF 06/15/2007    Past Surgical History:  Procedure Laterality Date   CARPAL TUNNEL RELEASE Bilateral 08/24/2014   Procedure: RIGHT LIMITED OPEN CARPAL TUNNEL RELEASE, LEFT CARPAL  TUNNEL INJECTION;  Surgeon: William Gramig, MD;  Location: [facility];  Service: Orthopedics;  Laterality: Bilateral;   CARPAL TUNNEL RELEASE Left 06/20/2015   Procedure: LEFT  CARPAL TUNNEL RELEASE;  Surgeon: William Gramig, MD;  Location: [facility];  Service: Orthopedics;  Laterality: Left;   CHOLECYSTECTOMY  2012   COLONOSCOPY     CYSTOSCOPY W/ RETROGRADES Bilateral 12/16/2020   Procedure: CYSTOSCOPY WITH RETROGRADE PYELOGRAM;  Surgeon: Brandon, Ashley, MD;  Location: [facility];  Service: Urology;  Laterality: Bilateral;   CYSTOSCOPY/URETEROSCOPY/HOLMIUM LASER/STENT PLACEMENT Right 12/16/2020   Procedure: CYSTOSCOPY/URETEROSCOPY/HOLMIUM LASER/STENT PLACEMENT;  Surgeon: Brandon, Ashley, MD;  Location: [facility];  Service: Urology;  Laterality: Right;   EXTRACORPOREAL SHOCK WAVE LITHOTRIPSY Right 10/31/2020   Procedure: EXTRACORPOREAL SHOCK WAVE LITHOTRIPSY (ESWL);  Surgeon: Brandon, Ashley, MD;  Location: [facility];  Service: Urology;  Laterality: Right;   INGUINAL HERNIA REPAIR Bilateral 1983   LITHOTRIPSY  12/2003, 09/2006   TRIGGER FINGER RELEASE     thumb       Home Medications    Prior to Admission medications   Medication Sig Start Date End Date Taking? Authorizing Provider  acetaminophen (TYLENOL) 500 MG tablet Take 500 mg by mouth every 6 (six) hours as needed.    [provider]  cholecalciferol (VITAMIN D) 25 MCG (1000 UNIT) tablet Take 1,000 Units by mouth daily.    [provider]  Continuous Blood Gluc Receiver (FREESTYLE LIBRE 2 READER) DEVI Use to check blood sugar continuous as instructed. 06/07/20   Bedsole, Amy E, MD  Continuous Blood Gluc Transmit (DEXCOM G6 TRANSMITTER) MISC Check blood sugar as recommended. 12/08/19   Bedsole, Amy E,   MD  COVID-19 At Home Antigen Test (CARESTART COVID-19 HOME TEST) KIT Use as directed 12/24/21   Nicks, Kristie J, RPH  desvenlafaxine (PRISTIQ) 100 MG 24 hr tablet Take 1 tablet (100 mg total) by mouth daily. 06/15/22   Bedsole, Amy E, MD  fexofenadine (ALLEGRA) 180 MG tablet Take 180 mg by mouth daily as needed for allergies. 09/29/21   [provider]  fluticasone (FLONASE) 50  MCG/ACT nasal spray USE 2 SPRAYS IN EACH NOSTIL ONCE A DAY 11/11/20 11/11/21  Bedsole, Amy E, MD  glucose blood (FREESTYLE LITE) test strip Use as directed to check blood glucose. 07/07/21   Bedsole, Amy E, MD  glucose monitoring kit (FREESTYLE) monitoring kit 1 each by Does not apply route as needed for other. 12/12/19   Bedsole, Amy E, MD  ibuprofen (ADVIL,MOTRIN) 600 MG tablet Take 400 mg by mouth 2 (two) times daily.    [provider]  indapamide (LOZOL) 2.5 MG tablet Take 1 tablet (2.5 mg total) by mouth daily. 04/21/22   Vaillancourt, Samantha, PA-C  ipratropium (ATROVENT) 0.03 % nasal spray Place 2 sprays into both nostrils every 12 (twelve) hours as needed for rhinitis.    [provider]  propranolol (INDERAL) 10 MG tablet TAKE 1 TABLET BY MOUTH ONCE DAILY AS NEEDED 03/31/22 03/31/23  Bedsole, Amy E, MD  pseudoephedrine (SUDAFED) 60 MG tablet Take 60 mg by mouth every 4 (four) hours as needed for congestion.    [provider]  Semaglutide-Weight Management 2.4 MG/0.75ML SOAJ Inject 2.4 mg into the skin once a week. 12/12/21   Bedsole, Amy E, MD  tamsulosin (FLOMAX) 0.4 MG CAPS capsule Take 1 capsule (0.4 mg total) by mouth daily. Take as needed for stone episodes. 06/11/22   Vaillancourt, Samantha, PA-C  Turmeric 450 MG CAPS Take 450 mg by mouth daily.    [provider]  thyroid (ARMOUR THYROID) 30 MG tablet ud 12/05/20 12/05/20      Family History Family History  Problem Relation Age of Onset   Hyperlipidemia Mother    Hypertension Mother    Irritable bowel syndrome Mother    Colon polyps Mother        x lots   Polycystic ovary syndrome Sister    Colon cancer Maternal Grandmother        dx in her 60's or 70's   Lung cancer Maternal Grandmother    Heart failure Maternal Grandfather    Heart disease Maternal Grandfather    Prostate cancer Maternal Grandfather    Multiple myeloma Paternal Grandfather    Diabetes Paternal Grandmother    Colon polyps  Maternal Uncle     Social History Social History   Tobacco Use   Smoking status: Never   Smokeless tobacco: Never  Vaping Use   Vaping Use: Never used  Substance Use Topics   Alcohol use: Yes    Comment: occasionally   Drug use: No     Allergies   Patient has no known allergies.   Review of Systems Review of Systems   Physical Exam Triage Vital Signs ED Triage Vitals  Enc Vitals Group     BP 07/26/22 1714 (!) 131/98     Pulse Rate 07/26/22 1714 (!) 101     Resp 07/26/22 1714 18     Temp 07/26/22 1716 98.1 F (36.7 C)     Temp Source 07/26/22 1716 Oral     SpO2 07/26/22 1714 97 %     Weight --        Height --      Head Circumference --      Peak Flow --      Pain Score --      Pain Loc --      Pain Edu? --      Excl. in GC? --    No data found.  Updated Vital Signs BP (!) 131/98 (BP Location: Left Arm)   Pulse (!) 101   Temp 98.1 F (36.7 C) (Oral)   Resp 18   SpO2 97%   Visual Acuity Right Eye Distance:   Left Eye Distance:   Bilateral Distance:    Right Eye Near:   Left Eye Near:    Bilateral Near:     Physical Exam Constitutional:      Appearance: Normal appearance.  HENT:     Head: Normocephalic.  Eyes:     Extraocular Movements: Extraocular movements intact.  Pulmonary:     Effort: Pulmonary effort is normal.  Skin:         Comments: 2 cm laceration present to the anterior right knee  Neurological:     Mental Status: He is alert and oriented to person, place, and time. Mental status is at baseline.      UC Treatments / Results  Labs (all labs ordered are listed, but only abnormal results are displayed) Labs Reviewed - No data to display  EKG   Radiology No results found.  Procedures Laceration Repair  Date/Time: 07/29/2022 8:09 AM  Performed by: White, Adrienne R, NP Authorized by: White, Adrienne R, NP   Consent:    Consent obtained:  Verbal   Consent given by:  Patient   Risks, benefits, and alternatives  were discussed: yes     Risks discussed:  Pain and infection   Alternatives discussed:  No treatment Universal protocol:    Procedure explained and questions answered to patient or proxy's satisfaction: yes     Patient identity confirmed:  Verbally with patient Anesthesia:    Anesthesia method:  None Laceration details:    Location:  Leg   Leg location:  R knee   Length (cm):  2 Exploration:    Limited defect created (wound extended): no     Wound exploration: entire depth of wound visualized   Treatment:    Area cleansed with:  Shur-Clens   Amount of cleaning:  Standard   Irrigation method:  Tap Skin repair:    Repair method: derma bond clips. Approximation:    Approximation:  Close (2 clips) Repair type:    Repair type:  Simple Post-procedure details:    Dressing:  Non-adherent dressing   Procedure completion:  Tolerated  (including critical care time)  Medications Ordered in UC Medications - No data to display  Initial Impression / Assessment and Plan / UC Course  I have reviewed the triage vital signs and the nursing notes.  Pertinent labs & imaging results that were available during my care of the patient were reviewed by me and considered in my medical decision making (see chart for details).  Right knee laceration, initial encounter  2  Dermabond clips applied to approximate wound, cleansed in office, recommended leaving in place for at least 48 hours before removal, Keflex prescribed prophylactically ,advised to watch for signs of infections and given strict precautions for when to follow-up for reevaluation may cleanse daily with diluted soapy water during normal hygiene, may follow-up with urgent care as needed for any concerns of failure Final Clinical Impressions(s) / UC   Diagnoses   Final diagnoses:  None   Discharge Instructions   None    ED Prescriptions   None    PDMP not reviewed this encounter.   White, Adrienne R, NP 07/29/22 0811     White, Adrienne R, NP 07/29/22 0811  

## 2022-07-27 ENCOUNTER — Other Ambulatory Visit (HOSPITAL_COMMUNITY): Payer: Self-pay

## 2022-08-11 DIAGNOSIS — M25552 Pain in left hip: Secondary | ICD-10-CM | POA: Diagnosis not present

## 2022-08-12 ENCOUNTER — Other Ambulatory Visit (HOSPITAL_COMMUNITY): Payer: Self-pay

## 2022-08-12 MED ORDER — METHOCARBAMOL 500 MG PO TABS
ORAL_TABLET | ORAL | 0 refills | Status: AC
  Filled 2022-08-12: qty 20, 5d supply, fill #0

## 2022-08-12 MED ORDER — PREDNISONE 10 MG (21) PO TBPK
ORAL_TABLET | ORAL | 0 refills | Status: DC
  Filled 2022-08-12: qty 21, 6d supply, fill #0

## 2022-08-28 DIAGNOSIS — M25552 Pain in left hip: Secondary | ICD-10-CM | POA: Diagnosis not present

## 2022-08-31 DIAGNOSIS — G4733 Obstructive sleep apnea (adult) (pediatric): Secondary | ICD-10-CM | POA: Diagnosis not present

## 2022-09-01 ENCOUNTER — Other Ambulatory Visit: Payer: Self-pay | Admitting: *Deleted

## 2022-09-01 ENCOUNTER — Ambulatory Visit: Payer: 59 | Admitting: Physician Assistant

## 2022-09-01 ENCOUNTER — Other Ambulatory Visit: Payer: Self-pay

## 2022-09-01 ENCOUNTER — Ambulatory Visit
Admission: RE | Admit: 2022-09-01 | Discharge: 2022-09-01 | Disposition: A | Discharge status: Home / Self Care | Payer: 59 | Attending: Physician Assistant | Admitting: Physician Assistant

## 2022-09-01 ENCOUNTER — Ambulatory Visit
Admission: RE | Admit: 2022-09-01 | Discharge: 2022-09-01 | Disposition: A | Discharge status: Home / Self Care | Payer: 59 | Source: Ambulatory Visit | Attending: Physician Assistant | Admitting: Physician Assistant

## 2022-09-01 ENCOUNTER — Encounter: Payer: Self-pay | Admitting: Physician Assistant

## 2022-09-01 VITALS — BP 131/88 | HR 75 | Ht 67.0 in | Wt 203.0 lb

## 2022-09-01 DIAGNOSIS — N200 Calculus of kidney: Secondary | ICD-10-CM | POA: Insufficient documentation

## 2022-09-01 DIAGNOSIS — M25552 Pain in left hip: Secondary | ICD-10-CM | POA: Diagnosis not present

## 2022-09-01 DIAGNOSIS — N201 Calculus of ureter: Secondary | ICD-10-CM

## 2022-09-01 DIAGNOSIS — Z9049 Acquired absence of other specified parts of digestive tract: Secondary | ICD-10-CM | POA: Diagnosis not present

## 2022-09-01 LAB — URINALYSIS, COMPLETE
Bilirubin, UA: NEGATIVE
Glucose, UA: NEGATIVE
Ketones, UA: NEGATIVE
Nitrite, UA: NEGATIVE
Protein,UA: NEGATIVE
Specific Gravity, UA: 1.015 (ref 1.005–1.030)
Urobilinogen, Ur: 0.2 mg/dL (ref 0.2–1.0)
pH, UA: 7.5 (ref 5.0–7.5)

## 2022-09-01 LAB — MICROSCOPIC EXAMINATION: RBC, Urine: >30 /hpf — AB (ref 0–2)

## 2022-09-01 NOTE — Progress Notes (Signed)
09/01/2022 3:05 PM   Aaron Malone 18-Mar-1976 [id number]  CC: Chief Complaint  Patient presents with   Nephrolithiasis   HPI: Aaron Malone is a 47 y.o. male with PMH recurrent stone disease with hypercalciuria on 2.5 mg indapamide daily who presents today for evaluation of an acute stone episode.   Today he reports an approximate 1 week history of right flank pain and intermittent gross hematuria.  Today he has been having penile discomfort.  He passed a large stone in the waiting room today when giving a urine sample.  KUB today with interval resolution of his right lower pole stone.  In-office UA today positive for 2+ blood and 1+ leukocytes; urine microscopy with 6-10 WBCs/HPF, >30 RBCs/HPF, amorphous sediment, and moderate bacteria.   PMH: Past Medical History:  Diagnosis Date   Carpal tunnel syndrome    bilateral   COVID-19 08/2020   Gallstones 2012   History of kidney stones    Hyperlipidemia    Major depressive disorder, single episode, moderate (HCC)    Obesity    Sleep apnea    uses a CPAP    Surgical History: Past Surgical History:  Procedure Laterality Date   CARPAL TUNNEL RELEASE Bilateral 08/24/2014   Procedure: RIGHT LIMITED OPEN CARPAL TUNNEL RELEASE, LEFT CARPAL  TUNNEL INJECTION;  Surgeon: Roseanne Kaufman, MD;  Location: [place];  Service: Orthopedics;  Laterality: Bilateral;   CARPAL TUNNEL RELEASE Left 06/20/2015   Procedure: LEFT CARPAL TUNNEL RELEASE;  Surgeon: Roseanne Kaufman, MD;  Location: [place];  Service: Orthopedics;  Laterality: Left;   CHOLECYSTECTOMY  2012   COLONOSCOPY     CYSTOSCOPY W/ RETROGRADES Bilateral 12/16/2020   Procedure: CYSTOSCOPY WITH RETROGRADE PYELOGRAM;  Surgeon: Hollice Espy, MD;  Location: [facility];  Service: Urology;  Laterality: Bilateral;   CYSTOSCOPY/URETEROSCOPY/HOLMIUM LASER/STENT PLACEMENT Right 12/16/2020   Procedure: CYSTOSCOPY/URETEROSCOPY/HOLMIUM LASER/STENT PLACEMENT;   Surgeon: Hollice Espy, MD;  Location: [facility];  Service: Urology;  Laterality: Right;   EXTRACORPOREAL SHOCK WAVE LITHOTRIPSY Right 10/31/2020   Procedure: EXTRACORPOREAL SHOCK WAVE LITHOTRIPSY (ESWL);  Surgeon: Hollice Espy, MD;  Location: [facility];  Service: Urology;  Laterality: Right;   INGUINAL HERNIA REPAIR Bilateral 1983   LITHOTRIPSY  12/2003, 09/2006   TRIGGER FINGER RELEASE     thumb    Home Medications:  Allergies as of 09/01/2022   No Known Allergies      Medication List        Accurate as of September 01, 2022  3:05 PM. If you have any questions, ask your nurse or doctor.          acetaminophen 500 MG tablet Commonly known as: TYLENOL Take 500 mg by mouth every 6 (six) hours as needed.   Carestart COVID-19 Home Test Kit Generic drug: COVID-19 At Home Antigen Test Use as directed   cholecalciferol 25 MCG (1000 UNIT) tablet Commonly known as: VITAMIN D3 Take 1,000 Units by mouth daily.   desvenlafaxine 100 MG 24 hr tablet Commonly known as: PRISTIQ Take 1 tablet (100 mg total) by mouth daily.   Dexcom G6 Transmitter Misc Check blood sugar as recommended.   fexofenadine 180 MG tablet Commonly known as: ALLEGRA Take 180 mg by mouth daily as needed for allergies.   fluticasone 50 MCG/ACT nasal spray Commonly known as: FLONASE USE 2 SPRAYS IN EACH NOSTIL ONCE A DAY   FreeStyle Libre 2 Reader Devi Use to check blood sugar continuous as instructed.   FREESTYLE LITE test  strip Generic drug: glucose blood Use as directed to check blood glucose.   glucose monitoring kit monitoring kit 1 each by Does not apply route as needed for other.   ibuprofen 600 MG tablet Commonly known as: ADVIL Take 400 mg by mouth 2 (two) times daily.   indapamide 2.5 MG tablet Commonly known as: LOZOL Take 1 tablet (2.5 mg total) by mouth daily.   ipratropium 0.03 % nasal spray Commonly known as: ATROVENT Place 2 sprays into both nostrils every 12 (twelve) hours as  needed for rhinitis.   methocarbamol 500 MG tablet Commonly known as: ROBAXIN Take 1 tablet by mouth 4 times a day for 5 days.   predniSONE 10 MG (21) Tbpk tablet Commonly known as: STERAPRED UNI-PAK 21 TAB Take as directed   propranolol 10 MG tablet Commonly known as: INDERAL TAKE 1 TABLET BY MOUTH ONCE DAILY AS NEEDED   pseudoephedrine 60 MG tablet Commonly known as: SUDAFED Take 60 mg by mouth every 4 (four) hours as needed for congestion.   tamsulosin 0.4 MG Caps capsule Commonly known as: FLOMAX Take 1 capsule (0.4 mg total) by mouth daily. Take as needed for stone episodes.   Turmeric 450 MG Caps Take 450 mg by mouth daily.   Wegovy 2.4 MG/0.75ML Soaj Generic drug: Semaglutide-Weight Management Inject 2.4 mg into the skin once a week.        Allergies:  No Known Allergies  Family History: Family History  Problem Relation Age of Onset   Hyperlipidemia Mother    Hypertension Mother    Irritable bowel syndrome Mother    Colon polyps Mother        x lots   Polycystic ovary syndrome Sister    Colon cancer Maternal Grandmother        dx in her 68's or 64's   Lung cancer Maternal Grandmother    Heart failure Maternal Grandfather    Heart disease Maternal Grandfather    Prostate cancer Maternal Grandfather    Multiple myeloma Paternal Grandfather    Diabetes Paternal Grandmother    Colon polyps Maternal Uncle     Social History:   reports that he has never smoked. He has never used smokeless tobacco. He reports current alcohol use. He reports that he does not use drugs.  Physical Exam: BP 131/88   Pulse 75   Ht '5\' 7"'$  (1.702 m)   Wt 203 lb (92.1 kg)   BMI 31.79 kg/m   Constitutional:  Alert and oriented, no acute distress, nontoxic appearing HEENT: [place], AT Cardiovascular: No clubbing, cyanosis, or edema Respiratory: Normal respiratory effort, no increased work of breathing Skin: No rashes, bruises or suspicious lesions Neurologic: Grossly intact, no  focal deficits, moving all 4 extremities Psychiatric: Normal mood and affect  Laboratory Data: Results for orders placed or performed in visit on 09/01/22  Microscopic Examination   Urine  Result Value Ref Range   WBC, UA 6-10 (A) 0 - 5 /hpf   RBC, Urine >30 (A) 0 - 2 /hpf   Epithelial Cells (non renal) 0-10 0 - 10 /hpf   Crystals Present (A) N/A   Crystal Type Amorphous Sediment N/A   Mucus, UA Present (A) Not Estab.   Bacteria, UA Moderate (A) None seen/Few  Urinalysis, Complete  Result Value Ref Range   Specific Gravity, UA 1.015 1.005 - 1.030   pH, UA 7.5 5.0 - 7.5   Color, UA Yellow Yellow   Appearance Ur Hazy (A) Clear   Leukocytes,UA 1+ (  A) Negative   Protein,UA Negative Negative/Trace   Glucose, UA Negative Negative   Ketones, UA Negative Negative   RBC, UA 2+ (A) Negative   Bilirubin, UA Negative Negative   Urobilinogen, Ur 0.2 0.2 - 1.0 mg/dL   Nitrite, UA Negative Negative   Microscopic Examination See below:    Pertinent Imaging: CLINICAL DATA:  Nephrolithiasis   EXAM: ABDOMEN - 1 VIEW   COMPARISON:  06/11/2022   FINDINGS: The bowel gas pattern is normal. 8 mm stone overlies the right kidney. 3 mm stone overlies bodies left kidney lower pole. 7 mm stone overlies the left kidney upper pole. There are cholecystectomy clips.   IMPRESSION: Bilateral nephrolithiasis.     Electronically Signed   By: Sammie Bench M.D.   On: 09/03/2022 00:18  I personally reviewed the images referenced above and note interval resolution of the right lower pole stone.  Assessment & Plan:   1. Right ureteral stone Spontaneously passed a large stone in our reading room today.  Will send stone for analysis.  Interval clearance of his right lower pole stone on KUB today.  UA today consistent with acute stone episode, anticipate this will resolve on its own.  We discussed returning to clinic if his symptoms do not resolve consistent with retained fragment.  He expressed  understanding. - Urinalysis, Complete - Calculi, with Photograph (to Clinical Lab)  Return if symptoms worsen or fail to improve.  Debroah Loop, PA-C  [facility] [address], [place], [place] [address] [phone]

## 2022-09-04 DIAGNOSIS — M25552 Pain in left hip: Secondary | ICD-10-CM | POA: Diagnosis not present

## 2022-09-08 ENCOUNTER — Other Ambulatory Visit: Payer: Self-pay

## 2022-09-08 DIAGNOSIS — M25552 Pain in left hip: Secondary | ICD-10-CM | POA: Diagnosis not present

## 2022-09-09 ENCOUNTER — Other Ambulatory Visit: Payer: Self-pay

## 2022-09-10 ENCOUNTER — Other Ambulatory Visit (HOSPITAL_COMMUNITY): Payer: Self-pay

## 2022-09-11 DIAGNOSIS — M25552 Pain in left hip: Secondary | ICD-10-CM | POA: Diagnosis not present

## 2022-09-14 ENCOUNTER — Other Ambulatory Visit: Payer: Self-pay | Admitting: Physician Assistant

## 2022-09-14 ENCOUNTER — Other Ambulatory Visit: Payer: Self-pay

## 2022-09-14 ENCOUNTER — Other Ambulatory Visit (HOSPITAL_COMMUNITY): Payer: Self-pay

## 2022-09-14 DIAGNOSIS — N200 Calculus of kidney: Secondary | ICD-10-CM

## 2022-09-14 MED ORDER — OXYCODONE-ACETAMINOPHEN 5-325 MG PO TABS
1.0000 | ORAL_TABLET | Freq: Four times a day (QID) | ORAL | 0 refills | Status: AC | PRN
  Filled 2022-09-14: qty 6, 1d supply, fill #0

## 2022-09-15 ENCOUNTER — Telehealth: Payer: Self-pay

## 2022-09-15 DIAGNOSIS — M25552 Pain in left hip: Secondary | ICD-10-CM | POA: Diagnosis not present

## 2022-09-15 LAB — CALCULI, WITH PHOTOGRAPH (CLINICAL LAB)
CaHPO4 (Brushite): 30 %
Hydroxyapatite: 70 %
Weight Calculi: 117 mg

## 2022-09-16 ENCOUNTER — Other Ambulatory Visit: Payer: Self-pay

## 2022-09-17 DIAGNOSIS — M25552 Pain in left hip: Secondary | ICD-10-CM | POA: Diagnosis not present

## 2022-09-22 DIAGNOSIS — M25552 Pain in left hip: Secondary | ICD-10-CM | POA: Diagnosis not present

## 2022-10-08 ENCOUNTER — Telehealth: Payer: Self-pay | Admitting: Family Medicine

## 2022-10-08 DIAGNOSIS — Z125 Encounter for screening for malignant neoplasm of prostate: Secondary | ICD-10-CM

## 2022-10-08 DIAGNOSIS — E559 Vitamin D deficiency, unspecified: Secondary | ICD-10-CM

## 2022-10-08 DIAGNOSIS — E7800 Pure hypercholesterolemia, unspecified: Secondary | ICD-10-CM

## 2022-10-08 NOTE — Telephone Encounter (Signed)
-----   Message from Velna Hatchet, RT sent at 09/22/2022  9:01 AM EST ----- Regarding: Fri 2/23 lab Patient is scheduled for cpx, please order future labs.  Thanks, Anda Kraft

## 2022-10-09 ENCOUNTER — Other Ambulatory Visit (INDEPENDENT_AMBULATORY_CARE_PROVIDER_SITE_OTHER): Payer: 59

## 2022-10-09 DIAGNOSIS — E7800 Pure hypercholesterolemia, unspecified: Secondary | ICD-10-CM

## 2022-10-09 DIAGNOSIS — Z125 Encounter for screening for malignant neoplasm of prostate: Secondary | ICD-10-CM

## 2022-10-09 DIAGNOSIS — E559 Vitamin D deficiency, unspecified: Secondary | ICD-10-CM | POA: Diagnosis not present

## 2022-10-09 LAB — COMPREHENSIVE METABOLIC PANEL
ALT: 27 U/L (ref 0–53)
AST: 17 U/L (ref 0–37)
Albumin: 4.5 g/dL (ref 3.5–5.2)
Alkaline Phosphatase: 50 U/L (ref 39–117)
BUN: 17 mg/dL (ref 6–23)
CO2: 35 mEq/L — ABNORMAL HIGH (ref 19–32)
Calcium: 9.7 mg/dL (ref 8.4–10.5)
Chloride: 98 mEq/L (ref 96–112)
Creatinine, Ser: 0.84 mg/dL (ref 0.40–1.50)
GFR: 106.16 mL/min (ref >60.00)
Glucose, Bld: 86 mg/dL (ref 70–99)
Potassium: 3.9 mEq/L (ref 3.5–5.1)
Sodium: 142 mEq/L (ref 135–145)
Total Bilirubin: 0.5 mg/dL (ref 0.2–1.2)
Total Protein: 6.9 g/dL (ref 6.0–8.3)

## 2022-10-09 LAB — LIPID PANEL
Cholesterol: 244 mg/dL — ABNORMAL HIGH (ref 0–200)
HDL: 53.8 mg/dL (ref >39.00)
LDL Cholesterol: 172 mg/dL — ABNORMAL HIGH (ref 0–99)
NonHDL: 190.5
Total CHOL/HDL Ratio: 5
Triglycerides: 94 mg/dL (ref 0.0–149.0)
VLDL: 18.8 mg/dL (ref 0.0–40.0)

## 2022-10-09 LAB — VITAMIN D 25 HYDROXY (VIT D DEFICIENCY, FRACTURES): VITD: 14.59 ng/mL — ABNORMAL LOW (ref 30.00–100.00)

## 2022-10-09 LAB — PSA: PSA: 0.48 ng/mL (ref 0.10–4.00)

## 2022-10-09 NOTE — Progress Notes (Signed)
No critical labs need to be addressed urgently. We will discuss labs in detail at upcoming office visit.   

## 2022-10-12 ENCOUNTER — Other Ambulatory Visit: Payer: Self-pay | Admitting: Family Medicine

## 2022-10-12 ENCOUNTER — Other Ambulatory Visit: Payer: Self-pay

## 2022-10-12 MED FILL — Semaglutide (Weight Mngmt) Soln Auto-Injector 2.4 MG/0.75ML: SUBCUTANEOUS | Qty: 3 | Fill #0 | Status: CN

## 2022-10-12 NOTE — Telephone Encounter (Signed)
Patient has CPE scheduled on Tuesday 10/13/22.  Please refill after appointment if appropriate.

## 2022-10-12 NOTE — Telephone Encounter (Signed)
Patient is scheduled for CPE on Tuesday 10/13/22.  Please refill after visit if appropriate.

## 2022-10-13 ENCOUNTER — Encounter: Payer: Self-pay | Admitting: Family Medicine

## 2022-10-13 ENCOUNTER — Ambulatory Visit (INDEPENDENT_AMBULATORY_CARE_PROVIDER_SITE_OTHER): Payer: 59 | Admitting: Family Medicine

## 2022-10-13 ENCOUNTER — Encounter: Payer: Self-pay | Admitting: *Deleted

## 2022-10-13 ENCOUNTER — Other Ambulatory Visit: Payer: Self-pay

## 2022-10-13 VITALS — BP 90/70 | HR 88 | Temp 97.8°F | Ht 66.25 in | Wt 205.2 lb

## 2022-10-13 DIAGNOSIS — G4733 Obstructive sleep apnea (adult) (pediatric): Secondary | ICD-10-CM

## 2022-10-13 DIAGNOSIS — Z0000 Encounter for general adult medical examination without abnormal findings: Secondary | ICD-10-CM

## 2022-10-13 DIAGNOSIS — E559 Vitamin D deficiency, unspecified: Secondary | ICD-10-CM

## 2022-10-13 DIAGNOSIS — E669 Obesity, unspecified: Secondary | ICD-10-CM

## 2022-10-13 DIAGNOSIS — J8410 Pulmonary fibrosis, unspecified: Secondary | ICD-10-CM | POA: Diagnosis not present

## 2022-10-13 DIAGNOSIS — E7800 Pure hypercholesterolemia, unspecified: Secondary | ICD-10-CM | POA: Diagnosis not present

## 2022-10-13 DIAGNOSIS — F331 Major depressive disorder, recurrent, moderate: Secondary | ICD-10-CM

## 2022-10-13 MED ORDER — WEGOVY 2.4 MG/0.75ML ~~LOC~~ SOAJ
2.4000 mg | SUBCUTANEOUS | 3 refills | Status: DC
  Filled 2022-10-13 (×2): qty 6, 56d supply, fill #0
  Filled 2022-10-28: qty 3, 28d supply, fill #0
  Filled 2022-12-01: qty 3, 28d supply, fill #1
  Filled 2022-12-29 – 2023-01-06 (×2): qty 3, 28d supply, fill #2

## 2022-10-13 MED FILL — Semaglutide (Weight Mngmt) Soln Auto-Injector 2.4 MG/0.75ML: SUBCUTANEOUS | Qty: 3 | Fill #0 | Status: CN

## 2022-10-13 NOTE — Patient Instructions (Addendum)
Increase vit D dose to 2000-5000 mg daily.  We will set up calcium CT score test.  Keep working on healthy eating and regular exercise.

## 2022-10-13 NOTE — Assessment & Plan Note (Addendum)
On CPAP.Marland Kitchen  Has lost weight but has no issue with continuing CPAP.  Feels as though it fits well and works well for him.

## 2022-10-13 NOTE — Assessment & Plan Note (Signed)
No further eval needed.

## 2022-10-13 NOTE — Assessment & Plan Note (Signed)
Low 10-year CVD risk score, but significant family history and worsening cholesterol.  We will proceed with a calcium CT score to estimate risk and possible need for statin initiation.  He will continue working on low-cholesterol heart healthy diet.

## 2022-10-13 NOTE — Assessment & Plan Note (Addendum)
Replete with Vit D OTC.  Feels high-dose vitamin D at 50,000 units weekly was possibly associated with his kidney stones so he would like to avoid this.  Will reevaluate him in 6 months

## 2022-10-13 NOTE — Assessment & Plan Note (Addendum)
Stable, chronic.  Continue current medication..  Was unable to wean without irritability recurring.   Pristiq 100 mg XL daily.

## 2022-10-13 NOTE — Assessment & Plan Note (Signed)
Continue to work on [employer] management on semaglutide 2.4 mg weekly.

## 2022-10-13 NOTE — Progress Notes (Signed)
Patient ID: Aaron Malone, male    DOB: [birth year], 47 y.o.   MRN: [id number]  This visit was conducted in person.  BP 90/70   Pulse 88   Temp 97.8 F (36.6 C) (Temporal)   Ht 5' 6.25" (1.683 m)   Wt 205 lb 4 oz (93.1 kg)   SpO2 97%   BMI 32.88 kg/m    CC:  Chief Complaint  Patient presents with   Annual Exam    Subjective:   HPI: Aaron Malone is a 47 y.o. male presenting on 10/13/2022 for Annual Exam  Elevated Cholesterol: Cholesterol not at goal but low 10-year ASCVD risk score Lab Results  Component Value Date   CHOL 244 (H) 10/09/2022   HDL 53.80 10/09/2022   LDLCALC 172 (H) 10/09/2022   LDLDIRECT 147.8 05/03/2013   TRIG 94.0 10/09/2022   CHOLHDL 5 10/09/2022  Using medications without problems: Muscle aches:  Diet compliance: moderate Exercise: minimal Other complaints: The 10-year ASCVD risk score (Arnett DK, et al., 2019) is: 1.3%   Values used to calculate the score:     Age: 38 years     Sex: Male     Is Non-Hispanic African American: No     Diabetic: No     Tobacco smoker: No     Systolic Blood Pressure: 90 mmHg     Is BP treated: No     HDL Cholesterol: 53.8 mg/dL     Total Cholesterol: 244 mg/dL  He is interested in calcium CT score.  Family history CAD  in MGF.  High cholesterol in MOM, Dad.  Situation anxiety/MDD: well  controlled on Pristiq 100 mg daily.. was not able to wean off. [place] Office Visit from 10/13/2022 in [place] at [facility]  PHQ-2 Total Score 1      Vit D again low despite 1000 IU daily   OSA: CPAP followed by pulmonary.  Weight management currently on semaglutide 2.4 mg weekly... no longer will be covered.  He has been eating smaller meals, less snacking, healthy choices. He has seen about 40 LBs over last 2 years.  No SE.  Wt Readings from Last 3 Encounters:  10/13/22 205 lb 4 oz (93.1 kg)  09/01/22 203 lb (92.1 kg)  06/11/22 210 lb (95.3 kg)        Relevant past  medical, surgical, family and social history reviewed and updated as indicated. Interim medical history since our last visit reviewed. Allergies and medications reviewed and updated. Outpatient Medications Prior to Visit  Medication Sig Dispense Refill   acetaminophen (TYLENOL) 500 MG tablet Take 500 mg by mouth every 6 (six) hours as needed.     cholecalciferol (VITAMIN D) 25 MCG (1000 UNIT) tablet Take 2,000 Units by mouth daily.     Continuous Blood Gluc Receiver (FREESTYLE LIBRE 2 READER) DEVI Use to check blood sugar continuous as instructed. 1 each 0   Continuous Blood Gluc Transmit (DEXCOM G6 TRANSMITTER) MISC Check blood sugar as recommended. 1 each 0   desvenlafaxine (PRISTIQ) 100 MG 24 hr tablet Take 1 tablet (100 mg total) by mouth daily. 90 tablet 1   fexofenadine (ALLEGRA) 180 MG tablet Take 180 mg by mouth daily as needed for allergies.     glucose blood (FREESTYLE LITE) test strip Use as directed to check blood glucose. 100 strip 3   glucose monitoring kit (FREESTYLE) monitoring kit 1 each by Does not apply route as needed for other. 1 each  0   ibuprofen (ADVIL,MOTRIN) 600 MG tablet Take 400 mg by mouth 2 (two) times daily.     indapamide (LOZOL) 2.5 MG tablet Take 1 tablet (2.5 mg total) by mouth daily. 90 tablet 3   ipratropium (ATROVENT) 0.03 % nasal spray Place 2 sprays into both nostrils every 12 (twelve) hours as needed for rhinitis.     MAGNESIUM GLUCONATE PO Take 400 mg by mouth daily.     methocarbamol (ROBAXIN) 500 MG tablet Take 1 tablet by mouth 4 times a day for 5 days. 20 tablet 0   propranolol (INDERAL) 10 MG tablet TAKE 1 TABLET BY MOUTH ONCE DAILY AS NEEDED 90 tablet 1   pseudoephedrine (SUDAFED) 60 MG tablet Take 60 mg by mouth every 4 (four) hours as needed for congestion.     tamsulosin (FLOMAX) 0.4 MG CAPS capsule Take 1 capsule (0.4 mg total) by mouth daily. Take as needed for stone episodes. 30 capsule 2   Turmeric 450 MG CAPS Take 450 mg by mouth daily.      predniSONE (STERAPRED UNI-PAK 21 TAB) 10 MG (21) TBPK tablet Take as directed 21 tablet 0   Semaglutide-Weight Management (WEGOVY) 2.4 MG/0.75ML SOAJ Inject 2.4 mg into the skin once a week. 3 mL 11   fluticasone (FLONASE) 50 MCG/ACT nasal spray USE 2 SPRAYS IN EACH NOSTIL ONCE A DAY 48 g 3   COVID-19 At Home Antigen Test (CARESTART COVID-19 HOME TEST) KIT Use as directed 2 kit 0   No facility-administered medications prior to visit.     Per HPI unless specifically indicated in ROS section below Review of Systems  Constitutional:  Negative for fatigue and fever.  HENT:  Negative for ear pain.   Eyes:  Negative for pain.  Respiratory:  Negative for cough and shortness of breath.   Cardiovascular:  Negative for chest pain, palpitations and leg swelling.  Gastrointestinal:  Negative for abdominal pain.  Genitourinary:  Negative for dysuria.  Musculoskeletal:  Negative for arthralgias.  Neurological:  Negative for syncope, light-headedness and headaches.  Psychiatric/Behavioral:  Negative for dysphoric mood.    Objective:  BP 90/70   Pulse 88   Temp 97.8 F (36.6 C) (Temporal)   Ht 5' 6.25" (1.683 m)   Wt 205 lb 4 oz (93.1 kg)   SpO2 97%   BMI 32.88 kg/m   Wt Readings from Last 3 Encounters:  10/13/22 205 lb 4 oz (93.1 kg)  09/01/22 203 lb (92.1 kg)  06/11/22 210 lb (95.3 kg)      Physical Exam Constitutional:      Appearance: He is well-developed.  HENT:     Head: Normocephalic.     Right Ear: Hearing normal.     Left Ear: Hearing normal.     Nose: Nose normal.  Neck:     Thyroid: No thyroid mass or thyromegaly.     Vascular: No carotid bruit.     Trachea: Trachea normal.  Cardiovascular:     Rate and Rhythm: Normal rate and regular rhythm.     Pulses: Normal pulses.     Heart sounds: Heart sounds not distant. No murmur heard.    No friction rub. No gallop.     Comments: No peripheral edema Pulmonary:     Effort: Pulmonary effort is normal. No respiratory  distress.     Breath sounds: Normal breath sounds.  Skin:    General: Skin is warm and dry.     Findings: No rash.  Psychiatric:  Speech: Speech normal.        Behavior: Behavior normal.        Thought Content: Thought content normal.       Results for orders placed or performed in visit on 10/09/22  VITAMIN D 25 Hydroxy (Vit-D Deficiency, Fractures)  Result Value Ref Range   VITD 14.59 (L) 30.00 - 100.00 ng/mL  Comprehensive metabolic panel  Result Value Ref Range   Sodium 142 135 - 145 mEq/L   Potassium 3.9 3.5 - 5.1 mEq/L   Chloride 98 96 - 112 mEq/L   CO2 35 (H) 19 - 32 mEq/L   Glucose, Bld 86 70 - 99 mg/dL   BUN 17 6 - 23 mg/dL   Creatinine, Ser 0.84 0.40 - 1.50 mg/dL   Total Bilirubin 0.5 0.2 - 1.2 mg/dL   Alkaline Phosphatase 50 39 - 117 U/L   AST 17 0 - 37 U/L   ALT 27 0 - 53 U/L   Total Protein 6.9 6.0 - 8.3 g/dL   Albumin 4.5 3.5 - 5.2 g/dL   GFR 106.16 >60.00 mL/min   Calcium 9.7 8.4 - 10.5 mg/dL  PSA  Result Value Ref Range   PSA 0.48 0.10 - 4.00 ng/mL  Lipid panel  Result Value Ref Range   Cholesterol 244 (H) 0 - 200 mg/dL   Triglycerides 94.0 0.0 - 149.0 mg/dL   HDL 53.80 >39.00 mg/dL   VLDL 18.8 0.0 - 40.0 mg/dL   LDL Cholesterol 172 (H) 0 - 99 mg/dL   Total CHOL/HDL Ratio 5    NonHDL 190.50     This visit occurred during the SARS-CoV-2 public health emergency.  Safety protocols were in place, including screening questions prior to the visit, additional usage of staff PPE, and extensive cleaning of exam room while observing appropriate contact time as indicated for disinfecting solutions.   COVID 19 screen:  No recent travel or known exposure to COVID19 The patient denies respiratory symptoms of COVID 19 at this time. The importance of social distancing was discussed today.   Assessment and Plan   The patient's preventative maintenance and recommended screening tests for an annual wellness exam were reviewed in full today. Brought up to  date unless services declined.  Counselled on the importance of diet, exercise, and its role in overall health and mortality. The patient's FH and SH was reviewed, including their home life, tobacco status, and drug and alcohol status.   Vaccines: Uptodate Tdap and flu, has completed three COVID vaccine No family history of  first degree prostate cancer.    MGF prostate cancer .   Lab Results  Component Value Date   PSA 0.48 10/09/2022   PSA 0.42 10/01/2021   PSA 0.41 09/29/2019   Mother with mutiple polyps, MGM colon cancer, uncle large polyp. No symptoms. Colonoscopy:2020 precancerous polyps.. repeat in 5 years. Dr. Hilarie Fredrickson. No desire for STD screen.   No smoking   Problem List Items Addressed This Visit     High cholesterol    Low 10-year CVD risk score, but significant family history and worsening cholesterol.  We will proceed with a calcium CT score to estimate risk and possible need for statin initiation.  He will continue working on low-cholesterol heart healthy diet.      Relevant Orders   CT CARDIAC SCORING (SELF PAY ONLY)   Lipid panel   Comprehensive metabolic panel   Major depressive disorder, recurrent, moderate (HCC)    Stable, chronic.  Continue current medication.Marland Kitchen  Was unable to wean without irritability recurring.   Pristiq 100 mg XL daily.      Obesity (BMI 30.0-34.9)    Continue to work on [employer] management on semaglutide 2.4 mg weekly.      Obstructive sleep apnea     On CPAP.Marland Kitchen  Has lost weight but has no issue with continuing CPAP.  Feels as though it fits well and works well for him.      Pulmonary granuloma (HCC)    No further eval needed.      Vitamin D deficiency    Replete with Vit D OTC.  Feels high-dose vitamin D at 50,000 units weekly was possibly associated with his kidney stones so he would like to avoid this.  Will reevaluate him in 6 months      Relevant Orders   VITAMIN D 25 Hydroxy (Vit-D Deficiency, Fractures)   Other Visit  Diagnoses     Routine general medical examination at a health care facility    -  Primary      Eliezer Lofts, MD

## 2022-10-15 ENCOUNTER — Other Ambulatory Visit: Payer: Self-pay

## 2022-10-16 ENCOUNTER — Other Ambulatory Visit: Payer: Self-pay

## 2022-10-23 ENCOUNTER — Ambulatory Visit
Admission: RE | Admit: 2022-10-23 | Discharge: 2022-10-23 | Disposition: A | Discharge status: Home / Self Care | Payer: 59 | Source: Ambulatory Visit | Attending: Family Medicine | Admitting: Family Medicine

## 2022-10-23 DIAGNOSIS — E7800 Pure hypercholesterolemia, unspecified: Secondary | ICD-10-CM | POA: Insufficient documentation

## 2022-10-28 ENCOUNTER — Other Ambulatory Visit: Payer: Self-pay

## 2022-10-29 ENCOUNTER — Other Ambulatory Visit: Payer: Self-pay

## 2022-11-04 ENCOUNTER — Other Ambulatory Visit: Payer: Self-pay

## 2022-11-06 DIAGNOSIS — H5213 Myopia, bilateral: Secondary | ICD-10-CM | POA: Diagnosis not present

## 2022-11-22 NOTE — Progress Notes (Signed)
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 Uses Travel machine some.Sleeps better with CPAP and prefers the humidifier of main machine over disc in [employer] travel machine.  Dealing with kidney stones. CXR 07/12/21-  IMPRESSION:  Suggestion of bronchitis, especially on the left.  No focal  infiltrate identified.   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 Good compliance. Download reviewed.  He notices an occasional pressure sore inside his nostrils from the nasal pillows.  We discussed management and mask alternatives. He uses Flonase for allergic rhinitis.   ROS-see HPI   + = positive Constitutional:   + weight loss, night sweats, fevers, chills, fatigue, lassitude. HEENT:    +headaches, difficulty swallowing, tooth/dental problems, sore throat,       sneezing, itching, ear ache, +nasal congestion, post nasal drip, snoring CV:    chest pain, orthopnea, PND, swelling in lower extremities, anasarca,                                                        dizziness, palpitations Resp:   shortness of breath with exertion or at  rest.                productive cough,   non-productive cough, coughing up of blood.              change in color of mucus.  wheezing.   Skin:    rash or lesions. GI:  No-   heartburn, indigestion, abdominal pain, nausea, vomiting, diarrhea,                 change in bowel habits, loss of appetite GU: dysuria, change in color of urine, no urgency or frequency.   flank pain. MS:   joint pain, stiffness, decreased range of motion, back pain. Neuro-     nothing unusual Psych:  change in mood or affect.  +depression or anxiety.   memory loss.  OBJ- Physical Exam General- Alert, Oriented, Affect-appropriate, Distress- none acute,  + overweight Skin- rash-none, lesions- none, excoriation- none Lymphadenopathy- none Head- atraumatic            Eyes- Gross vision intact, PERRLA, conjunctivae and secretions clear            Ears- Hearing, canals-normal            Nose- Clear, no-Septal dev, mucus, polyps, erosion, perforation             Throat- Mallampati III , mucosa clear , drainage- none, tonsils- atrophic Neck- flexible , trachea midline, no stridor , thyroid nl, carotid no bruit Chest - symmetrical excursion , unlabored  Heart/CV- RRR , no murmur , no gallop  , no rub, nl s1 s2                           - JVD- none , edema- none, stasis changes- none, varices- none           Lung- clear to P&A, wheeze- none, cough- none , dullness-none, rub- none           Chest wall-  Abd-  Br/ Gen/ Rectal- Not done, not indicated Extrem- cyanosis- none, clubbing, none, atrophy- none, strength- nl Neuro- grossly intact to observation

## 2022-11-24 ENCOUNTER — Ambulatory Visit (INDEPENDENT_AMBULATORY_CARE_PROVIDER_SITE_OTHER): Payer: 59 | Admitting: Internal Medicine

## 2022-11-24 ENCOUNTER — Encounter: Payer: Self-pay | Admitting: Internal Medicine

## 2022-11-24 VITALS — BP 116/84 | HR 95 | Ht 67.0 in | Wt 209.4 lb

## 2022-11-24 DIAGNOSIS — J3089 Other allergic rhinitis: Secondary | ICD-10-CM

## 2022-11-24 DIAGNOSIS — G4733 Obstructive sleep apnea (adult) (pediatric): Secondary | ICD-10-CM

## 2022-11-24 DIAGNOSIS — J302 Other seasonal allergic rhinitis: Secondary | ICD-10-CM | POA: Diagnosis not present

## 2022-11-24 NOTE — Patient Instructions (Signed)
We can continue CPAP auto 5-20  Please call if we can help 

## 2022-11-25 ENCOUNTER — Other Ambulatory Visit: Payer: Self-pay

## 2022-12-02 ENCOUNTER — Other Ambulatory Visit (HOSPITAL_COMMUNITY): Payer: Self-pay

## 2022-12-11 ENCOUNTER — Other Ambulatory Visit (HOSPITAL_COMMUNITY): Payer: Self-pay

## 2022-12-11 ENCOUNTER — Other Ambulatory Visit: Payer: Self-pay | Admitting: Family Medicine

## 2022-12-11 MED ORDER — DESVENLAFAXINE SUCCINATE ER 100 MG PO TB24
100.0000 mg | ORAL_TABLET | Freq: Every day | ORAL | 1 refills | Status: DC
  Filled 2022-12-11: qty 90, 90d supply, fill #0
  Filled 2023-03-09 – 2023-03-24 (×3): qty 90, 90d supply, fill #1

## 2022-12-12 DIAGNOSIS — G4733 Obstructive sleep apnea (adult) (pediatric): Secondary | ICD-10-CM | POA: Diagnosis not present

## 2022-12-24 ENCOUNTER — Other Ambulatory Visit (HOSPITAL_COMMUNITY): Payer: Self-pay

## 2022-12-25 ENCOUNTER — Other Ambulatory Visit (HOSPITAL_COMMUNITY): Payer: Self-pay

## 2022-12-25 NOTE — Assessment & Plan Note (Signed)
Benefits from CPAP with good compliance and control Plan-mask comfort issues and management discussed.  Continue auto 5-20

## 2022-12-25 NOTE — Assessment & Plan Note (Signed)
Options for antihistamines and nasal steroid spray discussed.

## 2023-01-04 ENCOUNTER — Other Ambulatory Visit (HOSPITAL_COMMUNITY): Payer: Self-pay

## 2023-01-05 ENCOUNTER — Other Ambulatory Visit (HOSPITAL_COMMUNITY): Payer: Self-pay

## 2023-01-06 ENCOUNTER — Other Ambulatory Visit: Payer: Self-pay

## 2023-01-19 ENCOUNTER — Ambulatory Visit (INDEPENDENT_AMBULATORY_CARE_PROVIDER_SITE_OTHER): Payer: 59 | Admitting: Podiatry

## 2023-01-19 ENCOUNTER — Encounter: Payer: Self-pay | Admitting: Podiatry

## 2023-01-19 DIAGNOSIS — L603 Nail dystrophy: Secondary | ICD-10-CM

## 2023-01-19 NOTE — Progress Notes (Signed)
Subjective:  Patient ID: Aaron Malone, male    DOB: 11/28/1975,  MRN: [id number]  Chief Complaint  Patient presents with   Nail Problem    Pt states  that he has a crack in his nail that he is concerned about no pain or discomfort     47 y.o. male presents with the above complaint.  Patient presents with a new complaint of right hallux nail dystrophy.  Patient states that he went skiing and may have cracked his nail.  Denies any other acute complaints there is no discomfort associated with it.  He wanted to get it evaluated.  He denies any other acute complaints.  He has not seen MRIs prior to seeing me   Review of Systems: Negative except as noted in the HPI. Denies N/V/F/Ch.  Past Medical History:  Diagnosis Date   Carpal tunnel syndrome    bilateral   COVID-19 08/2020   Gallstones 2012   History of kidney stones    Hyperlipidemia    Major depressive disorder, single episode, moderate (HCC)    Obesity    Sleep apnea    uses a CPAP    Current Outpatient Medications:    acetaminophen (TYLENOL) 500 MG tablet, Take 500 mg by mouth every 6 (six) hours as needed., Disp: , Rfl:    cholecalciferol (VITAMIN D) 25 MCG (1000 UNIT) tablet, Take 2,000 Units by mouth daily., Disp: , Rfl:    Continuous Blood Gluc Receiver (FREESTYLE LIBRE 2 READER) DEVI, Use to check blood sugar continuous as instructed., Disp: 1 each, Rfl: 0   Continuous Blood Gluc Transmit (DEXCOM G6 TRANSMITTER) MISC, Check blood sugar as recommended., Disp: 1 each, Rfl: 0   desvenlafaxine (PRISTIQ) 100 MG 24 hr tablet, Take 1 tablet (100 mg total) by mouth daily., Disp: 90 tablet, Rfl: 1   fexofenadine (ALLEGRA) 180 MG tablet, Take 180 mg by mouth daily as needed for allergies., Disp: , Rfl:    fluticasone (FLONASE) 50 MCG/ACT nasal spray, USE 2 SPRAYS IN EACH NOSTIL ONCE A DAY, Disp: 48 g, Rfl: 3   glucose blood (FREESTYLE LITE) test strip, Use as directed to check blood glucose., Disp: 100 strip, Rfl: 3   glucose  monitoring kit (FREESTYLE) monitoring kit, 1 each by Does not apply route as needed for other., Disp: 1 each, Rfl: 0   ibuprofen (ADVIL,MOTRIN) 600 MG tablet, Take 400 mg by mouth 2 (two) times daily., Disp: , Rfl:    indapamide (LOZOL) 2.5 MG tablet, Take 1 tablet (2.5 mg total) by mouth daily., Disp: 90 tablet, Rfl: 3   ipratropium (ATROVENT) 0.03 % nasal spray, Place 2 sprays into both nostrils every 12 (twelve) hours as needed for rhinitis., Disp: , Rfl:    MAGNESIUM GLUCONATE PO, Take 400 mg by mouth daily., Disp: , Rfl:    methocarbamol (ROBAXIN) 500 MG tablet, Take 1 tablet by mouth 4 times a day for 5 days., Disp: 20 tablet, Rfl: 0   propranolol (INDERAL) 10 MG tablet, TAKE 1 TABLET BY MOUTH ONCE DAILY AS NEEDED, Disp: 90 tablet, Rfl: 1   pseudoephedrine (SUDAFED) 60 MG tablet, Take 60 mg by mouth every 4 (four) hours as needed for congestion., Disp: , Rfl:    Semaglutide-Weight Management (WEGOVY) 2.4 MG/0.75ML SOAJ, Inject 2.4 mg into the skin once a week., Disp: 6 mL, Rfl: 3   tamsulosin (FLOMAX) 0.4 MG CAPS capsule, Take 1 capsule (0.4 mg total) by mouth daily. Take as needed for stone episodes., Disp: 30 capsule,  Rfl: 2   Turmeric 450 MG CAPS, Take 450 mg by mouth daily., Disp: , Rfl:   Social History   Tobacco Use  Smoking Status Never  Smokeless Tobacco Never    No Known Allergies Objective:  There were no vitals filed for this visit. There is no height or weight on file to calculate BMI. Constitutional Well developed. Well nourished.  Vascular Dorsalis pedis pulses palpable bilaterally. Posterior tibial pulses palpable bilaterally. Capillary refill normal to all digits.  No cyanosis or clubbing noted. Pedal hair growth normal.  Neurologic Normal speech. Oriented to person, place, and time. Epicritic sensation to light touch grossly present bilaterally.  Dermatologic Nails mild nail dystrophy noted to the right hallux could also be signs of nail fungus with  discoloration and yellowing of the nail partially Skin within normal limits  Orthopedic: Normal joint ROM without pain or crepitus bilaterally. No visible deformities. No bony tenderness.   Radiographs: None Assessment:   1. Nail dystrophy    Plan:  Patient was evaluated and treated and all questions answered.  Right hallux nail dystrophy versus nail fungus -All questions and concerns were discussed with the patient in extensive detail using a nail nipper the damaged nail was partially debrided down.  At this time the rest of the nail was well adhered to the underlying nailbed.  I discussed with him to continue clinical monitoring if it continues to regress or gets worse to come back and see me.  He states understanding  No follow-ups on file.

## 2023-01-25 ENCOUNTER — Other Ambulatory Visit (HOSPITAL_COMMUNITY): Payer: Self-pay

## 2023-03-09 ENCOUNTER — Encounter (HOSPITAL_COMMUNITY): Payer: Self-pay

## 2023-03-09 ENCOUNTER — Other Ambulatory Visit (HOSPITAL_COMMUNITY): Payer: Self-pay

## 2023-03-24 ENCOUNTER — Other Ambulatory Visit (HOSPITAL_COMMUNITY): Payer: Self-pay

## 2023-03-25 ENCOUNTER — Other Ambulatory Visit: Payer: Self-pay | Admitting: Oncology

## 2023-03-25 DIAGNOSIS — Z006 Encounter for examination for normal comparison and control in clinical research program: Secondary | ICD-10-CM

## 2023-03-26 ENCOUNTER — Other Ambulatory Visit
Admission: RE | Admit: 2023-03-26 | Discharge: 2023-03-26 | Disposition: A | Discharge status: Home / Self Care | Payer: 59 | Attending: Oncology | Admitting: Oncology

## 2023-03-26 DIAGNOSIS — Z006 Encounter for examination for normal comparison and control in clinical research program: Secondary | ICD-10-CM | POA: Insufficient documentation

## 2023-04-19 ENCOUNTER — Other Ambulatory Visit: Payer: Self-pay | Admitting: Physician Assistant

## 2023-04-20 ENCOUNTER — Other Ambulatory Visit (HOSPITAL_COMMUNITY): Payer: Self-pay

## 2023-04-20 MED ORDER — INDAPAMIDE 2.5 MG PO TABS
2.5000 mg | ORAL_TABLET | Freq: Every day | ORAL | 3 refills | Status: DC
  Filled 2023-04-20: qty 90, 90d supply, fill #0
  Filled 2023-07-22: qty 90, 90d supply, fill #1
  Filled 2023-10-17: qty 90, 90d supply, fill #2
  Filled 2024-01-18: qty 90, 90d supply, fill #3

## 2023-04-30 ENCOUNTER — Other Ambulatory Visit (HOSPITAL_COMMUNITY): Payer: Self-pay

## 2023-05-17 ENCOUNTER — Encounter: Payer: Self-pay | Admitting: Family Medicine

## 2023-06-10 DIAGNOSIS — G4733 Obstructive sleep apnea (adult) (pediatric): Secondary | ICD-10-CM | POA: Diagnosis not present

## 2023-06-14 ENCOUNTER — Ambulatory Visit: Payer: 59 | Admitting: Physician Assistant

## 2023-06-15 ENCOUNTER — Other Ambulatory Visit: Payer: Self-pay | Admitting: Family Medicine

## 2023-06-15 ENCOUNTER — Other Ambulatory Visit (HOSPITAL_COMMUNITY): Payer: Self-pay

## 2023-06-15 MED ORDER — DESVENLAFAXINE SUCCINATE ER 100 MG PO TB24
100.0000 mg | ORAL_TABLET | Freq: Every day | ORAL | 1 refills | Status: DC
  Filled 2023-06-15: qty 90, 90d supply, fill #0
  Filled 2023-09-14: qty 90, 90d supply, fill #1

## 2023-06-15 NOTE — Telephone Encounter (Signed)
Last office visit 10/13/22 for CPE.  Last refilled 12/11/22 for #90 with 1 refill.  Next Appt: No future appointments with PCP.

## 2023-06-16 ENCOUNTER — Ambulatory Visit: Payer: 59 | Admitting: Physician Assistant

## 2023-06-18 ENCOUNTER — Other Ambulatory Visit (HOSPITAL_COMMUNITY): Payer: Self-pay

## 2023-06-18 ENCOUNTER — Ambulatory Visit
Admission: RE | Admit: 2023-06-18 | Discharge: 2023-06-18 | Disposition: A | Discharge status: Home / Self Care | Payer: 59 | Source: Ambulatory Visit | Attending: Physician Assistant

## 2023-06-18 ENCOUNTER — Other Ambulatory Visit: Payer: Self-pay

## 2023-06-18 ENCOUNTER — Ambulatory Visit
Admission: RE | Admit: 2023-06-18 | Discharge: 2023-06-18 | Disposition: A | Discharge status: Home / Self Care | Payer: 59 | Attending: Physician Assistant | Admitting: Physician Assistant

## 2023-06-18 ENCOUNTER — Ambulatory Visit: Payer: 59 | Admitting: Physician Assistant

## 2023-06-18 VITALS — BP 122/85 | HR 76 | Ht 67.0 in | Wt 226.2 lb

## 2023-06-18 DIAGNOSIS — N200 Calculus of kidney: Secondary | ICD-10-CM

## 2023-06-18 DIAGNOSIS — R399 Unspecified symptoms and signs involving the genitourinary system: Secondary | ICD-10-CM

## 2023-06-18 DIAGNOSIS — I878 Other specified disorders of veins: Secondary | ICD-10-CM | POA: Diagnosis not present

## 2023-06-18 LAB — BLADDER SCAN AMB NON-IMAGING: Scan Result: 39

## 2023-06-18 MED ORDER — TAMSULOSIN HCL 0.4 MG PO CAPS
0.4000 mg | ORAL_CAPSULE | Freq: Every day | ORAL | 2 refills | Status: AC
  Filled 2023-06-18: qty 30, 30d supply, fill #0

## 2023-06-18 NOTE — Progress Notes (Signed)
06/18/2023 10:15 AM   Aaron Malone 10/30/1975 [id number]  CC: Chief Complaint  Patient presents with   Nephrolithiasis   HPI: Aaron Malone is a 47 y.o. male with PMH recurrent stone disease with hypercalciuria on 2.5 mg indapamide daily who presents today for routine follow-up.   Today he reports no flank pain or gross hematuria.  KUB today with stable bilateral renal stones.  He wonders about his prostate health today.  He notices daytime frequency every 2 hours and urgency without urge incontinence.  He tends to double void only at bedtime.  Nocturia x 0-1.  He denies intermittency, weak stream, hesitancy, or straining.  His PCP checked his PSA in February and it was normal at 0.48.  Has been rather stable over the past 3 years.  He does have a family history of prostate cancer in his grandfather.  PVR 39mL.  PMH: Past Medical History:  Diagnosis Date   Carpal tunnel syndrome    bilateral   COVID-19 08/2020   Gallstones 2012   History of kidney stones    Hyperlipidemia    Major depressive disorder, single episode, moderate (HCC)    Obesity    Sleep apnea    uses a CPAP    Surgical History: Past Surgical History:  Procedure Laterality Date   CARPAL TUNNEL RELEASE Bilateral 08/24/2014   Procedure: RIGHT LIMITED OPEN CARPAL TUNNEL RELEASE, LEFT CARPAL  TUNNEL INJECTION;  Surgeon: Dominica Severin, MD;  Location: [facility];  Service: Orthopedics;  Laterality: Bilateral;   CARPAL TUNNEL RELEASE Left 06/20/2015   Procedure: LEFT CARPAL TUNNEL RELEASE;  Surgeon: Dominica Severin, MD;  Location: [facility];  Service: Orthopedics;  Laterality: Left;   CHOLECYSTECTOMY  2012   COLONOSCOPY     CYSTOSCOPY W/ RETROGRADES Bilateral 12/16/2020   Procedure: CYSTOSCOPY WITH RETROGRADE PYELOGRAM;  Surgeon: Vanna Scotland, MD;  Location: [facility];  Service: Urology;  Laterality: Bilateral;   CYSTOSCOPY/URETEROSCOPY/HOLMIUM LASER/STENT PLACEMENT Right  12/16/2020   Procedure: CYSTOSCOPY/URETEROSCOPY/HOLMIUM LASER/STENT PLACEMENT;  Surgeon: Vanna Scotland, MD;  Location: [facility];  Service: Urology;  Laterality: Right;   EXTRACORPOREAL SHOCK WAVE LITHOTRIPSY Right 10/31/2020   Procedure: EXTRACORPOREAL SHOCK WAVE LITHOTRIPSY (ESWL);  Surgeon: Vanna Scotland, MD;  Location: [facility];  Service: Urology;  Laterality: Right;   INGUINAL HERNIA REPAIR Bilateral 1983   LITHOTRIPSY  12/2003, 09/2006   TRIGGER FINGER RELEASE     thumb    Home Medications:  Allergies as of 06/18/2023   No Known Allergies      Medication List        Accurate as of June 18, 2023 10:15 AM. If you have any questions, ask your nurse or doctor.          acetaminophen 500 MG tablet Commonly known as: TYLENOL Take 500 mg by mouth every 6 (six) hours as needed.   cholecalciferol 25 MCG (1000 UNIT) tablet Commonly known as: VITAMIN D3 Take 2,000 Units by mouth daily.   desvenlafaxine 100 MG 24 hr tablet Commonly known as: PRISTIQ Take 1 tablet (100 mg total) by mouth daily.   Dexcom G6 Transmitter Misc Check blood sugar as recommended.   fexofenadine 180 MG tablet Commonly known as: ALLEGRA Take 180 mg by mouth daily as needed for allergies.   fluticasone 50 MCG/ACT nasal spray Commonly known as: FLONASE USE 2 SPRAYS IN EACH NOSTIL ONCE A DAY   FreeStyle Libre 2 Reader Devi Use to check blood sugar continuous as instructed.   FREESTYLE  LITE test strip Generic drug: glucose blood Use as directed to check blood glucose.   glucose monitoring kit monitoring kit 1 each by Does not apply route as needed for other.   ibuprofen 600 MG tablet Commonly known as: ADVIL Take 400 mg by mouth 2 (two) times daily.   indapamide 2.5 MG tablet Commonly known as: LOZOL Take 1 tablet (2.5 mg total) by mouth daily.   ipratropium 0.03 % nasal spray Commonly known as: ATROVENT Place 2 sprays into both nostrils every 12 (twelve) hours as needed for  rhinitis.   MAGNESIUM GLUCONATE PO Take 400 mg by mouth daily.   methocarbamol 500 MG tablet Commonly known as: ROBAXIN Take 1 tablet by mouth 4 times a day for 5 days.   propranolol 10 MG tablet Commonly known as: INDERAL TAKE 1 TABLET BY MOUTH ONCE DAILY AS NEEDED   pseudoephedrine 60 MG tablet Commonly known as: SUDAFED Take 60 mg by mouth every 4 (four) hours as needed for congestion.   tamsulosin 0.4 MG Caps capsule Commonly known as: FLOMAX Take 1 capsule (0.4 mg total) by mouth daily. Take as needed for stone episodes.   Turmeric 450 MG Caps Take 450 mg by mouth daily.   Wegovy 2.4 MG/0.75ML Soaj Generic drug: Semaglutide-Weight Management Inject 2.4 mg into the skin once a week.        Allergies:  No Known Allergies  Family History: Family History  Problem Relation Age of Onset   Hyperlipidemia Mother    Hypertension Mother    Irritable bowel syndrome Mother    Colon polyps Mother        x lots   Polycystic ovary syndrome Sister    Colon cancer Maternal Grandmother        dx in her 52's or 53's   Lung cancer Maternal Grandmother    Heart failure Maternal Grandfather    Heart disease Maternal Grandfather    Prostate cancer Maternal Grandfather    Multiple myeloma Paternal Grandfather    Diabetes Paternal Grandmother    Colon polyps Maternal Uncle     Social History:   reports that he has never smoked. He has never used smokeless tobacco. He reports current alcohol use. He reports that he does not use drugs.  Physical Exam: BP 122/85   Pulse 76   Ht 5\' 7"  (1.702 m)   Wt 226 lb 4 oz (102.6 kg)   BMI 35.44 kg/m   Constitutional:  Alert and oriented, no acute distress, nontoxic appearing HEENT: [place], AT Cardiovascular: No clubbing, cyanosis, or edema Respiratory: Normal respiratory effort, no increased work of breathing Skin: No rashes, bruises or suspicious lesions Neurologic: Grossly intact, no focal deficits, moving all 4  extremities Psychiatric: Normal mood and affect  Laboratory Data: Results for orders placed or performed in visit on 06/18/23  BLADDER SCAN AMB NON-IMAGING  Result Value Ref Range   Scan Result 39 ml    Pertinent Imaging: KUB, 06/18/2023: CLINICAL DATA:  Follow-up kidney stone.   EXAM: ABDOMEN - 1 VIEW   COMPARISON:  Radiograph 09/01/2022.   FINDINGS: 8 mm stone projects over the mid right kidney. 3 mm stone projects over the lower left kidney. No change from prior exam. No definite new urolithiasis. Stable distribution of pelvic phleboliths. Normal bowel gas pattern.   IMPRESSION: Unchanged bilateral renal stones.     Electronically Signed   By: Narda Rutherford M.D.   On: 07/10/2023 18:24  I personally reviewed the images referenced above and note  stable bilateral renal stones.  Assessment & Plan:   1. Nephrolithiasis Bilateral renal stones appear stable on KUB today.  I am refilling his Flomax so we can take it empirically for flank pain. - Abdomen 1 view (KUB); Future - tamsulosin (FLOMAX) 0.4 MG CAPS capsule; Take 1 capsule (0.4 mg total) by mouth daily. Take as needed for stone episodes.  Dispense: 30 capsule; Refill: 2  2. Lower urinary tract symptoms (LUTS) Frequency, urgency, and double voiding at bedtime, not sufficiently bothersome to try pharmacotherapy.  Will continue to monitor.  He is emptying appropriately.  PSA well within normal range, will continue to monitor. - BLADDER SCAN AMB NON-IMAGING   Return in about 1 year (around 06/17/2024) for Annual f/u with KUB prior, IPSS, PVR.  Carman Ching, PA-C  [facility] [place] [address], [address] [place], [place] [address] [phone]

## 2023-07-29 ENCOUNTER — Other Ambulatory Visit (HOSPITAL_COMMUNITY): Payer: Self-pay

## 2023-08-05 ENCOUNTER — Other Ambulatory Visit (HOSPITAL_COMMUNITY): Payer: Self-pay

## 2023-08-05 MED ORDER — LOTEPREDNOL ETABONATE 0.38 % OP GEL
OPHTHALMIC | 0 refills | Status: AC
  Filled 2023-08-05: qty 5, 33d supply, fill #0

## 2023-08-19 ENCOUNTER — Other Ambulatory Visit (HOSPITAL_COMMUNITY): Payer: Self-pay

## 2023-09-08 DIAGNOSIS — G4733 Obstructive sleep apnea (adult) (pediatric): Secondary | ICD-10-CM | POA: Diagnosis not present

## 2023-09-10 DIAGNOSIS — D2339 Other benign neoplasm of skin of other parts of face: Secondary | ICD-10-CM | POA: Diagnosis not present

## 2023-09-14 ENCOUNTER — Encounter: Payer: Self-pay | Admitting: Family Medicine

## 2023-10-11 ENCOUNTER — Telehealth: Payer: Self-pay | Admitting: *Deleted

## 2023-10-11 DIAGNOSIS — E7800 Pure hypercholesterolemia, unspecified: Secondary | ICD-10-CM

## 2023-10-11 DIAGNOSIS — Z125 Encounter for screening for malignant neoplasm of prostate: Secondary | ICD-10-CM

## 2023-10-11 DIAGNOSIS — E559 Vitamin D deficiency, unspecified: Secondary | ICD-10-CM

## 2023-10-11 NOTE — Telephone Encounter (Signed)
-----   Message from Lovena Neighbours sent at 10/11/2023  3:02 PM EST ----- Regarding: Labs for Friday 3.21.25 Please put physical lab orders in future. Thank you, Denny Peon

## 2023-10-12 ENCOUNTER — Other Ambulatory Visit: Payer: 59

## 2023-10-18 ENCOUNTER — Other Ambulatory Visit (HOSPITAL_COMMUNITY): Payer: Self-pay

## 2023-10-22 ENCOUNTER — Encounter: Payer: 59 | Admitting: Family Medicine

## 2023-10-27 ENCOUNTER — Encounter: Payer: Self-pay | Admitting: Family Medicine

## 2023-10-28 ENCOUNTER — Other Ambulatory Visit (HOSPITAL_COMMUNITY): Payer: Self-pay

## 2023-10-28 ENCOUNTER — Telehealth: Admitting: Physician Assistant

## 2023-10-28 DIAGNOSIS — Z20828 Contact with and (suspected) exposure to other viral communicable diseases: Secondary | ICD-10-CM

## 2023-10-28 MED ORDER — OSELTAMIVIR PHOSPHATE 75 MG PO CAPS
75.0000 mg | ORAL_CAPSULE | Freq: Every day | ORAL | 0 refills | Status: AC
  Filled 2023-10-28: qty 10, 10d supply, fill #0

## 2023-10-28 NOTE — Progress Notes (Signed)
 Virtual Visit Consent   Aaron Malone, you are scheduled for a virtual visit with a [facility] provider today. Just as with appointments in the office, your consent must be obtained to participate. Your consent will be active for this visit and any virtual visit you may have with one of our providers in the next 365 days. If you have a MyChart account, a copy of this consent can be sent to you electronically.  As this is a virtual visit, video technology does not allow for your provider to perform a traditional examination. This may limit your provider's ability to fully assess your condition. If your provider identifies any concerns that need to be evaluated in person or the need to arrange testing (such as labs, EKG, etc.), we will make arrangements to do so. Although advances in technology are sophisticated, we cannot ensure that it will always work on either your end or our end. If the connection with a video visit is poor, the visit may have to be switched to a telephone visit. With either a video or telephone visit, we are not always able to ensure that we have a secure connection.  By engaging in this virtual visit, you consent to the provision of healthcare and authorize for your insurance to be billed (if applicable) for the services provided during this visit. Depending on your insurance coverage, you may receive a charge related to this service.  I need to obtain your verbal consent now. Are you willing to proceed with your visit today? Aaron Malone has provided verbal consent on 10/28/2023 for a virtual visit (video or telephone). Piedad Climes, [place]  Date: 10/28/2023 8:40 AM   Virtual Visit via Video Note   I, Piedad Climes, connected with  Aaron Malone  ([id number], 1976-07-24) on 10/28/23 at  8:30 AM EDT by a video-enabled telemedicine application and verified that I am speaking with the correct person using two identifiers.  Location: Patient: Virtual Visit  Location Patient: Home Provider: Virtual Visit Location Provider: Home Office   I discussed the limitations of evaluation and management by telemedicine and the availability of in person appointments. The patient expressed understanding and agreed to proceed.    History of Present Illness: Aaron Malone is a 48 y.o. who identifies as a male who was assigned male at birth, and is being seen today for discussion of prophylactic medication. Notes wife diagnosed with the flu this week, symptoms starting 2 days ago. Initially negative testing but repeat testing yesterday was positive for Flu A.   Is currently without symptoms. Has taken Tamiflu before for treatment without any noted side effect or intolerance.    HPI: HPI  Problems:  Patient Active Problem List   Diagnosis Date Noted   Elevated transaminase level 04/29/2021   Fatty liver 04/29/2021   Obesity (BMI 30.0-34.9) 03/04/2021   Vitamin D deficiency 04/22/2020   Situational anxiety 07/31/2016   Nephrolithiasis 07/31/2016   Obstructive sleep apnea 07/29/2016   Tick bite of pelvic region 03/08/2012   Pulmonary granuloma (HCC) 11/08/2010   Major depressive disorder, recurrent, moderate (HCC) 06/13/2010   High cholesterol 05/30/2010   Seasonal and perennial allergic rhinitis 11/23/2008   NEPHROLITHIASIS, HX OF 06/15/2007    Allergies: No Known Allergies Medications:  Current Outpatient Medications:    oseltamivir (TAMIFLU) 75 MG capsule, Take 1 capsule (75 mg total) by mouth daily for 10 days., Disp: 10 capsule, Rfl: 0   acetaminophen (TYLENOL) 500 MG tablet, Take 500  mg by mouth every 6 (six) hours as needed., Disp: , Rfl:    cholecalciferol (VITAMIN D) 25 MCG (1000 UNIT) tablet, Take 2,000 Units by mouth daily., Disp: , Rfl:    Continuous Blood Gluc Receiver (FREESTYLE LIBRE 2 READER) DEVI, Use to check blood sugar continuous as instructed., Disp: 1 each, Rfl: 0   Continuous Blood Gluc Transmit (DEXCOM G6 TRANSMITTER) MISC,  Check blood sugar as recommended., Disp: 1 each, Rfl: 0   desvenlafaxine (PRISTIQ) 100 MG 24 hr tablet, Take 1 tablet (100 mg total) by mouth daily., Disp: 90 tablet, Rfl: 1   fexofenadine (ALLEGRA) 180 MG tablet, Take 180 mg by mouth daily as needed for allergies., Disp: , Rfl:    fluticasone (FLONASE) 50 MCG/ACT nasal spray, USE 2 SPRAYS IN EACH NOSTIL ONCE A DAY, Disp: 48 g, Rfl: 3   glucose blood (FREESTYLE LITE) test strip, Use as directed to check blood glucose., Disp: 100 strip, Rfl: 3   glucose monitoring kit (FREESTYLE) monitoring kit, 1 each by Does not apply route as needed for other., Disp: 1 each, Rfl: 0   ibuprofen (ADVIL,MOTRIN) 600 MG tablet, Take 400 mg by mouth 2 (two) times daily., Disp: , Rfl:    indapamide (LOZOL) 2.5 MG tablet, Take 1 tablet (2.5 mg total) by mouth daily., Disp: 90 tablet, Rfl: 3   ipratropium (ATROVENT) 0.03 % nasal spray, Place 2 sprays into both nostrils every 12 (twelve) hours as needed for rhinitis., Disp: , Rfl:    Loteprednol Etabonate (LOTEMAX SM) 0.38 % GEL, Place 1 drop into affected eye 3 times a day, Disp: 5 g, Rfl: 0   MAGNESIUM GLUCONATE PO, Take 400 mg by mouth daily., Disp: , Rfl:    methocarbamol (ROBAXIN) 500 MG tablet, Take 1 tablet by mouth 4 times a day for 5 days., Disp: 20 tablet, Rfl: 0   propranolol (INDERAL) 10 MG tablet, TAKE 1 TABLET BY MOUTH ONCE DAILY AS NEEDED, Disp: 90 tablet, Rfl: 1   pseudoephedrine (SUDAFED) 60 MG tablet, Take 60 mg by mouth every 4 (four) hours as needed for congestion., Disp: , Rfl:    tamsulosin (FLOMAX) 0.4 MG CAPS capsule, Take 1 capsule (0.4 mg total) by mouth daily. Take as needed for stone episodes., Disp: 30 capsule, Rfl: 2   Turmeric 450 MG CAPS, Take 450 mg by mouth daily., Disp: , Rfl:   Observations/Objective: Patient is well-developed, well-nourished in no acute distress.  Resting comfortably at home.  Head is normocephalic, atraumatic.  No labored breathing. Speech is clear and coherent  with logical content.  Patient is alert and oriented at baseline.   Assessment and Plan: 1. Exposure to the flu (Primary) - oseltamivir (TAMIFLU) 75 MG capsule; Take 1 capsule (75 mg total) by mouth daily for 10 days.  Dispense: 10 capsule; Refill: 0  Infection prevention measures reviewed with patient. Will add on Tamiflu daily for 10 days as prophylaxis since he has taken and tolerated before. He is to let us or PCP know if acute symptoms begin.  Follow Up Instructions: I discussed the assessment and treatment plan with the patient. The patient was provided an opportunity to ask questions and all were answered. The patient agreed with the plan and demonstrated an understanding of the instructions.  A copy of instructions were sent to the patient via MyChart unless otherwise noted below.   The patient was advised to call back or seek an in-person evaluation if the symptoms worsen or if the condition fails to improve  as anticipated.    Piedad Climes, PA-C

## 2023-10-28 NOTE — Patient Instructions (Signed)
 Aaron Malone, thank you for joining Piedad Climes, PA-C for today's virtual visit.  While this provider is not your primary care provider (PCP), if your PCP is located in our provider database this encounter information will be shared with them immediately following your visit.   A [facility] MyChart account gives you access to today's visit and all your visits, tests, and labs performed at [facility] " click here if you don't have a  MyChart account or go to [email or web address]  Consent: (Patient) Aaron Malone provided verbal consent for this virtual visit at the beginning of the encounter.  Current Medications:  Current Outpatient Medications:    oseltamivir (TAMIFLU) 75 MG capsule, Take 1 capsule (75 mg total) by mouth daily for 10 days., Disp: 10 capsule, Rfl: 0   acetaminophen (TYLENOL) 500 MG tablet, Take 500 mg by mouth every 6 (six) hours as needed., Disp: , Rfl:    cholecalciferol (VITAMIN D) 25 MCG (1000 UNIT) tablet, Take 2,000 Units by mouth daily., Disp: , Rfl:    Continuous Blood Gluc Receiver (FREESTYLE LIBRE 2 READER) DEVI, Use to check blood sugar continuous as instructed., Disp: 1 each, Rfl: 0   Continuous Blood Gluc Transmit (DEXCOM G6 TRANSMITTER) MISC, Check blood sugar as recommended., Disp: 1 each, Rfl: 0   desvenlafaxine (PRISTIQ) 100 MG 24 hr tablet, Take 1 tablet (100 mg total) by mouth daily., Disp: 90 tablet, Rfl: 1   fexofenadine (ALLEGRA) 180 MG tablet, Take 180 mg by mouth daily as needed for allergies., Disp: , Rfl:    fluticasone (FLONASE) 50 MCG/ACT nasal spray, USE 2 SPRAYS IN EACH NOSTIL ONCE A DAY, Disp: 48 g, Rfl: 3   glucose blood (FREESTYLE LITE) test strip, Use as directed to check blood glucose., Disp: 100 strip, Rfl: 3   glucose monitoring kit (FREESTYLE) monitoring kit, 1 each by Does not apply route as needed for other., Disp: 1 each, Rfl: 0   ibuprofen (ADVIL,MOTRIN) 600 MG tablet, Take 400 mg by mouth 2  (two) times daily., Disp: , Rfl:    indapamide (LOZOL) 2.5 MG tablet, Take 1 tablet (2.5 mg total) by mouth daily., Disp: 90 tablet, Rfl: 3   ipratropium (ATROVENT) 0.03 % nasal spray, Place 2 sprays into both nostrils every 12 (twelve) hours as needed for rhinitis., Disp: , Rfl:    Loteprednol Etabonate (LOTEMAX SM) 0.38 % GEL, Place 1 drop into affected eye 3 times a day, Disp: 5 g, Rfl: 0   MAGNESIUM GLUCONATE PO, Take 400 mg by mouth daily., Disp: , Rfl:    methocarbamol (ROBAXIN) 500 MG tablet, Take 1 tablet by mouth 4 times a day for 5 days., Disp: 20 tablet, Rfl: 0   propranolol (INDERAL) 10 MG tablet, TAKE 1 TABLET BY MOUTH ONCE DAILY AS NEEDED, Disp: 90 tablet, Rfl: 1   pseudoephedrine (SUDAFED) 60 MG tablet, Take 60 mg by mouth every 4 (four) hours as needed for congestion., Disp: , Rfl:    tamsulosin (FLOMAX) 0.4 MG CAPS capsule, Take 1 capsule (0.4 mg total) by mouth daily. Take as needed for stone episodes., Disp: 30 capsule, Rfl: 2   Turmeric 450 MG CAPS, Take 450 mg by mouth daily., Disp: , Rfl:    Medications ordered in this encounter:  Meds ordered this encounter  Medications   oseltamivir (TAMIFLU) 75 MG capsule    Sig: Take 1 capsule (75 mg total) by mouth daily for 10 days.    Dispense:  10 capsule  Refill:  0    Supervising Provider:   Merrilee Jansky [[id number]]     *If you need refills on other medications prior to your next appointment, please contact your pharmacy*  Follow-Up: Call back or seek an in-person evaluation if the symptoms worsen or if the condition fails to improve as anticipated.   Virtual Care [phone]  Other Instructions Preventing Influenza, Adult Influenza, also known as the flu, is an infection caused by a germ called a virus. It affects your respiratory system, which includes your nose, throat, windpipe, and lungs. The flu causes cold symptoms, as well as a high fever and body aches. The flu is contagious, which means it  spreads easily from person to person. You're more likely to get it during flu season, which goes from December to March. You can get the flu by: Breathing in droplets when an infected person coughs or sneezes. Touching something that has the germs on it and then touching your mouth, nose, or eyes. How can the flu affect me? Having the flu can lead to other infections. These may include: A lung infection, like pneumonia. An ear infection. A sinus infection. If you get the flu, you may infect the people around you. The flu can be very serious for: Babies. People 50 years old and older. People with long-term diseases. What can increase my risk? You may be more likely to get the flu if: You don't wash your hands often. You have close contact with a lot of people during flu season. You touch your mouth, eyes, or nose without first washing your hands. You don't get the vaccine for the flu each year. This vaccine is also called the flu shot. You work in health care. You may be at risk for more serious flu symptoms if: You're 65 years and older. You're pregnant. Your body's defense, or immune, system is weak. You have a condition that makes the flu worse. You're very overweight. What actions can I take to prevent the flu? Lifestyle Keep your body's immune system strong. To do this: Eat a healthy diet. Drink enough fluid to keep your pee (urine) pale yellow. Get enough sleep. Get enough exercise. Do not use any products that contain nicotine or tobacco. These products include cigarettes, chewing tobacco, and vaping devices, such as e-cigarettes. If you need help quitting, ask your health care provider. Medicines Get a flu shot each year. It's the best way to prevent the flu. Try to get the shot as soon as you can in the fall. But getting a flu shot in the winter or spring instead is still a good idea. Flu season can last into early spring. You need to get a new flu shot each year. This is  because the germs that cause the flu can change slightly from year to year. If you get the flu after you get the shot, the shot can make your illness shorter and milder. It can also stop you from getting other serious infections caused by the flu. If you're pregnant, you can and should get a flu shot. Check with your provider before getting a flu shot if you've had a reaction to the shot in the past. You may be able to get the vaccine as a nasal spray instead. But the nasal spray may not work as well as the shot. Check with your provider if you have questions about this. Most people who have the flu get better by resting at home and drinking lots  of fluids. But a medicine called an antiviral may lessen your symptoms and make the flu go away faster. People with more serious flu symptoms may need this medicine. Ask your provider if you need it. The medicine must be started within a few days of getting symptoms.  General information     Practice good health habits. Good habits are key during flu season. Avoid contact with people who are sick with flu or cold symptoms. Wash your hands with soap and water often and for at least 20 seconds. If soap and water aren't available, use hand sanitizer. Try not to touch your face, especially if you haven't washed your hands. Clean surfaces at home and at work that may have germs on them. Use a cleanser called a disinfectant that kills germs. If you get the flu: Stay home until your symptoms, such as your fever, have been gone for at least 24 hours. Cover your mouth and nose when you cough or sneeze. Avoid close contact with others.  Where to find more information Centers for Disease Control and Prevention (CDC): TonerPromos.no American Academy of Family Physicians (AAFP): familydoctor.org Contact a health care provider if: You have the flu, and you get new symptoms. You have watery poop, or diarrhea. You have a fever. Your cough gets worse. You make more  mucus. Get help right away if: You have trouble breathing. You have chest pain. These symptoms may be an emergency. Get help right away. Call 911. Do not wait to see if the symptoms will go away. Do not drive yourself to the hospital. This information is not intended to replace advice given to you by your health care provider. Make sure you discuss any questions you have with your health care provider. Document Revised: 10/29/2022 Document Reviewed: 10/29/2022 Elsevier Patient Education  2024 Elsevier Inc.   If you have been instructed to have an in-person evaluation today at a local Urgent Care facility, please use the link below. It will take you to a list of all of our available [facility], including address, phone number and hours of operation. Please do not delay care.  [facility]  If you or a family member do not have a primary care provider, use the link below to schedule a visit and establish care. When you choose a [facility] primary care physician or advanced practice provider, you gain a long-term partner in health. Find a Primary Care Provider  Learn more about 's in-office and virtual care options:  - Get Care Now

## 2023-11-02 ENCOUNTER — Encounter: Payer: Self-pay | Admitting: Family Medicine

## 2023-11-05 ENCOUNTER — Other Ambulatory Visit: Payer: 59

## 2023-11-09 ENCOUNTER — Encounter: Payer: 59 | Admitting: Family Medicine

## 2023-11-09 DIAGNOSIS — H5213 Myopia, bilateral: Secondary | ICD-10-CM | POA: Diagnosis not present

## 2023-11-19 ENCOUNTER — Encounter: Admitting: Family Medicine

## 2023-11-25 ENCOUNTER — Ambulatory Visit: Payer: 59 | Admitting: Internal Medicine

## 2023-11-30 ENCOUNTER — Other Ambulatory Visit: Payer: Self-pay | Admitting: Family Medicine

## 2023-11-30 ENCOUNTER — Other Ambulatory Visit (HOSPITAL_COMMUNITY): Payer: Self-pay

## 2023-12-01 ENCOUNTER — Encounter (HOSPITAL_COMMUNITY): Payer: Self-pay

## 2023-12-01 ENCOUNTER — Other Ambulatory Visit (HOSPITAL_COMMUNITY): Payer: Self-pay

## 2023-12-02 ENCOUNTER — Other Ambulatory Visit (HOSPITAL_COMMUNITY): Payer: Self-pay

## 2023-12-02 MED ORDER — PROPRANOLOL HCL 10 MG PO TABS
ORAL_TABLET | Freq: Every day | ORAL | 1 refills | Status: AC | PRN
  Filled 2023-12-02: qty 90, 90d supply, fill #0

## 2023-12-06 ENCOUNTER — Other Ambulatory Visit (HOSPITAL_COMMUNITY): Payer: Self-pay

## 2023-12-07 DIAGNOSIS — G4733 Obstructive sleep apnea (adult) (pediatric): Secondary | ICD-10-CM | POA: Diagnosis not present

## 2023-12-13 ENCOUNTER — Other Ambulatory Visit (HOSPITAL_COMMUNITY): Payer: Self-pay

## 2023-12-13 ENCOUNTER — Other Ambulatory Visit: Payer: Self-pay | Admitting: Family Medicine

## 2023-12-13 MED ORDER — DESVENLAFAXINE SUCCINATE ER 100 MG PO TB24
100.0000 mg | ORAL_TABLET | Freq: Every day | ORAL | 0 refills | Status: DC
  Filled 2023-12-13 – 2023-12-23 (×2): qty 90, 90d supply, fill #0

## 2023-12-17 ENCOUNTER — Other Ambulatory Visit (INDEPENDENT_AMBULATORY_CARE_PROVIDER_SITE_OTHER)

## 2023-12-17 ENCOUNTER — Encounter: Payer: Self-pay | Admitting: Family Medicine

## 2023-12-17 DIAGNOSIS — E7800 Pure hypercholesterolemia, unspecified: Secondary | ICD-10-CM

## 2023-12-17 DIAGNOSIS — E559 Vitamin D deficiency, unspecified: Secondary | ICD-10-CM

## 2023-12-17 DIAGNOSIS — Z125 Encounter for screening for malignant neoplasm of prostate: Secondary | ICD-10-CM | POA: Diagnosis not present

## 2023-12-17 LAB — COMPREHENSIVE METABOLIC PANEL WITH GFR
ALT: 45 U/L (ref 0–53)
AST: 26 U/L (ref 0–37)
Albumin: 4.4 g/dL (ref 3.5–5.2)
Alkaline Phosphatase: 38 U/L — ABNORMAL LOW (ref 39–117)
BUN: 17 mg/dL (ref 6–23)
CO2: 34 meq/L — ABNORMAL HIGH (ref 19–32)
Calcium: 9.8 mg/dL (ref 8.4–10.5)
Chloride: 99 meq/L (ref 96–112)
Creatinine, Ser: 0.9 mg/dL (ref 0.40–1.50)
GFR: 103.11 mL/min (ref >60.00)
Glucose, Bld: 102 mg/dL — ABNORMAL HIGH (ref 70–99)
Potassium: 4.1 meq/L (ref 3.5–5.1)
Sodium: 141 meq/L (ref 135–145)
Total Bilirubin: 0.5 mg/dL (ref 0.2–1.2)
Total Protein: 6.8 g/dL (ref 6.0–8.3)

## 2023-12-17 LAB — LIPID PANEL
Cholesterol: 258 mg/dL — ABNORMAL HIGH (ref 0–200)
HDL: 47.9 mg/dL (ref >39.00)
LDL Cholesterol: 186 mg/dL — ABNORMAL HIGH (ref 0–99)
NonHDL: 209.87
Total CHOL/HDL Ratio: 5
Triglycerides: 119 mg/dL (ref 0.0–149.0)
VLDL: 23.8 mg/dL (ref 0.0–40.0)

## 2023-12-17 LAB — VITAMIN D 25 HYDROXY (VIT D DEFICIENCY, FRACTURES): VITD: 33.28 ng/mL (ref 30.00–100.00)

## 2023-12-17 LAB — PSA: PSA: 0.48 ng/mL (ref 0.10–4.00)

## 2023-12-17 NOTE — Progress Notes (Signed)
 No critical labs need to be addressed urgently. We will discuss labs in detail at upcoming office visit.

## 2023-12-21 ENCOUNTER — Encounter: Admitting: Family Medicine

## 2023-12-23 ENCOUNTER — Other Ambulatory Visit (HOSPITAL_COMMUNITY): Payer: Self-pay

## 2023-12-31 ENCOUNTER — Encounter: Payer: Self-pay | Admitting: Family Medicine

## 2023-12-31 ENCOUNTER — Ambulatory Visit (INDEPENDENT_AMBULATORY_CARE_PROVIDER_SITE_OTHER): Admitting: Family Medicine

## 2023-12-31 VITALS — BP 110/70 | HR 75 | Temp 97.8°F | Ht 66.25 in | Wt 233.2 lb

## 2023-12-31 DIAGNOSIS — E7800 Pure hypercholesterolemia, unspecified: Secondary | ICD-10-CM | POA: Diagnosis not present

## 2023-12-31 DIAGNOSIS — E559 Vitamin D deficiency, unspecified: Secondary | ICD-10-CM

## 2023-12-31 DIAGNOSIS — R7303 Prediabetes: Secondary | ICD-10-CM | POA: Insufficient documentation

## 2023-12-31 DIAGNOSIS — Z0000 Encounter for general adult medical examination without abnormal findings: Secondary | ICD-10-CM | POA: Diagnosis not present

## 2023-12-31 DIAGNOSIS — F331 Major depressive disorder, recurrent, moderate: Secondary | ICD-10-CM | POA: Diagnosis not present

## 2023-12-31 LAB — POCT GLYCOSYLATED HEMOGLOBIN (HGB A1C): Hemoglobin A1C: 5.6 % (ref 4.0–5.6)

## 2023-12-31 NOTE — Assessment & Plan Note (Signed)
 Repleted with Vit D OTC.

## 2023-12-31 NOTE — Assessment & Plan Note (Signed)
Stable, chronic.  Continue current medication..  Was unable to wean without irritability recurring.   Pristiq 100 mg XL daily.

## 2023-12-31 NOTE — Assessment & Plan Note (Signed)
 Low 10-year CVD risk score, but significant family history and worsening cholesterol.    calcium CT score t 0 in 2024.

## 2023-12-31 NOTE — Progress Notes (Signed)
 Patient ID: CORDARREL REGALBUTO, male    DOB: January 18, 1976, 48 y.o.   MRN: [id number]  This visit was conducted in person.  BP 110/70   Pulse 75   Temp 97.8 F (36.6 C) (Temporal)   Ht 5' 6.25" (1.683 m)   Wt 233 lb 4 oz (105.8 kg)   SpO2 97%   BMI 37.36 kg/m    CC:  Chief Complaint  Patient presents with   Annual Exam    Subjective:   HPI: JILL ADEN is a 48 y.o. male presenting on 12/31/2023 for Annual Exam  Elevated Cholesterol: Cholesterol not at goal but low 10-year ASCVD risk score  calcium CT score t 0 in 2024. Lab Results  Component Value Date   CHOL 258 (H) 12/17/2023   HDL 47.90 12/17/2023   LDLCALC 186 (H) 12/17/2023   LDLDIRECT 147.8 05/03/2013   TRIG 119.0 12/17/2023   CHOLHDL 5 12/17/2023  Using medications without problems: Muscle aches:  Diet compliance: moderate Exercise:  walking dog 2 miles 2 times a week Other complaints: The 10-year ASCVD risk score (Arnett DK, et al., 2019) is: 2.5%   Values used to calculate the score:     Age: 76 years     Sex: Male     Is Non-Hispanic African American: No     Diabetic: No     Tobacco smoker: No     Systolic Blood Pressure: 110 mmHg     Is BP treated: No     HDL Cholesterol: 47.9 mg/dL     Total Cholesterol: 258 mg/dL   Family history CAD  in MGF.  High cholesterol in MOM, Dad.  Situation anxiety/MDD: well  controlled on Pristiq  100 mg daily.. was not able to wean off. Flowsheet Row Office Visit from 12/31/2023 in [facility]  PHQ-2 Total Score 2      Vit D again low despite 1000 IU daily   OSA: CPAP followed by pulmonary.  Weight management no longer on semaglutide   He has started back on healthy eating Wt Readings from Last 3 Encounters:  12/31/23 233 lb 4 oz (105.8 kg)  06/18/23 226 lb 4 oz (102.6 kg)  11/24/22 209 lb 6.4 oz (95 kg)     Wt Readings from Last 3 Encounters:  12/31/23 233 lb 4 oz (105.8 kg)  06/18/23 226 lb 4 oz (102.6 kg)  11/24/22  209 lb 6.4 oz (95 kg)        Relevant past medical, surgical, family and social history reviewed and updated as indicated. Interim medical history since our last visit reviewed. Allergies and medications reviewed and updated. Outpatient Medications Prior to Visit  Medication Sig Dispense Refill   acetaminophen  (TYLENOL ) 500 MG tablet Take 500 mg by mouth every 6 (six) hours as needed.     Azelastine HCl (ASTEPRO) 0.15 % SOLN Place 2 sprays into the nose daily as needed.     Cholecalciferol (VITAMIN D -3) 125 MCG (5000 UT) TABS Take 1 tablet by mouth daily.     Continuous Blood Gluc Receiver (FREESTYLE LIBRE 2 READER) DEVI Use to check blood sugar continuous as instructed. 1 each 0   Continuous Blood Gluc Transmit (DEXCOM G6 TRANSMITTER) MISC Check blood sugar as recommended. 1 each 0   desvenlafaxine  (PRISTIQ ) 100 MG 24 hr tablet Take 1 tablet (100 mg total) by mouth daily. 90 tablet 0   fexofenadine (ALLEGRA) 180 MG tablet Take 180 mg by mouth daily as needed for  allergies.     fluticasone  (FLONASE ) 50 MCG/ACT nasal spray USE 2 SPRAYS IN EACH NOSTIL ONCE A DAY 48 g 3   glucose blood (FREESTYLE LITE) test strip Use as directed to check blood glucose. 100 strip 3   glucose monitoring kit (FREESTYLE) monitoring kit 1 each by Does not apply route as needed for other. 1 each 0   ibuprofen (ADVIL,MOTRIN) 600 MG tablet Take 400 mg by mouth 2 (two) times daily.     indapamide  (LOZOL ) 2.5 MG tablet Take 1 tablet (2.5 mg total) by mouth daily. 90 tablet 3   Loteprednol  Etabonate (LOTEMAX  SM) 0.38 % GEL Place 1 drop into affected eye 3 times a day 5 g 0   MAGNESIUM GLUCONATE PO Take 400 mg by mouth daily.     methocarbamol  (ROBAXIN ) 500 MG tablet Take 1 tablet by mouth 4 times a day for 5 days. 20 tablet 0   propranolol  (INDERAL ) 10 MG tablet TAKE 1 TABLET BY MOUTH ONCE DAILY AS NEEDED 90 tablet 1   pseudoephedrine (SUDAFED) 60 MG tablet Take 60 mg by mouth every 4 (four) hours as needed for  congestion.     tamsulosin  (FLOMAX ) 0.4 MG CAPS capsule Take 1 capsule (0.4 mg total) by mouth daily. Take as needed for stone episodes. 30 capsule 2   Turmeric 450 MG CAPS Take 450 mg by mouth daily.     cholecalciferol (VITAMIN D ) 25 MCG (1000 UNIT) tablet Take 2,000 Units by mouth daily.     ipratropium (ATROVENT ) 0.03 % nasal spray Place 2 sprays into both nostrils every 12 (twelve) hours as needed for rhinitis.     No facility-administered medications prior to visit.     Per HPI unless specifically indicated in ROS section below Review of Systems  Constitutional:  Negative for fatigue and fever.  HENT:  Negative for ear pain.   Eyes:  Negative for pain.  Respiratory:  Negative for cough and shortness of breath.   Cardiovascular:  Negative for chest pain, palpitations and leg swelling.  Gastrointestinal:  Negative for abdominal pain.  Genitourinary:  Negative for dysuria.  Musculoskeletal:  Negative for arthralgias.  Neurological:  Negative for syncope, light-headedness and headaches.  Psychiatric/Behavioral:  Negative for dysphoric mood.    Objective:  BP 110/70   Pulse 75   Temp 97.8 F (36.6 C) (Temporal)   Ht 5' 6.25" (1.683 m)   Wt 233 lb 4 oz (105.8 kg)   SpO2 97%   BMI 37.36 kg/m   Wt Readings from Last 3 Encounters:  12/31/23 233 lb 4 oz (105.8 kg)  06/18/23 226 lb 4 oz (102.6 kg)  11/24/22 209 lb 6.4 oz (95 kg)      Physical Exam Constitutional:      Appearance: He is well-developed.  HENT:     Head: Normocephalic.     Right Ear: Hearing normal.     Left Ear: Hearing normal.     Nose: Nose normal.  Neck:     Thyroid : No thyroid  mass or thyromegaly.     Vascular: No carotid bruit.     Trachea: Trachea normal.  Cardiovascular:     Rate and Rhythm: Normal rate and regular rhythm.     Pulses: Normal pulses.     Heart sounds: Heart sounds not distant. No murmur heard.    No friction rub. No gallop.     Comments: No peripheral edema Pulmonary:      Effort: Pulmonary effort is normal. No respiratory distress.  Breath sounds: Normal breath sounds.  Skin:    General: Skin is warm and dry.     Findings: No rash.  Psychiatric:        Speech: Speech normal.        Behavior: Behavior normal.        Thought Content: Thought content normal.       Results for orders placed or performed in visit on 12/17/23  VITAMIN D  25 Hydroxy (Vit-D Deficiency, Fractures)   Collection Time: 12/17/23  7:41 AM  Result Value Ref Range   VITD 33.28 30.00 - 100.00 ng/mL  PSA   Collection Time: 12/17/23  7:41 AM  Result Value Ref Range   PSA 0.48 0.10 - 4.00 ng/mL  Lipid panel   Collection Time: 12/17/23  7:41 AM  Result Value Ref Range   Cholesterol 258 (H) 0 - 200 mg/dL   Triglycerides 161.0 0.0 - 149.0 mg/dL   HDL 96.04 >54.09 mg/dL   VLDL 81.1 0.0 - 91.4 mg/dL   LDL Cholesterol 782 (H) 0 - 99 mg/dL   Total CHOL/HDL Ratio 5    NonHDL 209.87   Comprehensive metabolic panel   Collection Time: 12/17/23  7:41 AM  Result Value Ref Range   Sodium 141 135 - 145 mEq/L   Potassium 4.1 3.5 - 5.1 mEq/L   Chloride 99 96 - 112 mEq/L   CO2 34 (H) 19 - 32 mEq/L   Glucose, Bld 102 (H) 70 - 99 mg/dL   BUN 17 6 - 23 mg/dL   Creatinine, Ser 9.56 0.40 - 1.50 mg/dL   Total Bilirubin 0.5 0.2 - 1.2 mg/dL   Alkaline Phosphatase 38 (L) 39 - 117 U/L   AST 26 0 - 37 U/L   ALT 45 0 - 53 U/L   Total Protein 6.8 6.0 - 8.3 g/dL   Albumin 4.4 3.5 - 5.2 g/dL   GFR 213.08 >65.78 mL/min   Calcium 9.8 8.4 - 10.5 mg/dL    This visit occurred during the SARS-CoV-2 public health emergency.  Safety protocols were in place, including screening questions prior to the visit, additional usage of staff PPE, and extensive cleaning of exam room while observing appropriate contact time as indicated for disinfecting solutions.   COVID 19 screen:  No recent travel or known exposure to COVID19 The patient denies respiratory symptoms of COVID 19 at this time. The importance of  social distancing was discussed today.   Assessment and Plan   The patient's preventative maintenance and recommended screening tests for an annual wellness exam were reviewed in full today. Brought up to date unless services declined.  Counselled on the importance of diet, exercise, and its role in overall health and mortality. The patient's FH and SH was reviewed, including their home life, tobacco status, and drug and alcohol status.   Vaccines: Uptodate Tdap and flu, has completed three COVID vaccine No family history of  first degree prostate cancer.    MGF prostate cancer .   Lab Results  Component Value Date   PSA 0.48 12/17/2023   PSA 0.48 10/09/2022   PSA 0.42 10/01/2021   Mother with mutiple polyps, MGM colon cancer, uncle large polyp. No symptoms. Colonoscopy:2020 precancerous polyps.. repeat in 5 years. Dr. Bridgett Camps. No desire for STD screen.   No smoking   Problem List Items Addressed This Visit     High cholesterol   Low 10-year CVD risk score, but significant family history and worsening cholesterol.    calcium CT score  t 0 in 2024.      Major depressive disorder, recurrent, moderate (HCC)   Stable, chronic.  Continue current medication..  Was unable to wean without irritability recurring.   Pristiq  100 mg XL daily.      Prediabetes   Relevant Orders   HgB A1c   Other Visit Diagnoses       Routine general medical examination at a health care facility    -  Primary       Herby Lolling, MD

## 2024-01-30 NOTE — Progress Notes (Signed)
 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 Good compliance. Download reviewed.  He notices an occasional pressure sore inside his nostrils from the nasal pillows.  We discussed management and mask alternatives. He uses Flonase  for allergic rhinitis.  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  History of Present Illness   Aaron Malone is a 48 year old male with sleep apnea who presents for a follow-up on his CPAP therapy.  He is comfortable with his CPAP machine, which operates between a pressure range of five to twenty, primarily at five to six. He experiences less than one breakthrough apnea per hour, which is within the acceptable range. Pressure does not get too high. He has no breathing troubles or concerns related to his sleep apnea management.     Assessment and :    Sleep apnea Sleep apnea well-managed with CPAP therapy. CPAP operates effectively at 5-6 pressure range. Less than one apnea per hour. Comfortable with ResMed 11  machine. - Continue CPAP therapy with ResMed 11. - Ensure [employer] provides necessary supplies.      ROS-see HPI   + = positive Constitutional:   + weight loss, night sweats, fevers, chills, fatigue, lassitude. HEENT:    +headaches, difficulty swallowing, tooth/dental problems, sore throat,       sneezing, itching, ear ache, +nasal congestion, post nasal drip, snoring CV:    chest pain, orthopnea, PND, swelling in lower extremities, anasarca,                                                        dizziness, palpitations Resp:   shortness of breath with exertion or at rest.                productive cough,   non-productive cough, coughing up of blood.              change in color of mucus.  wheezing.   Skin:    rash or lesions. GI:  No-   heartburn, indigestion, abdominal pain, nausea, vomiting, diarrhea,                 change in bowel habits, loss of appetite GU: dysuria, change in color of urine, no urgency or frequency.   flank pain. MS:   joint pain, stiffness, decreased range of motion, back pain. Neuro-     nothing unusual Psych:  change in mood or affect.  +depression or anxiety.   memory loss.  OBJ- Physical Exam General- Alert, Oriented, Affect-appropriate, Distress- none acute,  + overweight Skin- rash-none, lesions- none, excoriation-  none Lymphadenopathy- none Head- atraumatic            Eyes- Gross vision intact, PERRLA, conjunctivae and secretions clear            Ears- Hearing, canals-normal            Nose- Clear, no-Septal dev, mucus, polyps, erosion, perforation             Throat- Mallampati III , mucosa clear , drainage- none, tonsils- atrophic Neck- flexible , trachea midline, no stridor , thyroid  nl, carotid no bruit Chest - symmetrical excursion , unlabored           Heart/CV- RRR , no murmur , no gallop  , no rub, nl s1 s2                           - JVD- none , edema- none, stasis changes- none, varices- none           Lung- clear to P&A, wheeze- none,  cough- none , dullness-none, rub- none           Chest wall-  Abd-  Br/ Gen/ Rectal- Not done, not indicated Extrem- cyanosis- none, clubbing, none, atrophy- none, strength- nl Neuro- grossly intact to observation

## 2024-02-01 ENCOUNTER — Encounter: Payer: Self-pay | Admitting: Internal Medicine

## 2024-02-01 ENCOUNTER — Ambulatory Visit (INDEPENDENT_AMBULATORY_CARE_PROVIDER_SITE_OTHER): Admitting: Internal Medicine

## 2024-02-01 VITALS — BP 121/78 | HR 88 | Temp 98.0°F | Resp 18 | Ht 66.0 in | Wt 238.4 lb

## 2024-02-01 DIAGNOSIS — G4730 Sleep apnea, unspecified: Secondary | ICD-10-CM | POA: Diagnosis not present

## 2024-02-01 DIAGNOSIS — G4733 Obstructive sleep apnea (adult) (pediatric): Secondary | ICD-10-CM

## 2024-02-01 NOTE — Patient Instructions (Signed)
 Glad you are doing well. We can continue CPAP auto 5-20. Please let us  know if we can help.

## 2024-03-06 DIAGNOSIS — G4733 Obstructive sleep apnea (adult) (pediatric): Secondary | ICD-10-CM | POA: Diagnosis not present

## 2024-03-14 ENCOUNTER — Ambulatory Visit
Admission: RE | Admit: 2024-03-14 | Discharge: 2024-03-14 | Disposition: A | Discharge status: Home / Self Care | Source: Ambulatory Visit | Attending: Emergency Medicine | Admitting: Emergency Medicine

## 2024-03-14 ENCOUNTER — Other Ambulatory Visit: Payer: Self-pay

## 2024-03-14 VITALS — BP 148/92 | HR 76 | Temp 97.9°F | Resp 19

## 2024-03-14 DIAGNOSIS — H60392 Other infective otitis externa, left ear: Secondary | ICD-10-CM

## 2024-03-14 MED ORDER — CIPROFLOXACIN-DEXAMETHASONE 0.3-0.1 % OT SUSP
4.0000 [drp] | Freq: Two times a day (BID) | OTIC | 0 refills | Status: DC
  Filled 2024-03-14: qty 7.5, 19d supply, fill #0

## 2024-03-14 NOTE — ED Triage Notes (Signed)
 Patient reports complains of ear fullness and pain in left ear x 2 days. Patient tried flushed it. Patient reports flushing gave some relief. Patient reported that pain  and fullness returned this morning. Reports aching pain and rates pain 2/10.

## 2024-03-14 NOTE — ED Provider Notes (Signed)
 [name]    CSN: [id number] Arrival date & time: 03/14/24  1555      History   Chief Complaint Chief Complaint  Patient presents with   Ear Fullness    Ear full. I thought impacted and flushed it a few times. Somewhat better but concerned may be otitis externa - Entered by patient   Otalgia    HPI Aaron Malone is a 48 y.o. male.   Patient presents for evaluation of left-sided ear fullness with decreased hearing, mild discomfort occurring intermittently and the sensation of fluid within the ear beginning 1 day ago.  Symptoms exacerbated by chewing.  Endorses over the last 10 to 15 years he has had reoccurring wax impaction requiring irrigation.  Typically symptoms flared during the summer after swimming, recent vacation.  Has attempted use of water irrigation with elephant ear using peroxide and warm water and dulcolax.  Denies fever, nasal congestion or ear drainage.  Past Medical History:  Diagnosis Date   Carpal tunnel syndrome    bilateral   COVID-19 08/2020   Gallstones 2012   History of kidney stones    Hyperlipidemia    Major depressive disorder, single episode, moderate (HCC)    Obesity    Sleep apnea    uses a CPAP    Patient Active Problem List   Diagnosis Date Noted   Prediabetes 12/31/2023   Elevated transaminase level 04/29/2021   Fatty liver 04/29/2021   Obesity (BMI 30.0-34.9) 03/04/2021   Vitamin D  deficiency 04/22/2020   Situational anxiety 07/31/2016   Nephrolithiasis 07/31/2016   Obstructive sleep apnea 07/29/2016   Pulmonary granuloma (HCC) 11/08/2010   Major depressive disorder, recurrent, moderate (HCC) 06/13/2010   High cholesterol 05/30/2010   Seasonal and perennial allergic rhinitis 11/23/2008   NEPHROLITHIASIS, HX OF 06/15/2007    Past Surgical History:  Procedure Laterality Date   CARPAL TUNNEL RELEASE Bilateral 08/24/2014   Procedure: RIGHT LIMITED OPEN CARPAL TUNNEL RELEASE, LEFT CARPAL  TUNNEL INJECTION;  Surgeon:  Elsie Mussel, MD;  Location: [facility];  Service: Orthopedics;  Laterality: Bilateral;   CARPAL TUNNEL RELEASE Left 06/20/2015   Procedure: LEFT CARPAL TUNNEL RELEASE;  Surgeon: Elsie Mussel, MD;  Location: [facility];  Service: Orthopedics;  Laterality: Left;   CHOLECYSTECTOMY  2012   COLONOSCOPY     CYSTOSCOPY W/ RETROGRADES Bilateral 12/16/2020   Procedure: CYSTOSCOPY WITH RETROGRADE PYELOGRAM;  Surgeon: Penne Knee, MD;  Location: [facility];  Service: Urology;  Laterality: Bilateral;   CYSTOSCOPY/URETEROSCOPY/HOLMIUM LASER/STENT PLACEMENT Right 12/16/2020   Procedure: CYSTOSCOPY/URETEROSCOPY/HOLMIUM LASER/STENT PLACEMENT;  Surgeon: Penne Knee, MD;  Location: [facility];  Service: Urology;  Laterality: Right;   EXTRACORPOREAL SHOCK WAVE LITHOTRIPSY Right 10/31/2020   Procedure: EXTRACORPOREAL SHOCK WAVE LITHOTRIPSY (ESWL);  Surgeon: Penne Knee, MD;  Location: [facility];  Service: Urology;  Laterality: Right;   INGUINAL HERNIA REPAIR Bilateral 1983   LITHOTRIPSY  12/2003, 09/2006   TRIGGER FINGER RELEASE     thumb       Home Medications    Prior to Admission medications   Medication Sig Start Date End Date Taking? Authorizing Provider  ciprofloxacin -dexamethasone  (CIPRODEX ) OTIC suspension Place 4 drops into the left ear 2 (two) times daily. 03/14/24  Yes Chanze Teagle R, NP  acetaminophen  (TYLENOL ) 500 MG tablet Take 500 mg by mouth every 6 (six) hours as needed.    [provider]  Azelastine HCl (ASTEPRO) 0.15 % SOLN Place 2 sprays into the nose daily as needed.  [provider]  Cholecalciferol (VITAMIN D -3) 125 MCG (5000 UT) TABS Take 1 tablet by mouth daily.    [provider]  Continuous Blood Gluc Receiver (FREESTYLE LIBRE 2 READER) DEVI Use to check blood sugar continuous as instructed. 06/07/20   Bedsole, Amy E, MD  Continuous Blood Gluc Transmit (DEXCOM G6 TRANSMITTER) MISC Check blood sugar as recommended.  12/08/19   Bedsole, Amy E, MD  desvenlafaxine  (PRISTIQ ) 100 MG 24 hr tablet Take 1 tablet (100 mg total) by mouth daily. 12/13/23   Bedsole, Amy E, MD  fexofenadine (ALLEGRA) 180 MG tablet Take 180 mg by mouth daily as needed for allergies. 09/29/21   [provider]  fluticasone  (FLONASE ) 50 MCG/ACT nasal spray USE 2 SPRAYS IN EACH NOSTIL ONCE A DAY 11/11/20   Bedsole, Amy E, MD  glucose blood (FREESTYLE LITE) test strip Use as directed to check blood glucose. 07/07/21   Bedsole, Amy E, MD  glucose monitoring kit (FREESTYLE) monitoring kit 1 each by Does not apply route as needed for other. 12/12/19   Bedsole, Amy E, MD  ibuprofen (ADVIL,MOTRIN) 600 MG tablet Take 400 mg by mouth 2 (two) times daily.    [provider]  indapamide  (LOZOL ) 2.5 MG tablet Take 1 tablet (2.5 mg total) by mouth daily. 04/20/23   Vaillancourt, Samantha, PA-C  Loteprednol  Etabonate (LOTEMAX  SM) 0.38 % GEL Place 1 drop into affected eye 3 times a day 08/05/23     MAGNESIUM GLUCONATE PO Take 400 mg by mouth daily.    [provider]  methocarbamol  (ROBAXIN ) 500 MG tablet Take 1 tablet by mouth 4 times a day for 5 days. 08/11/22     propranolol  (INDERAL ) 10 MG tablet TAKE 1 TABLET BY MOUTH ONCE DAILY AS NEEDED 12/02/23 12/01/24  Bedsole, Amy E, MD  pseudoephedrine (SUDAFED) 60 MG tablet Take 60 mg by mouth every 4 (four) hours as needed for congestion.    [provider]  tamsulosin  (FLOMAX ) 0.4 MG CAPS capsule Take 1 capsule (0.4 mg total) by mouth daily. Take as needed for stone episodes. 06/18/23   Vaillancourt, Samantha, PA-C  Turmeric 450 MG CAPS Take 450 mg by mouth daily.    [provider]    Family History Family History  Problem Relation Age of Onset   Hyperlipidemia Mother    Hypertension Mother    Irritable bowel syndrome Mother    Colon polyps Mother        x lots   Polycystic ovary syndrome Sister    Colon cancer Maternal Grandmother        dx in her 24's or 12's    Lung cancer Maternal Grandmother    Heart failure Maternal Grandfather    Heart disease Maternal Grandfather    Prostate cancer Maternal Grandfather    Multiple myeloma Paternal Grandfather    Diabetes Paternal Grandmother    Colon polyps Maternal Uncle     Social History Social History   Tobacco Use   Smoking status: Never   Smokeless tobacco: Never  Vaping Use   Vaping status: Never Used  Substance Use Topics   Alcohol use: Yes    Comment: occasionally   Drug use: No     Allergies   Patient has no known allergies.   Review of Systems Review of Systems   Physical Exam Triage Vital Signs ED Triage Vitals [03/14/24 1635]  Encounter Vitals Group     BP      Girls Systolic BP Percentile  Girls Diastolic BP Percentile      Boys Systolic BP Percentile      Boys Diastolic BP Percentile      Pulse      Resp      Temp      Temp src      SpO2      Weight      Height      Head Circumference      Peak Flow      Pain Score 2     Pain Loc      Pain Education      Exclude from Growth Chart    No data found.  Updated Vital Signs There were no vitals taken for this visit.  Visual Acuity Right Eye Distance:   Left Eye Distance:   Bilateral Distance:    Right Eye Near:   Left Eye Near:    Bilateral Near:     Physical Exam Constitutional:      Appearance: Normal appearance.  HENT:     Right Ear: Tympanic membrane, ear canal and external ear normal.     Ears:     Comments: Severe swelling and erythema present to the left ear canal, no abnormality to the tympanic membrane, no drainage present,  Eyes:     Extraocular Movements: Extraocular movements intact.  Pulmonary:     Effort: Pulmonary effort is normal.  Neurological:     Mental Status: He is alert and oriented to person, place, and time.      UC Treatments / Results  Labs (all labs ordered are listed, but only abnormal results are displayed) Labs Reviewed - No data to  display  EKG   Radiology No results found.  Procedures Procedures (including critical care time)  Medications Ordered in UC Medications - No data to display  Initial Impression / Assessment and Plan / UC Course  I have reviewed the triage vital signs and the nursing notes.  Pertinent labs & imaging results that were available during my care of the patient were reviewed by me and considered in my medical decision making (see chart for details).  Infective otitis externa of left ear  presentation of the canal is consistent with infection, discussed this with patient, prescribed Ciprodex  and discussed administration, recommended over-the-counter medications and nonpharmacological supportive care with follow-up as needed Final Clinical Impressions(s) / UC Diagnoses   Final diagnoses:  Infective otitis externa of left ear     Discharge Instructions      Today you are being treated for an infection of the eaR canal, on exam there is redness and swelling but no drainage or wax present  Place Ciprodex  into the left ear every morning and every evening for 7 days, this is a mixture of antibiotic and a steroid, you should begin to see improvement after 48 hours of medication use and then it should progressively get better  You may use Tylenol  or ibuprofen for management of discomfort  May hold warm compresses to the ear for additional comfort  Please not attempted any ear cleaning or object or fluid placement into the ear canal to prevent further irritation  If symptoms continue to persist or worsen you may follow-up with urgent care or primary doctor for reevaluation, may also follow-up with ear nose and throat specialist whose information is on from page    ED Prescriptions     Medication Sig Dispense Auth. Provider   ciprofloxacin -dexamethasone  (CIPRODEX ) OTIC suspension Place 4 drops into the left  ear 2 (two) times daily. 7.5 mL Teresa Shelba SAUNDERS, NP      PDMP not  reviewed this encounter.   Teresa Shelba SAUNDERS, NP 03/14/24 1719

## 2024-03-14 NOTE — Discharge Instructions (Addendum)
 Today you are being treated for an infection of the eaR canal, on exam there is redness and swelling but no drainage or wax present  Place Ciprodex  into the left ear every morning and every evening for 7 days, this is a mixture of antibiotic and a steroid, you should begin to see improvement after 48 hours of medication use and then it should progressively get better  You may use Tylenol  or ibuprofen for management of discomfort  May hold warm compresses to the ear for additional comfort  Please not attempted any ear cleaning or object or fluid placement into the ear canal to prevent further irritation  If symptoms continue to persist or worsen you may follow-up with urgent care or primary doctor for reevaluation, may also follow-up with ear nose and throat specialist whose information is on from page

## 2024-03-16 ENCOUNTER — Other Ambulatory Visit (HOSPITAL_COMMUNITY): Payer: Self-pay

## 2024-03-16 ENCOUNTER — Other Ambulatory Visit: Payer: Self-pay | Admitting: Family Medicine

## 2024-03-16 MED ORDER — DESVENLAFAXINE SUCCINATE ER 100 MG PO TB24
100.0000 mg | ORAL_TABLET | Freq: Every day | ORAL | 1 refills | Status: DC
  Filled 2024-03-16: qty 90, 90d supply, fill #0
  Filled 2024-06-14: qty 90, 90d supply, fill #1

## 2024-03-17 ENCOUNTER — Other Ambulatory Visit: Payer: Self-pay

## 2024-03-17 ENCOUNTER — Ambulatory Visit: Admitting: Family Medicine

## 2024-03-17 VITALS — BP 108/80 | HR 61 | Temp 97.3°F | Ht 66.25 in | Wt 241.4 lb

## 2024-03-17 DIAGNOSIS — H9202 Otalgia, left ear: Secondary | ICD-10-CM | POA: Insufficient documentation

## 2024-03-17 MED ORDER — AMOXICILLIN 500 MG PO CAPS
1000.0000 mg | ORAL_CAPSULE | Freq: Two times a day (BID) | ORAL | 0 refills | Status: DC
  Filled 2024-03-17: qty 40, 10d supply, fill #0

## 2024-03-17 NOTE — Assessment & Plan Note (Signed)
 Acute, continued evidence of external otitis media, focal swelling and redness on the superior wall of ear canal.  Swelling in ear canal obstructs full view of TM but there are some concerns of exudate/effusion and bulging of the eardrum. Given he has made minimal improvement with Ciprodex  I will have him continue this but add a course of oral antibiotics to cover for otitis media. Amoxicillin  500 mg 2 capsules twice daily x 10 days. Ciprodex  otic 4 drops twice daily Also start Flonase  2 sprays per nostril daily to help eustachian tube decrease swelling.  He will call if his symptoms are not improving as expected.

## 2024-03-17 NOTE — Progress Notes (Signed)
 Patient ID: Aaron Malone, male    DOB: 09/26/1975, 48 y.o.   MRN: [id number]  This visit was conducted in person.  BP 108/80   Pulse 61   Temp (!) 97.3 F (36.3 C) (Temporal)   Ht 5' 6.25 (1.683 m)   Wt 241 lb 6 oz (109.5 kg)   SpO2 97%   BMI 38.67 kg/m    CC:  Chief Complaint  Patient presents with   Otitis Externa    Left-Follow Up Urgent Care 03/14/2024    Subjective:   HPI: Aaron Malone is a 48 y.o. male presenting on 03/17/2024 for Otitis Externa (Left-Follow Up Urgent Care 03/14/2024)   Recent Urgent Care OV on 7/29  Dx otitis externa  Treated with ciprodex  otic... 6-7 dose so far   He reports minimal improvement.  Continues to have left ear pain, moving external ear hurt. Decreased hearing.  Using ibuprofen for pain  Minimal discharge.   Prior to issue swimming, used ear wash irrigation device.   No nasal congestion,  no ST, no sinus pressure, no SOB. no fever      Relevant past medical, surgical, family and social history reviewed and updated as indicated. Interim medical history since our last visit reviewed. Allergies and medications reviewed and updated. Outpatient Medications Prior to Visit  Medication Sig Dispense Refill   acetaminophen  (TYLENOL ) 500 MG tablet Take 500 mg by mouth every 6 (six) hours as needed.     Azelastine HCl (ASTEPRO) 0.15 % SOLN Place 2 sprays into the nose daily as needed.     Cholecalciferol (VITAMIN D -3) 125 MCG (5000 UT) TABS Take 1 tablet by mouth daily.     ciprofloxacin -dexamethasone  (CIPRODEX ) OTIC suspension Place 4 drops into the left ear 2 (two) times daily. 7.5 mL 0   Continuous Blood Gluc Receiver (FREESTYLE LIBRE 2 READER) DEVI Use to check blood sugar continuous as instructed. 1 each 0   Continuous Blood Gluc Transmit (DEXCOM G6 TRANSMITTER) MISC Check blood sugar as recommended. 1 each 0   desvenlafaxine  (PRISTIQ ) 100 MG 24 hr tablet Take 1 tablet (100 mg total) by mouth daily. 90 tablet 1   fexofenadine  (ALLEGRA) 180 MG tablet Take 180 mg by mouth daily as needed for allergies.     fluticasone  (FLONASE ) 50 MCG/ACT nasal spray USE 2 SPRAYS IN EACH NOSTIL ONCE A DAY 48 g 3   glucose blood (FREESTYLE LITE) test strip Use as directed to check blood glucose. 100 strip 3   glucose monitoring kit (FREESTYLE) monitoring kit 1 each by Does not apply route as needed for other. 1 each 0   ibuprofen (ADVIL,MOTRIN) 600 MG tablet Take 400 mg by mouth 2 (two) times daily.     indapamide  (LOZOL ) 2.5 MG tablet Take 1 tablet (2.5 mg total) by mouth daily. 90 tablet 3   Loteprednol  Etabonate (LOTEMAX  SM) 0.38 % GEL Place 1 drop into affected eye 3 times a day 5 g 0   MAGNESIUM GLUCONATE PO Take 400 mg by mouth daily.     methocarbamol  (ROBAXIN ) 500 MG tablet Take 1 tablet by mouth 4 times a day for 5 days. 20 tablet 0   propranolol  (INDERAL ) 10 MG tablet TAKE 1 TABLET BY MOUTH ONCE DAILY AS NEEDED 90 tablet 1   pseudoephedrine (SUDAFED) 60 MG tablet Take 60 mg by mouth every 4 (four) hours as needed for congestion.     tamsulosin  (FLOMAX ) 0.4 MG CAPS capsule Take 1 capsule (0.4 mg total) by  mouth daily. Take as needed for stone episodes. 30 capsule 2   Turmeric 450 MG CAPS Take 450 mg by mouth daily.     No facility-administered medications prior to visit.     Per HPI unless specifically indicated in ROS section below Review of Systems  Constitutional:  Negative for fatigue and fever.  HENT:  Positive for ear pain.   Eyes:  Negative for pain.  Respiratory:  Negative for cough and shortness of breath.   Cardiovascular:  Negative for chest pain, palpitations and leg swelling.  Gastrointestinal:  Negative for abdominal pain.  Genitourinary:  Negative for dysuria.  Musculoskeletal:  Negative for arthralgias.  Neurological:  Negative for syncope, light-headedness and headaches.  Psychiatric/Behavioral:  Negative for dysphoric mood.    Objective:  BP 108/80   Pulse 61   Temp (!) 97.3 F (36.3 C) (Temporal)    Ht 5' 6.25 (1.683 m)   Wt 241 lb 6 oz (109.5 kg)   SpO2 97%   BMI 38.67 kg/m   Wt Readings from Last 3 Encounters:  03/17/24 241 lb 6 oz (109.5 kg)  02/01/24 238 lb 6.4 oz (108.1 kg)  12/31/23 233 lb 4 oz (105.8 kg)      Physical Exam Vitals reviewed.  Constitutional:      Appearance: He is well-developed.  HENT:     Head: Normocephalic.     Right Ear: Hearing normal.     Left Ear: Hearing normal. Swelling and tenderness present. No laceration or drainage. A middle ear effusion is present. There is no impacted cerumen. Tympanic membrane is bulging. Tympanic membrane is not injected.     Nose: Nose normal.  Neck:     Thyroid : No thyroid  mass or thyromegaly.     Vascular: No carotid bruit.     Trachea: Trachea normal.  Cardiovascular:     Rate and Rhythm: Normal rate and regular rhythm.     Pulses: Normal pulses.     Heart sounds: Heart sounds not distant. No murmur heard.    No friction rub. No gallop.     Comments: No peripheral edema Pulmonary:     Effort: Pulmonary effort is normal. No respiratory distress.     Breath sounds: Normal breath sounds.  Skin:    General: Skin is warm and dry.     Findings: No rash.  Psychiatric:        Speech: Speech normal.        Behavior: Behavior normal.        Thought Content: Thought content normal.       Results for orders placed or performed in visit on 12/31/23  HgB A1c   Collection Time: 12/31/23  9:20 AM  Result Value Ref Range   Hemoglobin A1C 5.6 4.0 - 5.6 %   HbA1c POC (<> result, manual entry)     HbA1c, POC (prediabetic range)     HbA1c, POC (controlled diabetic range)      Assessment and Plan  Acute ear pain, left Assessment & Plan: Acute, continued evidence of external otitis media, focal swelling and redness on the superior wall of ear canal.  Swelling in ear canal obstructs full view of TM but there are some concerns of exudate/effusion and bulging of the eardrum. Given he has made minimal improvement  with Ciprodex  I will have him continue this but add a course of oral antibiotics to cover for otitis media. Amoxicillin  500 mg 2 capsules twice daily x 10 days. Ciprodex  otic 4 drops twice  daily Also start Flonase  2 sprays per nostril daily to help eustachian tube decrease swelling.  He will call if his symptoms are not improving as expected.   Other orders -     Amoxicillin ; Take 2 capsules (1,000 mg total) by mouth 2 (two) times daily.  Dispense: 40 capsule; Refill: 0    No follow-ups on file.   Greig Ring, MD

## 2024-03-24 ENCOUNTER — Encounter: Payer: Self-pay | Admitting: Family Medicine

## 2024-03-24 ENCOUNTER — Ambulatory Visit: Admitting: Family Medicine

## 2024-03-24 ENCOUNTER — Other Ambulatory Visit: Payer: Self-pay

## 2024-03-24 VITALS — BP 110/80 | HR 79 | Temp 98.7°F | Ht 66.25 in | Wt 236.0 lb

## 2024-03-24 DIAGNOSIS — H938X2 Other specified disorders of left ear: Secondary | ICD-10-CM | POA: Diagnosis not present

## 2024-03-24 MED ORDER — PREDNISONE 20 MG PO TABS
ORAL_TABLET | ORAL | 0 refills | Status: DC
  Filled 2024-03-24: qty 15, 7d supply, fill #0

## 2024-03-24 MED ORDER — AMOXICILLIN-POT CLAVULANATE 875-125 MG PO TABS
1.0000 | ORAL_TABLET | Freq: Two times a day (BID) | ORAL | 0 refills | Status: DC
  Filled 2024-03-24 (×2): qty 20, 10d supply, fill #0

## 2024-03-24 NOTE — Assessment & Plan Note (Signed)
 Acute left otitis externa resolved.  Current continued acute otitis media. Will have him complete amoxicillin  course.  Add prednisone  to assist with eustachian tube dysfunction and drainage of fluid behind the eardrum. If not continuing to improve or if pain recurs broaden antibiotics to Augmentin  875/0.25 mg p.o. twice daily x 10 days.

## 2024-03-24 NOTE — Progress Notes (Signed)
 Patient ID: [name], male    DOB: [birth year], 48 y.o.   MRN: [id number]  This visit was conducted in person.  BP 110/80   Pulse 79   Temp 98.7 F (37.1 C) (Temporal)   Ht 5' 6.25 (1.683 m)   Wt 236 lb (107 kg)   SpO2 98%   BMI 37.80 kg/m    CC:  Chief Complaint  Patient presents with   Hearing Problem    Muffled-Follow up from 03/17/24    Subjective:   HPI: Aaron Malone is a 48 y.o. male presenting on 03/24/2024 for Hearing Problem (Muffled-Follow up from 03/17/24)   Recent Urgent Care OV on 7/29  Dx otitis externa  Treated with ciprodex  otic... 6-7 dose so far    Had continued issues .Aaron Malone up with me on March 17, 2024. At that time he was started on amoxicillin  1000 mg twice daily x 10 days.  Today he reports he has noted no further pain, swelling... now just having muffled hearing. No ear pressure  Has been using flonase  2 times daily.   No fever, no cough, no SOB.      Relevant past medical, surgical, family and social history reviewed and updated as indicated. Interim medical history since our last visit reviewed. Allergies and medications reviewed and updated. Outpatient Medications Prior to Visit  Medication Sig Dispense Refill   acetaminophen  (TYLENOL ) 500 MG tablet Take 500 mg by mouth every 6 (six) hours as needed.     Azelastine HCl (ASTEPRO) 0.15 % SOLN Place 2 sprays into the nose daily as needed.     Cholecalciferol (VITAMIN D -3) 125 MCG (5000 UT) TABS Take 1 tablet by mouth daily.     ciprofloxacin -dexamethasone  (CIPRODEX ) OTIC suspension Place 4 drops into the left ear 2 (two) times daily. 7.5 mL 0   Continuous Blood Gluc Receiver (FREESTYLE LIBRE 2 READER) DEVI Use to check blood sugar continuous as instructed. 1 each 0   Continuous Blood Gluc Transmit (DEXCOM G6 TRANSMITTER) MISC Check blood sugar as recommended. 1 each 0   desvenlafaxine  (PRISTIQ ) 100 MG 24 hr tablet Take 1 tablet (100 mg total) by mouth daily. 90 tablet 1    fexofenadine (ALLEGRA) 180 MG tablet Take 180 mg by mouth daily as needed for allergies.     fluticasone  (FLONASE ) 50 MCG/ACT nasal spray USE 2 SPRAYS IN EACH NOSTIL ONCE A DAY 48 g 3   glucose blood (FREESTYLE LITE) test strip Use as directed to check blood glucose. 100 strip 3   glucose monitoring kit (FREESTYLE) monitoring kit 1 each by Does not apply route as needed for other. 1 each 0   ibuprofen (ADVIL,MOTRIN) 600 MG tablet Take 400 mg by mouth 2 (two) times daily.     indapamide  (LOZOL ) 2.5 MG tablet Take 1 tablet (2.5 mg total) by mouth daily. 90 tablet 3   Loteprednol  Etabonate (LOTEMAX  SM) 0.38 % GEL Place 1 drop into affected eye 3 times a day 5 g 0   MAGNESIUM GLUCONATE PO Take 400 mg by mouth daily.     methocarbamol  (ROBAXIN ) 500 MG tablet Take 1 tablet by mouth 4 times a day for 5 days. 20 tablet 0   propranolol  (INDERAL ) 10 MG tablet TAKE 1 TABLET BY MOUTH ONCE DAILY AS NEEDED 90 tablet 1   pseudoephedrine (SUDAFED) 60 MG tablet Take 60 mg by mouth every 4 (four) hours as needed for congestion.     tamsulosin  (FLOMAX ) 0.4 MG CAPS capsule  Take 1 capsule (0.4 mg total) by mouth daily. Take as needed for stone episodes. 30 capsule 2   Turmeric 450 MG CAPS Take 450 mg by mouth daily.     amoxicillin  (AMOXIL ) 500 MG capsule Take 2 capsules (1,000 mg total) by mouth 2 (two) times daily. 40 capsule 0   No facility-administered medications prior to visit.     Per HPI unless specifically indicated in ROS section below Review of Systems  Constitutional:  Negative for fatigue and fever.  HENT:  Positive for ear pain.   Eyes:  Negative for pain.  Respiratory:  Negative for cough and shortness of breath.   Cardiovascular:  Negative for chest pain, palpitations and leg swelling.  Gastrointestinal:  Negative for abdominal pain.  Genitourinary:  Negative for dysuria.  Musculoskeletal:  Negative for arthralgias.  Neurological:  Negative for syncope, light-headedness and headaches.   Psychiatric/Behavioral:  Negative for dysphoric mood.    Objective:  BP 110/80   Pulse 79   Temp 98.7 F (37.1 C) (Temporal)   Ht 5' 6.25 (1.683 m)   Wt 236 lb (107 kg)   SpO2 98%   BMI 37.80 kg/m   Wt Readings from Last 3 Encounters:  03/24/24 236 lb (107 kg)  03/17/24 241 lb 6 oz (109.5 kg)  02/01/24 238 lb 6.4 oz (108.1 kg)      Physical Exam Vitals reviewed.  Constitutional:      Appearance: He is well-developed.  HENT:     Head: Normocephalic.     Right Ear: Hearing normal.     Left Ear: Hearing normal. No laceration, drainage, swelling or tenderness.  No middle ear effusion. There is no impacted cerumen. Tympanic membrane is injected, erythematous and bulging. Tympanic membrane is not retracted.     Nose: Nose normal.  Neck:     Thyroid : No thyroid  mass or thyromegaly.     Vascular: No carotid bruit.     Trachea: Trachea normal.  Cardiovascular:     Rate and Rhythm: Normal rate and regular rhythm.     Pulses: Normal pulses.     Heart sounds: Heart sounds not distant. No murmur heard.    No friction rub. No gallop.     Comments: No peripheral edema Pulmonary:     Effort: Pulmonary effort is normal. No respiratory distress.     Breath sounds: Normal breath sounds.  Skin:    General: Skin is warm and dry.     Findings: No rash.  Psychiatric:        Speech: Speech normal.        Behavior: Behavior normal.        Thought Content: Thought content normal.       Results for orders placed or performed in visit on 12/31/23  HgB A1c   Collection Time: 12/31/23  9:20 AM  Result Value Ref Range   Hemoglobin A1C 5.6 4.0 - 5.6 %   HbA1c POC (<> result, manual entry)     HbA1c, POC (prediabetic range)     HbA1c, POC (controlled diabetic range)      Assessment and Plan  Ear fullness, left Assessment & Plan: Acute left otitis externa resolved.  Current continued acute otitis media. Will have him complete amoxicillin  course.  Add prednisone  to assist with  eustachian tube dysfunction and drainage of fluid behind the eardrum. If not continuing to improve or if pain recurs broaden antibiotics to Augmentin  875/0.25 mg p.o. twice daily x 10 days.   Other orders -  predniSONE ; 3 tabs by mouth daily x 3 days, then 2 tabs by mouth daily x 2 days then 1 tab by mouth daily x 2 days  Dispense: 15 tablet; Refill: 0 -     Amoxicillin -Pot Clavulanate; Take 1 tablet by mouth 2 (two) times daily. Fill if not resolving with addition of prednsione.  Dispense: 20 tablet; Refill: 0     No follow-ups on file.   Aaron Ring, MD

## 2024-04-17 ENCOUNTER — Other Ambulatory Visit: Payer: Self-pay | Admitting: Physician Assistant

## 2024-04-18 ENCOUNTER — Other Ambulatory Visit (HOSPITAL_COMMUNITY): Payer: Self-pay

## 2024-04-18 MED ORDER — INDAPAMIDE 2.5 MG PO TABS
2.5000 mg | ORAL_TABLET | Freq: Every day | ORAL | 3 refills | Status: DC
  Filled 2024-04-18: qty 90, 90d supply, fill #0

## 2024-04-19 ENCOUNTER — Encounter: Payer: Self-pay | Admitting: *Deleted

## 2024-06-08 ENCOUNTER — Other Ambulatory Visit (HOSPITAL_COMMUNITY): Payer: Self-pay

## 2024-06-08 MED ORDER — FLUZONE 0.5 ML IM SUSY
0.5000 mL | PREFILLED_SYRINGE | Freq: Once | INTRAMUSCULAR | 0 refills | Status: AC
  Filled 2024-06-08: qty 0.5, 1d supply, fill #0

## 2024-06-14 ENCOUNTER — Encounter: Payer: Self-pay | Admitting: Physician Assistant

## 2024-06-14 ENCOUNTER — Ambulatory Visit: Payer: Self-pay | Admitting: Urology

## 2024-06-14 ENCOUNTER — Ambulatory Visit (INDEPENDENT_AMBULATORY_CARE_PROVIDER_SITE_OTHER): Admitting: Physician Assistant

## 2024-06-14 ENCOUNTER — Other Ambulatory Visit (HOSPITAL_COMMUNITY): Payer: Self-pay

## 2024-06-14 ENCOUNTER — Ambulatory Visit
Admission: RE | Admit: 2024-06-14 | Discharge: 2024-06-14 | Disposition: A | Discharge status: Home / Self Care | Source: Ambulatory Visit | Attending: Urology | Admitting: Urology

## 2024-06-14 ENCOUNTER — Ambulatory Visit
Admission: RE | Admit: 2024-06-14 | Discharge: 2024-06-14 | Disposition: A | Discharge status: Home / Self Care | Attending: Physician Assistant | Admitting: Physician Assistant

## 2024-06-14 VITALS — BP 129/80 | HR 77 | Temp 98.3°F | Ht 67.0 in | Wt 220.0 lb

## 2024-06-14 DIAGNOSIS — N200 Calculus of kidney: Secondary | ICD-10-CM

## 2024-06-14 DIAGNOSIS — R399 Unspecified symptoms and signs involving the genitourinary system: Secondary | ICD-10-CM | POA: Diagnosis not present

## 2024-06-14 DIAGNOSIS — N50812 Left testicular pain: Secondary | ICD-10-CM

## 2024-06-14 DIAGNOSIS — I878 Other specified disorders of veins: Secondary | ICD-10-CM | POA: Diagnosis not present

## 2024-06-14 LAB — URINALYSIS, COMPLETE
Bilirubin, UA: NEGATIVE
Glucose, UA: NEGATIVE
Ketones, UA: NEGATIVE
Leukocytes,UA: NEGATIVE
Nitrite, UA: NEGATIVE
Specific Gravity, UA: 1.02 (ref 1.005–1.030)
Urobilinogen, Ur: 0.2 mg/dL (ref 0.2–1.0)
pH, UA: 6 (ref 5.0–7.5)

## 2024-06-14 LAB — MICROSCOPIC EXAMINATION: RBC, Urine: >30 /HPF — AB (ref 0–2)

## 2024-06-14 LAB — BLADDER SCAN AMB NON-IMAGING

## 2024-06-14 MED ORDER — CELECOXIB 100 MG PO CAPS
100.0000 mg | ORAL_CAPSULE | Freq: Two times a day (BID) | ORAL | 3 refills | Status: AC | PRN
  Filled 2024-06-14: qty 28, 14d supply, fill #0

## 2024-06-14 MED ORDER — INDAPAMIDE 2.5 MG PO TABS
2.5000 mg | ORAL_TABLET | Freq: Every day | ORAL | 3 refills | Status: AC
  Filled 2024-06-14 – 2024-07-16 (×2): qty 90, 90d supply, fill #0

## 2024-06-14 NOTE — Progress Notes (Signed)
 06/14/2024 9:36 AM   Aaron Malone 02-11-1976 [id number]  CC: Chief Complaint  Patient presents with   Benign Prostatic Hypertrophy   HPI: Aaron Malone is a 48 y.o. male with PMH recurrent stone disease with hypercalciuria on 2.5 mg indapamide  daily, microscopic hematuria, and LUTS off pharmacotherapy who underwent vasectomy with Dr. Penne in 2022 who presents today for annual follow-up.   Today he reports intermittent left epididymal discomfort and swelling occurring about once monthly for the past 6 to 12 months.  He wonders if this could be associated with his vasectomy.  He denies significant flank pain or gross hematuria to suggest an acute stone episode.  KUB today with stable bilateral renal stones.  IPSS 6/mostly satisfied as below.  PSA in May was stable at 0.48.  In-office UA today positive for 1+ protein and 3+ blood; urine microscopy with >30 RBCs/HPF.  PVR 53 mL.   IPSS     Row Name 06/14/24 0900         International Prostate Symptom Score   How often have you had the sensation of not emptying your bladder? Less than half the time     How often have you had to urinate less than every two hours? Less than half the time     How often have you found it difficult to postpone urination? Not at All     How often have you had a weak urinary stream? Not at All     How often have you had to strain to start urination? Less than 1 in 5 times     How many times did you typically get up at night to urinate? 1 Time     Total IPSS Score 6       Quality of Life due to urinary symptoms   If you were to spend the rest of your life with your urinary condition just the way it is now how would you feel about that? Mostly Satisfied         PMH: Past Medical History:  Diagnosis Date   Carpal tunnel syndrome    bilateral   COVID-19 08/2020   Gallstones 2012   History of kidney stones    Hyperlipidemia    Major depressive disorder, single episode, moderate (HCC)     Obesity    Sleep apnea    uses a CPAP    Surgical History: Past Surgical History:  Procedure Laterality Date   CARPAL TUNNEL RELEASE Bilateral 08/24/2014   Procedure: RIGHT LIMITED OPEN CARPAL TUNNEL RELEASE, LEFT CARPAL  TUNNEL INJECTION;  Surgeon: Elsie Mussel, MD;  Location: [facility];  Service: Orthopedics;  Laterality: Bilateral;   CARPAL TUNNEL RELEASE Left 06/20/2015   Procedure: LEFT CARPAL TUNNEL RELEASE;  Surgeon: Elsie Mussel, MD;  Location: [facility];  Service: Orthopedics;  Laterality: Left;   CHOLECYSTECTOMY  2012   COLONOSCOPY     CYSTOSCOPY W/ RETROGRADES Bilateral 12/16/2020   Procedure: CYSTOSCOPY WITH RETROGRADE PYELOGRAM;  Surgeon: Penne Knee, MD;  Location: [facility];  Service: Urology;  Laterality: Bilateral;   CYSTOSCOPY/URETEROSCOPY/HOLMIUM LASER/STENT PLACEMENT Right 12/16/2020   Procedure: CYSTOSCOPY/URETEROSCOPY/HOLMIUM LASER/STENT PLACEMENT;  Surgeon: Penne Knee, MD;  Location: [facility];  Service: Urology;  Laterality: Right;   EXTRACORPOREAL SHOCK WAVE LITHOTRIPSY Right 10/31/2020   Procedure: EXTRACORPOREAL SHOCK WAVE LITHOTRIPSY (ESWL);  Surgeon: Penne Knee, MD;  Location: [facility];  Service: Urology;  Laterality: Right;   INGUINAL HERNIA REPAIR Bilateral 1983   LITHOTRIPSY  12/2003, 09/2006   TRIGGER FINGER RELEASE     thumb    Home Medications:  Allergies as of 06/14/2024   No Known Allergies      Medication List        Accurate as of June 14, 2024  9:36 AM. If you have any questions, ask your nurse or doctor.          STOP taking these medications    ciprofloxacin -dexamethasone  OTIC suspension Commonly known as: Ciprodex  Stopped by: Lucie Hones       TAKE these medications    acetaminophen  500 MG tablet Commonly known as: TYLENOL  Take 500 mg by mouth every 6 (six) hours as needed.   amoxicillin -clavulanate 875-125 MG tablet Commonly known as: AUGMENTIN  Take 1 tablet by  mouth 2 (two) times daily. Fill if not resolving with addition of prednsione.   Astepro 205.5 MCG/SPRAY Soln Generic drug: Azelastine HCl Place 2 sprays into the nose daily as needed.   celecoxib 100 MG capsule Commonly known as: CeleBREX Take 1 capsule (100 mg total) by mouth 2 (two) times daily as needed for up to 14 days. Started by: Christina Waldrop   desvenlafaxine  100 MG 24 hr tablet Commonly known as: PRISTIQ  Take 1 tablet (100 mg total) by mouth daily.   Dexcom G6 Transmitter Misc Check blood sugar as recommended.   fexofenadine 180 MG tablet Commonly known as: ALLEGRA Take 180 mg by mouth daily as needed for allergies.   fluticasone  50 MCG/ACT nasal spray Commonly known as: FLONASE  USE 2 SPRAYS IN EACH NOSTIL ONCE A DAY   FreeStyle Libre 2 Reader Devi Use to check blood sugar continuous as instructed.   FREESTYLE LITE test strip Generic drug: glucose blood Use as directed to check blood glucose.   glucose monitoring kit monitoring kit 1 each by Does not apply route as needed for other.   ibuprofen 600 MG tablet Commonly known as: ADVIL Take 400 mg by mouth 2 (two) times daily.   indapamide  2.5 MG tablet Commonly known as: LOZOL  Take 1 tablet (2.5 mg total) by mouth daily.   Lotemax  SM 0.38 % Gel Generic drug: Loteprednol  Etabonate Place 1 drop into affected eye 3 times a day   MAGNESIUM GLUCONATE PO Take 400 mg by mouth daily.   methocarbamol  500 MG tablet Commonly known as: ROBAXIN  Take 1 tablet by mouth 4 times a day for 5 days.   predniSONE  20 MG tablet Commonly known as: DELTASONE  3 tabs by mouth daily x 3 days, then 2 tabs by mouth daily x 2 days then 1 tab by mouth daily x 2 days   propranolol  10 MG tablet Commonly known as: INDERAL  TAKE 1 TABLET BY MOUTH ONCE DAILY AS NEEDED   pseudoephedrine 60 MG tablet Commonly known as: SUDAFED Take 60 mg by mouth every 4 (four) hours as needed for congestion.   tamsulosin  0.4 MG Caps  capsule Commonly known as: FLOMAX  Take 1 capsule (0.4 mg total) by mouth daily. Take as needed for stone episodes.   Turmeric 450 MG Caps Take 450 mg by mouth daily.   Vitamin D -3 125 MCG (5000 UT) Tabs Take 1 tablet by mouth daily.        Allergies:  No Known Allergies  Family History: Family History  Problem Relation Age of Onset   Hyperlipidemia Mother    Hypertension Mother    Irritable bowel syndrome Mother    Colon polyps Mother        x lots  Polycystic ovary syndrome Sister    Colon cancer Maternal Grandmother        dx in her 79's or 65's   Lung cancer Maternal Grandmother    Heart failure Maternal Grandfather    Heart disease Maternal Grandfather    Prostate cancer Maternal Grandfather    Multiple myeloma Paternal Grandfather    Diabetes Paternal Grandmother    Colon polyps Maternal Uncle     Social History:   reports that he has never smoked. He has never used smokeless tobacco. He reports current alcohol use. He reports that he does not use drugs.  Physical Exam: BP 129/80   Pulse 77   Ht 5' 7 (1.702 m)   Wt 220 lb (99.8 kg)   BMI 34.46 kg/m   Constitutional:  Alert and oriented, no acute distress, nontoxic appearing HEENT: [place], AT Cardiovascular: No clubbing, cyanosis, or edema Respiratory: Normal respiratory effort, no increased work of breathing GU: Bilateral descended testicles without nodules or masses.  Left epididymis is nodular but nontender.  No significant findings of the right epididymis.  No spermatic cord tenderness or fullness. Skin: No rashes, bruises or suspicious lesions Neurologic: Grossly intact, no focal deficits, moving all 4 extremities Psychiatric: Normal mood and affect  Laboratory Data: Results for orders placed or performed in visit on 06/14/24  Bladder Scan (Post Void Residual) in office   Collection Time: 06/14/24  9:04 AM  Result Value Ref Range   Scan Result 53ml    Assessment & Plan:   1. Nephrolithiasis  (Primary) KUB stable.  No acute symptoms.  UA with micro heme, not atypical for him.  Will continue indapamide . - Urinalysis, Complete - indapamide  (LOZOL ) 2.5 MG tablet; Take 1 tablet (2.5 mg total) by mouth daily.  Dispense: 90 tablet; Refill: 3 - Abdomen 1 view (KUB); Future  2. Lower urinary tract symptoms (LUTS) Emptying appropriately.  PSA stable.  May start Flomax  in the future if his symptoms become more bothersome. - Bladder Scan (Post Void Residual) in office  3. Left testicular pain Occasional left epididymal discomfort, suspect sperm granuloma versus inflammatory epididymitis.  Will send in Celebrex to take as needed.  If this becomes more severe or frequent, will order scrotal ultrasound. - celecoxib (CELEBREX) 100 MG capsule; Take 1 capsule (100 mg total) by mouth 2 (two) times daily as needed for up to 14 days.  Dispense: 28 capsule; Refill: 3   Return in about 1 year (around 06/14/2025) for Annual IPSS/KUB/UA.  Lucie Hones, PA-C  [facility] [place] [address], [address] [place], [place] [address] [phone]

## 2024-06-15 ENCOUNTER — Other Ambulatory Visit (HOSPITAL_COMMUNITY): Payer: Self-pay

## 2024-06-16 ENCOUNTER — Ambulatory Visit

## 2024-06-16 ENCOUNTER — Other Ambulatory Visit: Payer: Self-pay

## 2024-06-16 VITALS — Ht 66.25 in | Wt 247.0 lb

## 2024-06-16 DIAGNOSIS — Z860100 Personal history of colon polyps, unspecified: Secondary | ICD-10-CM

## 2024-06-16 MED ORDER — SUTAB 1479-225-188 MG PO TABS
12.0000 | ORAL_TABLET | ORAL | 0 refills | Status: DC
  Filled 2024-06-16: qty 24, 1d supply, fill #0

## 2024-06-16 NOTE — Progress Notes (Signed)
 No egg or soy allergy known to patient  No issues known to pt with past sedation with any surgeries or procedures Patient denies ever being told they had issues or difficulty with intubation  No FH of Malignant Hyperthermia Pt is not on diet pills Pt is not on  home 02  Pt is not on blood thinners  Pt denies issues with constipation  No A fib or A flutter Have any cardiac testing pending-- no  LOA: independent  Prep: sutab    PV completed with patient. Prep instructions sent via mychart and home address.

## 2024-06-19 ENCOUNTER — Encounter: Payer: Self-pay | Admitting: Internal Medicine

## 2024-06-22 ENCOUNTER — Other Ambulatory Visit (HOSPITAL_COMMUNITY): Payer: Self-pay

## 2024-06-28 ENCOUNTER — Encounter: Payer: Self-pay | Admitting: Internal Medicine

## 2024-06-28 ENCOUNTER — Ambulatory Visit (AMBULATORY_SURGERY_CENTER): Admitting: Internal Medicine

## 2024-06-28 VITALS — BP 127/81 | HR 73 | Temp 98.2°F | Resp 13 | Ht 66.25 in | Wt 247.0 lb

## 2024-06-28 DIAGNOSIS — Z860101 Personal history of adenomatous and serrated colon polyps: Secondary | ICD-10-CM

## 2024-06-28 DIAGNOSIS — Z860100 Personal history of colon polyps, unspecified: Secondary | ICD-10-CM

## 2024-06-28 DIAGNOSIS — K635 Polyp of colon: Secondary | ICD-10-CM | POA: Diagnosis not present

## 2024-06-28 DIAGNOSIS — D123 Benign neoplasm of transverse colon: Secondary | ICD-10-CM

## 2024-06-28 DIAGNOSIS — Z1211 Encounter for screening for malignant neoplasm of colon: Secondary | ICD-10-CM

## 2024-06-28 DIAGNOSIS — F329 Major depressive disorder, single episode, unspecified: Secondary | ICD-10-CM | POA: Diagnosis not present

## 2024-06-28 DIAGNOSIS — E785 Hyperlipidemia, unspecified: Secondary | ICD-10-CM | POA: Diagnosis not present

## 2024-06-28 DIAGNOSIS — E669 Obesity, unspecified: Secondary | ICD-10-CM | POA: Diagnosis not present

## 2024-06-28 DIAGNOSIS — G4730 Sleep apnea, unspecified: Secondary | ICD-10-CM | POA: Diagnosis not present

## 2024-06-28 MED ORDER — SODIUM CHLORIDE 0.9 % IV SOLN: 500.0000 mL | INTRAVENOUS | Status: AC

## 2024-06-28 NOTE — Patient Instructions (Signed)
 Resume previous diet.  Continue present medications.  Await pathology results.  Repeat colonoscopy date will be determined by pathology results.   YOU HAD AN ENDOSCOPIC PROCEDURE TODAY AT THE [facility]:   Refer to the procedure report that was given to you for any specific questions about what was found during the examination.  If the procedure report does not answer your questions, please call your gastroenterologist to clarify.  If you requested that your care partner not be given the details of your procedure findings, then the procedure report has been included in a sealed envelope for you to review at your convenience later.  YOU SHOULD EXPECT: Some feelings of bloating in the abdomen. Passage of more gas than usual.  Walking can help get rid of the air that was put into your GI tract during the procedure and reduce the bloating. If you had a lower endoscopy (such as a colonoscopy or flexible sigmoidoscopy) you may notice spotting of blood in your stool or on the toilet paper. If you underwent a bowel prep for your procedure, you may not have a normal bowel movement for a few days.  Please Note:  You might notice some irritation and congestion in your nose or some drainage.  This is from the oxygen used during your procedure.  There is no need for concern and it should clear up in a day or so.  SYMPTOMS TO REPORT IMMEDIATELY:  Following lower endoscopy (colonoscopy or flexible sigmoidoscopy):  Excessive amounts of blood in the stool  Significant tenderness or worsening of abdominal pains  Swelling of the abdomen that is new, acute  Fever of 100F or higher  For urgent or emergent issues, a gastroenterologist can be reached at any hour by calling [phone]. Do not use MyChart messaging for urgent concerns.    DIET:  We do recommend a small meal at first, but then you may proceed to your regular diet.  Drink plenty of fluids but you should avoid alcoholic beverages for  24 hours.  ACTIVITY:  You should plan to take it easy for the rest of today and you should NOT DRIVE or use heavy machinery until tomorrow (because of the sedation medicines used during the test).    FOLLOW UP: Our staff will call the number listed on your records the next business day following your procedure.  We will call around 7:15- 8:00 am to check on you and address any questions or concerns that you may have regarding the information given to you following your procedure. If we do not reach you, we will leave a message.     If any biopsies were taken you will be contacted by phone or by letter within the next 1-3 weeks.  Please call us  at [phone] if you have not heard about the biopsies in 3 weeks.    SIGNATURES/CONFIDENTIALITY: You and/or your care partner have signed paperwork which will be entered into your electronic medical record.  These signatures attest to the fact that that the information above on your After Visit Summary has been reviewed and is understood.  Full responsibility of the confidentiality of this discharge information lies with you and/or your care-partner.

## 2024-06-28 NOTE — Op Note (Signed)
 [facility] Patient Name: Aaron Malone Procedure Date: 06/28/2024 11:32 AM MRN: [id number] Endoscopist: Gordy CHRISTELLA Starch , MD, [id number] Age: 48 Referring MD:  Date of Birth: 1976-03-15 Gender: Male Account #: [id number] Procedure:                Colonoscopy Indications:              High risk colon cancer surveillance: Personal                            history of sessile serrated colon polyp (less than                            10 mm in size) with no dysplasia and tubular                            adenoma, Last colonoscopy: November 2020 (SSP x 1,                            TA x 1) Medicines:                Monitored Anesthesia Care Procedure:                Pre-Anesthesia Assessment:                           - Prior to the procedure, a History and Physical                            was performed, and patient medications and                            allergies were reviewed. The patient's tolerance of                            previous anesthesia was also reviewed. The risks                            and benefits of the procedure and the sedation                            options and risks were discussed with the patient.                            All questions were answered, and informed consent                            was obtained. Prior Anticoagulants: The patient has                            taken no anticoagulant or antiplatelet agents. ASA                            Grade Assessment: II - A patient with mild systemic  disease. After reviewing the risks and benefits,                            the patient was deemed in satisfactory condition to                            undergo the procedure.                           After obtaining informed consent, the colonoscope                            was passed under direct vision. Throughout the                            procedure, the patient's blood pressure, pulse, and                             oxygen saturations were monitored continuously. The                            Olympus CF-HQ190L ([id number]) Colonoscope was                            introduced through the anus and advanced to the                            cecum, identified by appendiceal orifice and                            ileocecal valve. The colonoscopy was performed                            without difficulty. The patient tolerated the                            procedure well. The quality of the bowel                            preparation was good. The ileocecal valve,                            appendiceal orifice, and rectum were photographed. Scope In: 11:39:41 AM Scope Out: 11:56:30 AM Scope Withdrawal Time: 0 hours 13 minutes 54 seconds  Total Procedure Duration: 0 hours 16 minutes 49 seconds  Findings:                 The digital rectal exam was normal.                           Two sessile polyps were found in the transverse                            colon. The polyps were 4 to 5 mm in size. These  polyps were removed with a cold snare. Resection                            and retrieval were complete.                           Internal hemorrhoids were found during                            retroflexion. The hemorrhoids were small.                           The exam was otherwise without abnormality. Complications:            No immediate complications. Estimated Blood Loss:     Estimated blood loss: none. Impression:               - Two 4 to 5 mm polyps in the transverse colon,                            removed with a cold snare. Resected and retrieved.                           - Internal hemorrhoids.                           - The examination was otherwise normal. Recommendation:           - Patient has a contact number available for                            emergencies. The signs and symptoms of potential                            delayed  complications were discussed with the                            patient. Return to normal activities tomorrow.                            Written discharge instructions were provided to the                            patient.                           - Resume previous diet.                           - Continue present medications.                           - Await pathology results.                           - Repeat colonoscopy is recommended for  surveillance. The colonoscopy date will be                            determined after pathology results from today's                            exam become available for review. Gordy CHRISTELLA Starch, MD 06/28/2024 12:00:04 PM This report has been signed electronically.

## 2024-06-28 NOTE — Progress Notes (Signed)
 Pt's states no medical or surgical changes since previsit or office visit.

## 2024-06-28 NOTE — Progress Notes (Signed)
 A/o x 3, VSS, good SR's, pleased with anesthesia, report to RN

## 2024-06-28 NOTE — Progress Notes (Signed)
 GASTROENTEROLOGY PROCEDURE H&P NOTE   Primary Care Physician: Avelina Greig BRAVO, MD    Reason for Procedure:  History of adenomatous and sessile serrated colon polyps  Plan:    Colonoscopy  Patient is appropriate for endoscopic procedure(s) in the ambulatory ([facility]) setting.  The nature of the procedure, as well as the risks, benefits, and alternatives were carefully and thoroughly reviewed with the patient. Ample time for discussion and questions allowed.  All questions were answered. The patient understood, was satisfied, and agreed with the plan to proceed.    HPI: Aaron Malone is a 48 y.o. male who presents for surveillance colonoscopy.  Medical history as below.  Tolerated the prep.  No recent chest pain or shortness of breath.  No abdominal pain today.  Past Medical History:  Diagnosis Date   Carpal tunnel syndrome    bilateral   COVID-19 08/2020   Gallstones 2012   History of kidney stones    Hyperlipidemia    Major depressive disorder, single episode, moderate (HCC)    Obesity    Sleep apnea    uses a CPAP    Past Surgical History:  Procedure Laterality Date   CARPAL TUNNEL RELEASE Bilateral 08/24/2014   Procedure: RIGHT LIMITED OPEN CARPAL TUNNEL RELEASE, LEFT CARPAL  TUNNEL INJECTION;  Surgeon: Elsie Mussel, MD;  Location: [facility];  Service: Orthopedics;  Laterality: Bilateral;   CARPAL TUNNEL RELEASE Left 06/20/2015   Procedure: LEFT CARPAL TUNNEL RELEASE;  Surgeon: Elsie Mussel, MD;  Location: [facility];  Service: Orthopedics;  Laterality: Left;   CHOLECYSTECTOMY  2012   COLONOSCOPY     CYSTOSCOPY W/ RETROGRADES Bilateral 12/16/2020   Procedure: CYSTOSCOPY WITH RETROGRADE PYELOGRAM;  Surgeon: Penne Knee, MD;  Location: [facility];  Service: Urology;  Laterality: Bilateral;   CYSTOSCOPY/URETEROSCOPY/HOLMIUM LASER/STENT PLACEMENT Right 12/16/2020   Procedure: CYSTOSCOPY/URETEROSCOPY/HOLMIUM LASER/STENT PLACEMENT;  Surgeon:  Penne Knee, MD;  Location: [facility];  Service: Urology;  Laterality: Right;   EXTRACORPOREAL SHOCK WAVE LITHOTRIPSY Right 10/31/2020   Procedure: EXTRACORPOREAL SHOCK WAVE LITHOTRIPSY (ESWL);  Surgeon: Penne Knee, MD;  Location: [facility];  Service: Urology;  Laterality: Right;   INGUINAL HERNIA REPAIR Bilateral 1983   LITHOTRIPSY  12/2003, 09/2006   TRIGGER FINGER RELEASE     thumb    Prior to Admission medications   Medication Sig Start Date End Date Taking? Authorizing Provider  Cholecalciferol (VITAMIN D -3) 125 MCG (5000 UT) TABS Take 1 tablet by mouth daily.   Yes [provider]  desvenlafaxine  (PRISTIQ ) 100 MG 24 hr tablet Take 1 tablet (100 mg total) by mouth daily. 03/16/24  Yes Bedsole, Amy E, MD  indapamide  (LOZOL ) 2.5 MG tablet Take 1 tablet (2.5 mg total) by mouth daily. 06/14/24  Yes Vaillancourt, Samantha, PA-C  MAGNESIUM GLUCONATE PO Take 400 mg by mouth daily.   Yes [provider]  Turmeric 450 MG CAPS Take 450 mg by mouth daily.   Yes [provider]  acetaminophen  (TYLENOL ) 500 MG tablet Take 500 mg by mouth every 6 (six) hours as needed.    [provider]  Azelastine HCl (ASTEPRO) 0.15 % SOLN Place 2 sprays into the nose daily as needed.    [provider]  celecoxib (CELEBREX) 100 MG capsule Take 1 capsule (100 mg total) by mouth 2 (two) times daily as needed for up to 14 days. 06/14/24 06/28/24  Vaillancourt, Samantha, PA-C  fexofenadine (ALLEGRA) 180 MG tablet Take 180 mg by mouth daily as needed  for allergies. 09/29/21   [provider]  fluticasone  (FLONASE ) 50 MCG/ACT nasal spray USE 2 SPRAYS IN EACH NOSTIL ONCE A DAY Patient taking differently: Place 2 sprays into both nostrils as needed. 11/11/20   Bedsole, Amy E, MD  ibuprofen (ADVIL,MOTRIN) 600 MG tablet Take 400 mg by mouth 2 (two) times daily. Patient taking differently: Take 400 mg by mouth as needed.    [provider]  Loteprednol  Etabonate  (LOTEMAX  SM) 0.38 % GEL Place 1 drop into affected eye 3 times a day Patient taking differently: as needed. 08/05/23     methocarbamol  (ROBAXIN ) 500 MG tablet Take 1 tablet by mouth 4 times a day for 5 days. Patient taking differently: Take 500 mg by mouth as needed. 08/11/22     propranolol  (INDERAL ) 10 MG tablet TAKE 1 TABLET BY MOUTH ONCE DAILY AS NEEDED 12/02/23 12/01/24  Bedsole, Amy E, MD  tamsulosin  (FLOMAX ) 0.4 MG CAPS capsule Take 1 capsule (0.4 mg total) by mouth daily. Take as needed for stone episodes. Patient taking differently: Take 0.4 mg by mouth as needed. Take as needed for stone episodes. 06/18/23   Vaillancourt, Samantha, PA-C    Current Outpatient Medications  Medication Sig Dispense Refill   Cholecalciferol (VITAMIN D -3) 125 MCG (5000 UT) TABS Take 1 tablet by mouth daily.     desvenlafaxine  (PRISTIQ ) 100 MG 24 hr tablet Take 1 tablet (100 mg total) by mouth daily. 90 tablet 1   indapamide  (LOZOL ) 2.5 MG tablet Take 1 tablet (2.5 mg total) by mouth daily. 90 tablet 3   MAGNESIUM GLUCONATE PO Take 400 mg by mouth daily.     Turmeric 450 MG CAPS Take 450 mg by mouth daily.     acetaminophen  (TYLENOL ) 500 MG tablet Take 500 mg by mouth every 6 (six) hours as needed.     Azelastine HCl (ASTEPRO) 0.15 % SOLN Place 2 sprays into the nose daily as needed.     celecoxib (CELEBREX) 100 MG capsule Take 1 capsule (100 mg total) by mouth 2 (two) times daily as needed for up to 14 days. 28 capsule 3   fexofenadine (ALLEGRA) 180 MG tablet Take 180 mg by mouth daily as needed for allergies.     fluticasone  (FLONASE ) 50 MCG/ACT nasal spray USE 2 SPRAYS IN EACH NOSTIL ONCE A DAY (Patient taking differently: Place 2 sprays into both nostrils as needed.) 48 g 3   ibuprofen (ADVIL,MOTRIN) 600 MG tablet Take 400 mg by mouth 2 (two) times daily. (Patient taking differently: Take 400 mg by mouth as needed.)     Loteprednol  Etabonate (LOTEMAX  SM) 0.38 % GEL Place 1 drop into affected eye 3 times a  day (Patient taking differently: as needed.) 5 g 0   methocarbamol  (ROBAXIN ) 500 MG tablet Take 1 tablet by mouth 4 times a day for 5 days. (Patient taking differently: Take 500 mg by mouth as needed.) 20 tablet 0   propranolol  (INDERAL ) 10 MG tablet TAKE 1 TABLET BY MOUTH ONCE DAILY AS NEEDED 90 tablet 1   tamsulosin  (FLOMAX ) 0.4 MG CAPS capsule Take 1 capsule (0.4 mg total) by mouth daily. Take as needed for stone episodes. (Patient taking differently: Take 0.4 mg by mouth as needed. Take as needed for stone episodes.) 30 capsule 2   Current Facility-Administered Medications  Medication Dose Route Frequency Provider Last Rate Last Admin   0.9 %  sodium chloride  infusion  500 mL Intravenous Continuous Jeoffrey Eleazer, Gordy HERO, MD  Allergies as of 06/28/2024   (No Known Allergies)    Family History  Problem Relation Age of Onset   Hyperlipidemia Mother    Hypertension Mother    Irritable bowel syndrome Mother    Colon polyps Mother        x lots   Polycystic ovary syndrome Sister    Colon polyps Maternal Uncle    Colon cancer Maternal Grandmother        dx in her 90's or 16's   Lung cancer Maternal Grandmother    Heart failure Maternal Grandfather    Heart disease Maternal Grandfather    Prostate cancer Maternal Grandfather    Diabetes Paternal Grandmother    Multiple myeloma Paternal Grandfather    Rectal cancer Neg Hx    Stomach cancer Neg Hx    Esophageal cancer Neg Hx     Social History   Socioeconomic History   Marital status: Married    Spouse name: Not on file   Number of children: 1   Years of education: Not on file   Highest education level: Professional school degree (e.g., MD, DDS, DVM, JD)  Occupational History   Occupation: Printmaker: [employer]  Tobacco Use   Smoking status: Never   Smokeless tobacco: Never  Vaping Use   Vaping status: Never Used  Substance and Sexual Activity   Alcohol use: Yes    Comment: occasionally   Drug use: No    Sexual activity: Not on file  Other Topics Concern   Not on file  Social History Narrative   Dorise is married and lives with wife and child. Currently works a teacher, early years/pre at [employer].    Social Drivers of Corporate Investment Banker Strain: Low Risk  (03/17/2024)   Overall Financial Resource Strain (CARDIA)    Difficulty of Paying Living Expenses: Not hard at all  Food Insecurity: No Food Insecurity (03/17/2024)   Hunger Vital Sign    Worried About Running Out of Food in the Last Year: Never true    Ran Out of Food in the Last Year: Never true  Transportation Needs: No Transportation Needs (03/17/2024)   PRAPARE - Administrator, Civil Service (Medical): No    Lack of Transportation (Non-Medical): No  Physical Activity: Insufficiently Active (03/17/2024)   Exercise Vital Sign    Days of Exercise per Week: 3 days    Minutes of Exercise per Session: 20 min  Stress: No Stress Concern Present (03/17/2024)   [employer] of Occupational Health - Occupational Stress Questionnaire    Feeling of Stress: Only a little  Social Connections: Moderately Isolated (03/17/2024)   Social Connection and Isolation Panel    Frequency of Communication with Friends and Family: Once a week    Frequency of Social Gatherings with Friends and Family: Once a week    Attends Religious Services: Never    Database Administrator or Organizations: Yes    Attends Engineer, Structural: 1 to 4 times per year    Marital Status: Married  Catering Manager Violence: Not on file    Physical Exam: Vital signs in last 24 hours: @BP  117/78   Pulse 90   Temp 98.2 F (36.8 C) (Temporal)   Ht 5' 6.25 (1.683 m)   Wt 247 lb (112 kg)   SpO2 96%   BMI 39.57 kg/m  GEN: NAD EYE: Sclerae anicteric ENT: MMM CV: Non-tachycardic Pulm: CTA b/l GI: Soft, NT/ND  NEURO:  Alert & Oriented x 3   Gordy Starch, MD [facility]  06/28/2024 11:33 AM

## 2024-06-28 NOTE — Progress Notes (Signed)
 Called to room to assist during endoscopic procedure.  Patient ID and intended procedure confirmed with present staff. Received instructions for my participation in the procedure from the performing physician.

## 2024-06-29 ENCOUNTER — Telehealth: Payer: Self-pay | Admitting: Lactation Services

## 2024-06-29 NOTE — Telephone Encounter (Signed)
 No answer left voice mail

## 2024-06-30 LAB — SURGICAL PATHOLOGY

## 2024-07-03 ENCOUNTER — Other Ambulatory Visit (HOSPITAL_COMMUNITY): Payer: Self-pay

## 2024-07-10 ENCOUNTER — Ambulatory Visit: Payer: Self-pay | Admitting: Internal Medicine

## 2024-07-16 ENCOUNTER — Other Ambulatory Visit (HOSPITAL_COMMUNITY): Payer: Self-pay

## 2024-07-26 ENCOUNTER — Other Ambulatory Visit (HOSPITAL_COMMUNITY): Payer: Self-pay

## 2024-09-12 ENCOUNTER — Other Ambulatory Visit: Payer: Self-pay | Admitting: Family Medicine

## 2024-09-12 ENCOUNTER — Other Ambulatory Visit: Payer: Self-pay

## 2024-09-12 ENCOUNTER — Other Ambulatory Visit (HOSPITAL_COMMUNITY): Payer: Self-pay

## 2024-09-12 MED ORDER — DESVENLAFAXINE SUCCINATE ER 100 MG PO TB24
100.0000 mg | ORAL_TABLET | Freq: Every day | ORAL | 0 refills | Status: AC
  Filled 2024-09-12: qty 90, 90d supply, fill #0

## 2025-06-14 ENCOUNTER — Ambulatory Visit: Admitting: Urology
